# Patient Record
Sex: Female | Born: 1940 | Race: White | Hispanic: No | Marital: Married | Smoking: Never smoker
Health system: Southern US, Community
[De-identification: ages and names within clinical notes are randomized; demographics above are authoritative.]

## PROBLEM LIST (undated history)

## (undated) DIAGNOSIS — K219 Gastro-esophageal reflux disease without esophagitis: Secondary | ICD-10-CM

## (undated) DIAGNOSIS — F419 Anxiety disorder, unspecified: Secondary | ICD-10-CM

## (undated) DIAGNOSIS — D649 Anemia, unspecified: Secondary | ICD-10-CM

## (undated) DIAGNOSIS — I499 Cardiac arrhythmia, unspecified: Secondary | ICD-10-CM

## (undated) DIAGNOSIS — I2510 Atherosclerotic heart disease of native coronary artery without angina pectoris: Secondary | ICD-10-CM

## (undated) DIAGNOSIS — I10 Essential (primary) hypertension: Secondary | ICD-10-CM

## (undated) DIAGNOSIS — M109 Gout, unspecified: Secondary | ICD-10-CM

## (undated) DIAGNOSIS — G4730 Sleep apnea, unspecified: Secondary | ICD-10-CM

## (undated) DIAGNOSIS — C801 Malignant (primary) neoplasm, unspecified: Secondary | ICD-10-CM

## (undated) DIAGNOSIS — H9190 Unspecified hearing loss, unspecified ear: Secondary | ICD-10-CM

## (undated) DIAGNOSIS — Z9289 Personal history of other medical treatment: Secondary | ICD-10-CM

## (undated) HISTORY — PX: COLONOSCOPY: SHX174

## (undated) HISTORY — PX: ABDOMINAL HYSTERECTOMY: SHX81

## (undated) HISTORY — PX: EYE SURGERY: SHX253

## (undated) HISTORY — PX: CARPAL TUNNEL RELEASE: SHX101

## (undated) HISTORY — PX: CARDIAC CATHETERIZATION: SHX172

## (undated) HISTORY — PX: OTHER SURGICAL HISTORY: SHX169

---
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
## 1982-04-14 HISTORY — PX: TUBAL LIGATION: SHX77

## 1985-04-14 HISTORY — PX: CHOLECYSTECTOMY: SHX55

## 2007-01-11 HISTORY — PX: INCONTINENCE SURGERY: SHX676

## 2008-10-10 HISTORY — PX: DILATION AND CURETTAGE OF UTERUS: SHX78

## 2010-04-26 HISTORY — PX: OTHER SURGICAL HISTORY: SHX169

## 2011-08-19 HISTORY — PX: HYSTEROSCOPY WITH D & C: SHX1775

## 2012-01-27 HISTORY — PX: OTHER SURGICAL HISTORY: SHX169

## 2013-10-18 HISTORY — PX: JOINT REPLACEMENT: SHX530

## 2014-04-14 HISTORY — PX: UPPER GI ENDOSCOPY: SHX6162

## 2014-08-14 HISTORY — PX: BREAST SURGERY: SHX581

## 2015-01-02 HISTORY — PX: CORONARY ANGIOPLASTY: SHX604

## 2015-01-02 HISTORY — PX: OTHER SURGICAL HISTORY: SHX169

## 2016-10-05 HISTORY — PX: BREAST SURGERY: SHX581

## 2017-11-16 HISTORY — PX: NECK SURGERY: SHX720

## 2019-02-23 ENCOUNTER — Other Ambulatory Visit: Payer: Self-pay | Admitting: Neurological Surgery

## 2019-03-24 NOTE — Progress Notes (Signed)
[place], [place] [address] Phone: [phone] Fax: [phone]      Your procedure is scheduled on March 29, 2019.  Report to [facility] Main Entrance "A" at 5:30 A.M., and check in at the Admitting office.  Call this number if you have problems the morning of surgery:  [phone]  Call [phone] if you have any questions prior to your surgery date Monday-Friday 8am-4pm    Remember:  Do not eat or drink after midnight the night before your surgery   Take these medicines the morning of surgery with A SIP OF WATER: allopurinol (ZYLOPRIM) BYSTOLIC  omeprazole (PRILOSEC) sertraline (ZOLOFT)   As of today, STOP taking any Aspirin (unless otherwise instructed by your surgeon), Aleve, Naproxen, Ibuprofen, Motrin, Advil, Goody's, BC's, all herbal medications, fish oil, and all vitamins.  Follow your surgeon's instructions on when to stop Coumadin.  If no instructions were given by your surgeon then you will need to call the office to get those instructions.      The Morning of Surgery  Do not wear jewelry, make-up or nail polish.  Do not wear lotions, powders, or perfumes/colognes, or deodorant  Do not shave 48 hours prior to surgery.  Men may shave face and neck.  Do not bring valuables to the hospital.  [facility] is not responsible for any belongings or valuables.  If you are a smoker, DO NOT Smoke 24 hours prior to surgery  If you wear a CPAP at night please bring your mask, tubing, and machine the morning of surgery   Remember that you must have someone to transport you home after your surgery, and remain with you for 24 hours if you are discharged the same day.   Please bring cases for contacts, glasses, hearing aids, dentures or bridgework because it cannot be worn into surgery.    Leave your suitcase in the car.  After surgery it may be brought to your room.  For patients admitted to  the hospital, discharge time will be determined by your treatment team.  Patients discharged the day of surgery will not be allowed to drive home.    Special instructions:   [place]- Preparing For Surgery  Before surgery, you can play an important role. Because skin is not sterile, your skin needs to be as free of germs as possible. You can reduce the number of germs on your skin by washing with CHG (chlorahexidine gluconate) Soap before surgery.  CHG is an antiseptic cleaner which kills germs and bonds with the skin to continue killing germs even after washing.    Oral Hygiene is also important to reduce your risk of infection.  Remember - BRUSH YOUR TEETH THE MORNING OF SURGERY WITH YOUR REGULAR TOOTHPASTE  Please do not use if you have an allergy to CHG or antibacterial soaps. If your skin becomes reddened/irritated stop using the CHG.  Do not shave (including legs and underarms) for at least 48 hours prior to first CHG shower. It is OK to shave your face.  Please follow these instructions carefully.   1. Shower the NIGHT BEFORE SURGERY and the MORNING OF SURGERY with CHG Soap.   2. If you chose to wash your hair, wash your hair first as usual with your normal shampoo.  3. After you shampoo, rinse your hair and body thoroughly to remove the shampoo.  4. Use CHG as you would any other liquid soap. You  can apply CHG directly to the skin and wash gently with a scrungie or a clean washcloth.   5. Apply the CHG Soap to your body ONLY FROM THE NECK DOWN.  Do not use on open wounds or open sores. Avoid contact with your eyes, ears, mouth and genitals (private parts). Wash Face and genitals (private parts)  with your normal soap.   6. Wash thoroughly, paying special attention to the area where your surgery will be performed.  7. Thoroughly rinse your body with warm water from the neck down.  8. DO NOT shower/wash with your normal soap after using and rinsing off the CHG Soap.  9. Pat  yourself dry with a CLEAN TOWEL.  10. Wear CLEAN PAJAMAS to bed the night before surgery, wear comfortable clothes the morning of surgery  11. Place CLEAN SHEETS on your bed the night of your first shower and DO NOT SLEEP WITH PETS.    Day of Surgery:  Please shower the morning of surgery with the CHG soap Do not apply any deodorants/lotions. Please wear clean clothes to the hospital/surgery center.   Remember to brush your teeth WITH YOUR REGULAR TOOTHPASTE.   Please read over the following fact sheets that you were given.

## 2019-03-25 ENCOUNTER — Encounter (HOSPITAL_COMMUNITY)
Admission: RE | Admit: 2019-03-25 | Discharge: 2019-03-25 | Disposition: A | Discharge status: Home / Self Care | Payer: Medicare Other | Source: Ambulatory Visit | Attending: Neurological Surgery | Admitting: Neurological Surgery

## 2019-03-25 ENCOUNTER — Encounter (HOSPITAL_COMMUNITY): Payer: Self-pay

## 2019-03-25 ENCOUNTER — Other Ambulatory Visit (HOSPITAL_COMMUNITY)
Admission: RE | Admit: 2019-03-25 | Discharge: 2019-03-25 | Disposition: A | Discharge status: Home / Self Care | Payer: Medicare Other | Source: Ambulatory Visit | Attending: Neurological Surgery | Admitting: Neurological Surgery

## 2019-03-25 ENCOUNTER — Other Ambulatory Visit: Payer: Self-pay

## 2019-03-25 DIAGNOSIS — Z7901 Long term (current) use of anticoagulants: Secondary | ICD-10-CM | POA: Insufficient documentation

## 2019-03-25 DIAGNOSIS — Z853 Personal history of malignant neoplasm of breast: Secondary | ICD-10-CM | POA: Insufficient documentation

## 2019-03-25 DIAGNOSIS — Z79899 Other long term (current) drug therapy: Secondary | ICD-10-CM | POA: Insufficient documentation

## 2019-03-25 DIAGNOSIS — F419 Anxiety disorder, unspecified: Secondary | ICD-10-CM | POA: Insufficient documentation

## 2019-03-25 DIAGNOSIS — M5000 Cervical disc disorder with myelopathy, unspecified cervical region: Secondary | ICD-10-CM | POA: Insufficient documentation

## 2019-03-25 DIAGNOSIS — Z9049 Acquired absence of other specified parts of digestive tract: Secondary | ICD-10-CM | POA: Insufficient documentation

## 2019-03-25 DIAGNOSIS — Z01818 Encounter for other preprocedural examination: Secondary | ICD-10-CM | POA: Insufficient documentation

## 2019-03-25 DIAGNOSIS — M109 Gout, unspecified: Secondary | ICD-10-CM | POA: Diagnosis not present

## 2019-03-25 DIAGNOSIS — K219 Gastro-esophageal reflux disease without esophagitis: Secondary | ICD-10-CM | POA: Insufficient documentation

## 2019-03-25 DIAGNOSIS — I10 Essential (primary) hypertension: Secondary | ICD-10-CM | POA: Diagnosis not present

## 2019-03-25 DIAGNOSIS — I2510 Atherosclerotic heart disease of native coronary artery without angina pectoris: Secondary | ICD-10-CM | POA: Diagnosis not present

## 2019-03-25 DIAGNOSIS — Z20828 Contact with and (suspected) exposure to other viral communicable diseases: Secondary | ICD-10-CM | POA: Insufficient documentation

## 2019-03-25 DIAGNOSIS — G4730 Sleep apnea, unspecified: Secondary | ICD-10-CM | POA: Insufficient documentation

## 2019-03-25 DIAGNOSIS — Z90710 Acquired absence of both cervix and uterus: Secondary | ICD-10-CM | POA: Diagnosis not present

## 2019-03-25 DIAGNOSIS — Z9851 Tubal ligation status: Secondary | ICD-10-CM | POA: Diagnosis not present

## 2019-03-25 HISTORY — DX: Cardiac arrhythmia, unspecified: I49.9

## 2019-03-25 HISTORY — DX: Gastro-esophageal reflux disease without esophagitis: K21.9

## 2019-03-25 HISTORY — DX: Unspecified hearing loss, unspecified ear: H91.90

## 2019-03-25 HISTORY — DX: Sleep apnea, unspecified: G47.30

## 2019-03-25 HISTORY — DX: Malignant (primary) neoplasm, unspecified: C80.1

## 2019-03-25 HISTORY — DX: Anxiety disorder, unspecified: F41.9

## 2019-03-25 HISTORY — DX: Essential (primary) hypertension: I10

## 2019-03-25 HISTORY — DX: Gout, unspecified: M10.9

## 2019-03-25 HISTORY — DX: Personal history of other medical treatment: Z92.89

## 2019-03-25 LAB — TYPE AND SCREEN
ABO/RH(D): A NEG
Antibody Screen: NEGATIVE

## 2019-03-25 LAB — BASIC METABOLIC PANEL
Anion gap: 8 (ref 5–15)
BUN: 24 mg/dL — ABNORMAL HIGH (ref 8–23)
CO2: 29 mmol/L (ref 22–32)
Calcium: 8.9 mg/dL (ref 8.9–10.3)
Chloride: 106 mmol/L (ref 98–111)
Creatinine, Ser: 0.97 mg/dL (ref 0.44–1.00)
GFR calc Af Amer: >60 mL/min (ref >60)
GFR calc non Af Amer: 57 mL/min — ABNORMAL LOW (ref >60)
Glucose, Bld: 111 mg/dL — ABNORMAL HIGH (ref 70–99)
Potassium: 3.2 mmol/L — ABNORMAL LOW (ref 3.5–5.1)
Sodium: 143 mmol/L (ref 135–145)

## 2019-03-25 LAB — CBC
HCT: 35.1 % — ABNORMAL LOW (ref 36.0–46.0)
Hemoglobin: 11.3 g/dL — ABNORMAL LOW (ref 12.0–15.0)
MCH: 32.8 pg (ref 26.0–34.0)
MCHC: 32.2 g/dL (ref 30.0–36.0)
MCV: 102 fL — ABNORMAL HIGH (ref 80.0–100.0)
Platelets: 146 10*3/uL — ABNORMAL LOW (ref 150–400)
RBC: 3.44 MIL/uL — ABNORMAL LOW (ref 3.87–5.11)
RDW: 13.8 % (ref 11.5–15.5)
WBC: 5.6 10*3/uL (ref 4.0–10.5)
nRBC: 0 % (ref 0.0–0.2)

## 2019-03-25 LAB — SURGICAL PCR SCREEN
MRSA, PCR: NEGATIVE
Staphylococcus aureus: NEGATIVE

## 2019-03-25 LAB — GLUCOSE, CAPILLARY: Glucose-Capillary: 113 mg/dL — ABNORMAL HIGH (ref 70–99)

## 2019-03-25 LAB — ABO/RH: ABO/RH(D): A NEG

## 2019-03-25 NOTE — Progress Notes (Signed)
Patient denies shortness of breath, fever, cough and chest pain.  PCP - Dr Moshe Cipro Cardiologist -  Dr Carrolyn Leigh Pulmonology - Maudie Flakes, PA/ Dr Koleen Nimrod  Chest x-ray -  EKG - 03/25/19 [place] 10/03/16 [place], [place] Cardiac Cath - 01/02/2015 [facility]  Sleep Study - Yes.  [place], [place] CPAP -  Uses CPAP nightly, Bring CPAP with you on DOS.  Blood Thinner Instructions:  Follow your surgeon's instructions on when to stop prior to surgery.  Coumadin last dose on Thursday, 03/24/19.  Anesthesia review: Yes  Coronavirus Screening Have you experienced the following symptoms:  Cough yes/no: No Fever (>100.9F)  yes/no: No Runny nose yes/no: No Sore throat yes/no: No Difficulty breathing/shortness of breath  yes/no: No  Have you traveled in the last 14 days and where? yes/no: No  Patient verbalized understanding of instructions that were given to them at the PAT appointment.

## 2019-03-26 LAB — NOVEL CORONAVIRUS, NAA (HOSP ORDER, SEND-OUT TO REF LAB; TAT 18-24 HRS): SARS-CoV-2, NAA: NOT DETECTED

## 2019-03-28 NOTE — Anesthesia Preprocedure Evaluation (Addendum)
Anesthesia Evaluation  Patient identified by MRN, date of birth, ID band Patient awake    Reviewed: Allergy & Precautions, NPO status , Patient's Chart, lab work & pertinent test results, reviewed documented beta blocker date and time   History of Anesthesia Complications Negative for: history of anesthetic complications  Airway Mallampati: I  TM Distance: >3 FB Neck ROM: Full    Dental  (+) Teeth Intact, Dental Advisory Given   Pulmonary sleep apnea and Continuous Positive Airway Pressure Ventilation ,    Pulmonary exam normal breath sounds clear to auscultation       Cardiovascular hypertension, Pt. on home beta blockers (-) angina+ CAD and + Cardiac Stents (LAD-2016)  (-) Past MI Normal cardiovascular exam+ dysrhythmias Atrial Fibrillation  Rhythm:Regular Rate:Normal  CV: TTE 10/03/2016 (copy on patient chart): Description: 1.  Normal left ventricular systolic function with an ejection fraction of estimated 60%. 2.  Grossly normal left ventricular filling pattern and estimated left ventricular filling pressures at the time of study, however, left atrial dilatation is present.  This patient has a history of atrial fibrillation. 3.  Normal right ventricular size and systolic function with normal estimated right atrial pressure. 4.  Normal estimated right ventricular systolic pressure. 5.  Mitral, tricuspid, aortic, and pulmonic valves are all structurally normal with trivial mitral and tricuspid insufficiencies. 6.  Left atrial volume index is 35. 7.  No pericardial effusion.  No intracardiac mass/thrombus.   Neuro/Psych PSYCHIATRIC DISORDERS Anxiety Cervical myelopathy    GI/Hepatic Neg liver ROS, GERD  Medicated and Controlled,  Endo/Other  Obesity   Renal/GU negative Renal ROS     Musculoskeletal negative musculoskeletal ROS (+)   Abdominal   Peds  Hematology  (+) Blood dyscrasia (Thrombocytopenia--Plt 146k;  Coumadin), anemia ,   Anesthesia Other Findings Day of surgery medications reviewed with the patient.  Reproductive/Obstetrics                          Anesthesia Physical Anesthesia Plan  ASA: III  Anesthesia Plan: General   Post-op Pain Management:    Induction: Intravenous  PONV Risk Score and Plan: 3 and Dexamethasone, Ondansetron, Treatment may vary due to age or medical condition and TIVA  Airway Management Planned: Oral ETT  Additional Equipment: Arterial line  Intra-op Plan:   Post-operative Plan: Extubation in OR  Informed Consent: I have reviewed the patients History and Physical, chart, labs and discussed the procedure including the risks, benefits and alternatives for the proposed anesthesia with the patient or authorized representative who has indicated his/her understanding and acceptance.     Dental advisory given  Plan Discussed with: CRNA  Anesthesia Plan Comments: (See PAT note by Karoline Caldwell, PA-C  2nd PIV after induction  TIVA per surgeon request)    Anesthesia Quick Evaluation

## 2019-03-28 NOTE — Progress Notes (Signed)
Anesthesia Chart Review:  Case: [id number] Date/Time: 03/29/19 0715   Procedure: Cervical 5 to Cervical 7 Posterior cervical laminectomy and instrumented fusion (N/A ) - Cervical 5 to Cervical 7 Posterior cervical laminectomy and instrumented fusion   Anesthesia type: General   Pre-op diagnosis: Cervical myelopathy   Location: [facility] OR ROOM 19 / [place] OR   Surgeons: Judith Part, MD      DISCUSSION:  Follows with cardiologist Dr. Gibson Ramp in [place] for hx of CAD s/p stent to LAD 2016. Subsequent cath in 2017 showed patent stent.  Echo done 10/03/2016 showed EF 60%, no significant valvular abnormalities. OV notes requested x 3 without success. Only received procedure notes from multiple vein ablation procedures.  Pt has hx of afib followed by PCP, maintained on coumadin. Surgical clearance states pt is low risk and can hold warfarin 5d preop.  Called Dr. Colleen Can office to clarify acuity of surgery and was told that pt is myelopathic and having difficulty ambulating. Discussed pt with Dr. Glennon Mac. She advised that given pt's myelopathy, can proceed as planned with the understanding that pt will be evaluated by anesthesia on DOS and if she has any new/concerning CV symptoms may be cancelled.   VS: BP (!) 173/73   Pulse 66   Temp (!) 36.3 C (Oral)   Resp 18   Ht 5\' 7"  (1.702 m)   Wt 112.3 kg   LMP  (LMP Unknown)   SpO2 97%   BMI 38.77 kg/m   PROVIDERS: Moshe Cipro, MD is PCP  Carrolyn Leigh, MD is Cardiologist  LABS: Labs reviewed: Acceptable for surgery. (all labs ordered are listed, but only abnormal results are displayed)  Labs Reviewed  GLUCOSE, CAPILLARY - Abnormal; Notable for the following components:      Result Value   Glucose-Capillary 113 (*)    All other components within normal limits  CBC - Abnormal; Notable for the following components:   RBC 3.44 (*)    Hemoglobin 11.3 (*)    HCT 35.1 (*)    MCV 102.0 (*)    Platelets 146 (*)    All other components  within normal limits  BASIC METABOLIC PANEL - Abnormal; Notable for the following components:   Potassium 3.2 (*)    Glucose, Bld 111 (*)    BUN 24 (*)    GFR calc non Af Amer 57 (*)    All other components within normal limits  SURGICAL PCR SCREEN  TYPE AND SCREEN  ABO/RH      EKG: EKG 03/25/19: Normal sinus rhythm. Rate 65. Nonspecific ST and T wave abnormality  CV: TTE 10/03/2016 (copy on patient chart): Description: 1.  Normal left ventricular systolic function with an ejection fraction of estimated 60%. 2.  Grossly normal left ventricular filling pattern and estimated left ventricular filling pressures at the time of study, however, left atrial dilatation is present.  This patient has a history of atrial fibrillation. 3.  Normal right ventricular size and systolic function with normal estimated right atrial pressure. 4.  Normal estimated right ventricular systolic pressure. 5.  Mitral, tricuspid, aortic, and pulmonic valves are all structurally normal with trivial mitral and tricuspid insufficiencies. 6.  Left atrial volume index is 35. 7.  No pericardial effusion.  No intracardiac mass/thrombus.  R/L Cath 08/03/2015 (copy on chart): Findings: 1.  Successful ultrasound access to the left radial artery. 2.  Distal abdominal aortography reveals patent distal abdominal aorta.  No evidence of aneurysm.  There are patent bilateral iliac systems,  bilateral femoral systems, bilateral popliteal arteries, and three-vessel runoff to the feet bilaterally with no obstructive peripheral artery disease. 3.  Right heart catheterization. A.  Pulmonary capillary wedge pressure is 20 mmHg. B.  Pulmonary artery pressure is 38 mmHg/ 19 mmHg with a mean at 25 mmHg. C.  Right ventricular pressure is 34 mmHg with an end-diastolic pressure of 8 mmHg. D.  Right atrial pressure is 8 mmHg. E.  Cardiac output is 5.49 L/min. F.  Cardiac index is 2.56 L/min/m. 4.  RCA is a large dominant vessel, free of  disease. 5.  Left main coronary artery bifurcates, free of disease. 6.  LAD is a large vessel, wraps around and supplies the apex.  There is a patent stent in the proximal portion of the LAD.  In the midportion of the stent there is a small to moderate sized first diagonal vessel, that has a 90% ostial stenosis.  The vessel itself is between 1 mm and 1.5 mm in size. 7.  Circumflex is a large vessel, free of disease. Recommendations: Medical management.  Cath/PCI 01/02/2015 (copy on chart): Findings: 1.  Right coronary artery is a large vessel, free of angiographically significant disease. 2.  Left main coronary artery is a moderate-sized vessel, bifurcates, free of disease. 3.  LAD is a large vessel, wraps around, and supplies the apex.  There is a 10% to 20% stenosis in the very proximal portion of the vessel.  Just distal to this in the proximal portion of the LAD, there is an 80% stenosis.  This then gives off a small diagonal (less than 2 mm in size and has a 90% ostial stenosis).  The remainder of the LAD is free of angiographically significant disease. 4.  Circumflex is a large vessel with a 20% stenosis of the proximal portion of the vessel. 5.  LVEDP is elevated at 20 mmHg. 6.  No aortic stenosis on pullback across the aortic valve. 7.  PTCI of the LAD with a 3 x 15 mm resolute drug-eluting stent. 8.  Ultrasound access of the left radial artery. Recommendations: Triple therapy with aspirin, Coumadin, and Brilinta for 1 month; after which time, we discontinue the aspirin and continue Brilinta and Coumadin for a years time.  Past Medical History:  Diagnosis Date  . Anxiety   . Cancer [facility])    breast cancer right  . Dysrhythmia    A-Fib on coumadin  . GERD (gastroesophageal reflux disease)   . Gout   . 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 History of blood transfusion   . Hypertension   . Sleep apnea    uses CPAP nightly    Past Surgical History:  Procedure Laterality Date   . ABDOMINAL HYSTERECTOMY    . BREAST SURGERY Right 08/14/2014   lumpectomy/lynph nodes  . BREAST SURGERY Right 10/05/2016   mass removed  . CARDIAC CATHETERIZATION    . CARPAL TUNNEL RELEASE Bilateral 10/01/11, 12/02/11  . CHOLECYSTECTOMY  1987  . COLONOSCOPY    . DILATION AND CURETTAGE OF UTERUS  10/10/2008   hysteroscopy with biopsy  . EYE SURGERY Bilateral    removed cataracts  . HYSTEROSCOPY W/D&C  08/19/2011  . INCONTINENCE SURGERY  01/11/2007  . JOINT REPLACEMENT Right 10/18/2013  . laparoscopy bilateral salpingo-oophorectomy  01/27/2012  . NECK SURGERY  11/16/2017  . pci lad  01/02/2015   stent   . radiation breast canceer Right    from 12/08/14-01/12/15  . rotator cuff  Right 04/26/2010   repair  . TONSILLECTOMY  1954  . TUBAL LIGATION  1984  . UPPER GI ENDOSCOPY  2016    MEDICATIONS: . allopurinol (ZYLOPRIM) 300 MG tablet  . BYSTOLIC 20 MG TABS  . omeprazole (PRILOSEC) 20 MG capsule  . potassium chloride (MICRO-K) 10 MEQ CR capsule  . sertraline (ZOLOFT) 100 MG tablet  . torsemide (DEMADEX) 20 MG tablet  . Vitamin D, Ergocalciferol, (DRISDOL) 1.25 MG (50000 UT) CAPS capsule  . warfarin (COUMADIN) 2 MG tablet  . warfarin (COUMADIN) 3 MG tablet   No current facility-administered medications for this encounter.    Wynonia Musty [facility] Short Stay Center/Anesthesiology Phone [phone] 03/28/2019 4:05 PM

## 2019-03-29 ENCOUNTER — Inpatient Hospital Stay (HOSPITAL_COMMUNITY): Payer: Medicare Other | Admitting: Physician Assistant

## 2019-03-29 ENCOUNTER — Inpatient Hospital Stay (HOSPITAL_COMMUNITY)
Admission: RE | Admit: 2019-03-29 | Discharge: 2019-03-31 | DRG: 030 | Disposition: A | Discharge status: Home / Self Care | Payer: Medicare Other | Attending: Neurological Surgery | Admitting: Neurological Surgery

## 2019-03-29 ENCOUNTER — Encounter (HOSPITAL_COMMUNITY): Payer: Self-pay | Admitting: Neurological Surgery

## 2019-03-29 ENCOUNTER — Inpatient Hospital Stay (HOSPITAL_COMMUNITY): Payer: Medicare Other | Admitting: Anesthesiology

## 2019-03-29 ENCOUNTER — Other Ambulatory Visit: Payer: Self-pay

## 2019-03-29 ENCOUNTER — Encounter (HOSPITAL_COMMUNITY)
Admission: RE | Disposition: A | Discharge status: Home / Self Care | Payer: Self-pay | Source: Home / Self Care | Attending: Neurological Surgery

## 2019-03-29 ENCOUNTER — Inpatient Hospital Stay (HOSPITAL_COMMUNITY): Payer: Medicare Other

## 2019-03-29 DIAGNOSIS — G959 Disease of spinal cord, unspecified: Secondary | ICD-10-CM | POA: Diagnosis present

## 2019-03-29 DIAGNOSIS — Z853 Personal history of malignant neoplasm of breast: Secondary | ICD-10-CM | POA: Diagnosis not present

## 2019-03-29 DIAGNOSIS — K219 Gastro-esophageal reflux disease without esophagitis: Secondary | ICD-10-CM | POA: Diagnosis present

## 2019-03-29 DIAGNOSIS — Z79899 Other long term (current) drug therapy: Secondary | ICD-10-CM

## 2019-03-29 DIAGNOSIS — H9190 Unspecified hearing loss, unspecified ear: Secondary | ICD-10-CM | POA: Diagnosis present

## 2019-03-29 DIAGNOSIS — I4891 Unspecified atrial fibrillation: Secondary | ICD-10-CM | POA: Diagnosis present

## 2019-03-29 DIAGNOSIS — Z419 Encounter for procedure for purposes other than remedying health state, unspecified: Secondary | ICD-10-CM

## 2019-03-29 DIAGNOSIS — I10 Essential (primary) hypertension: Secondary | ICD-10-CM | POA: Diagnosis present

## 2019-03-29 DIAGNOSIS — Z20828 Contact with and (suspected) exposure to other viral communicable diseases: Secondary | ICD-10-CM | POA: Diagnosis present

## 2019-03-29 DIAGNOSIS — Z7901 Long term (current) use of anticoagulants: Secondary | ICD-10-CM | POA: Diagnosis not present

## 2019-03-29 DIAGNOSIS — M109 Gout, unspecified: Secondary | ICD-10-CM | POA: Diagnosis present

## 2019-03-29 HISTORY — PX: POSTERIOR CERVICAL FUSION/FORAMINOTOMY: SHX5038

## 2019-03-29 IMAGING — RF DG CERVICAL SPINE 2 OR 3 VIEWS
1 series · 2 of 2 positions shown · non-contrast
Comparison: None.

CLINICAL DATA: Portable imaging for C5 through C7 posterior fusion.

EXAM:
CERVICAL SPINE - 2-3 VIEW; DG C-ARM 1-60 MIN

[Series 1: run · 2 of 2 slices shown]
[im 1/2]
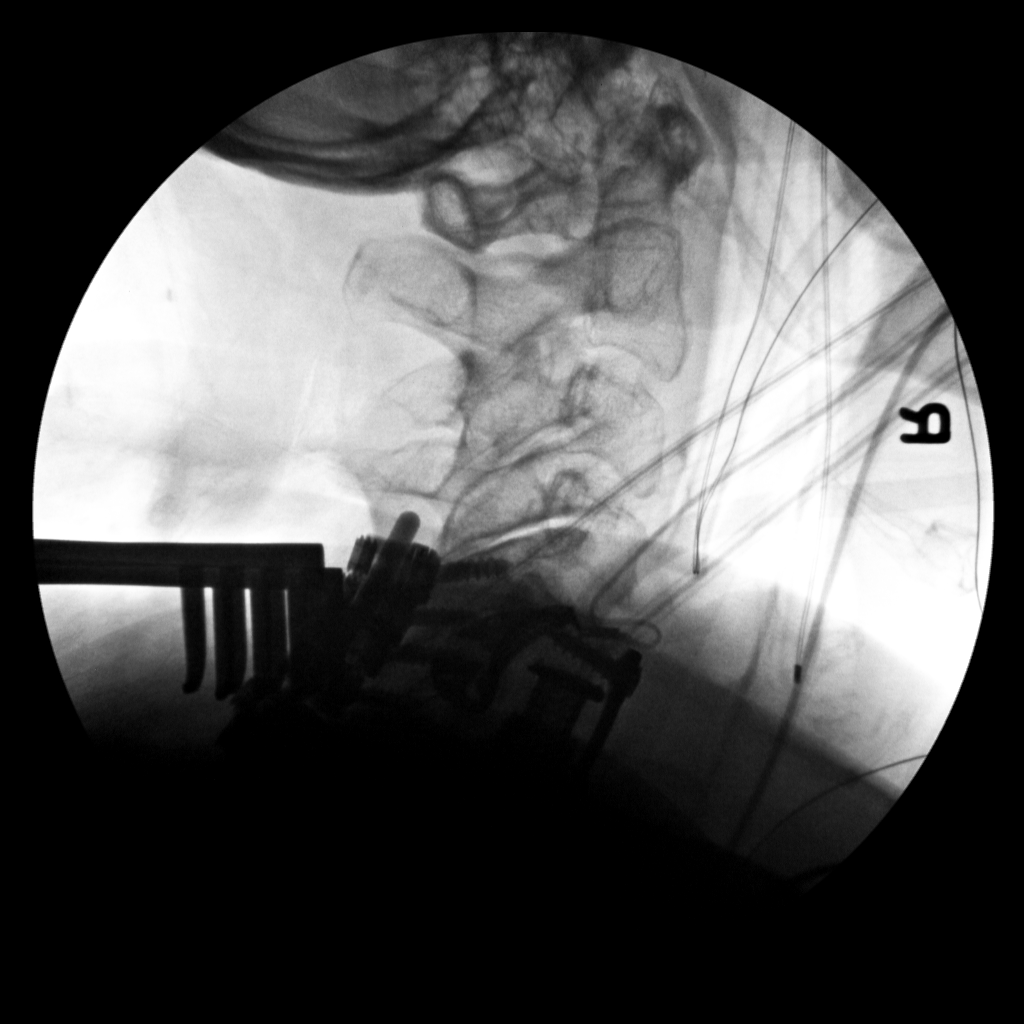
[im 2/2]
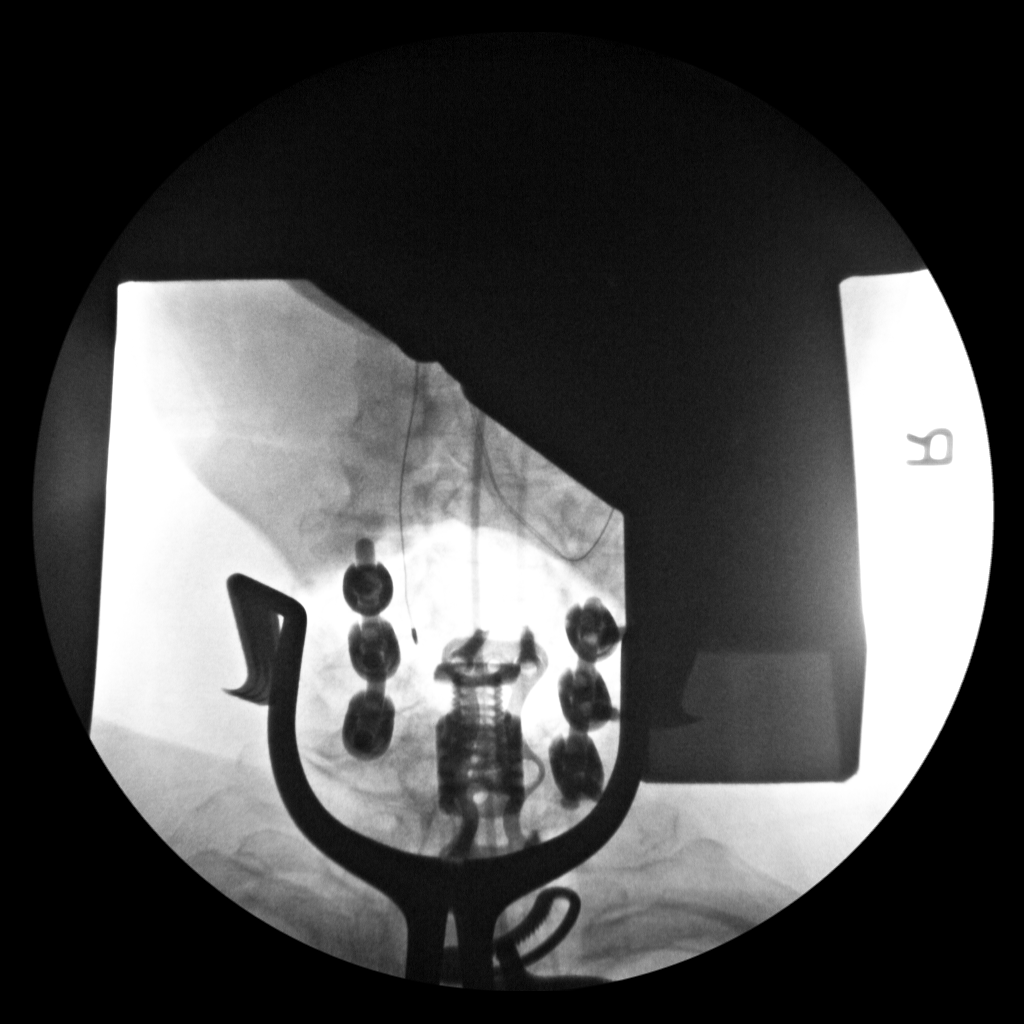

[2 of 2 positions shown; findings below may reference images not displayed]

FINDINGS: Two submitted images show placement pedicle screws with
interconnecting rods from C5-C7. There is also an anterior fusion
plate with an interbody device that is partly imaged, extending from
below C5.
IMPRESSION: Portable imaging provided for C5 through C7 posterior cervical
fusion.

## 2019-03-29 IMAGING — RF DG C-ARM 1-60 MIN
1 series · 2 of 2 positions shown · non-contrast
Comparison: None.

CLINICAL DATA: Portable imaging for C5 through C7 posterior fusion.

EXAM:
CERVICAL SPINE - 2-3 VIEW; DG C-ARM 1-60 MIN

[Series 1: run · 2 of 2 slices shown]
[im 1/2]
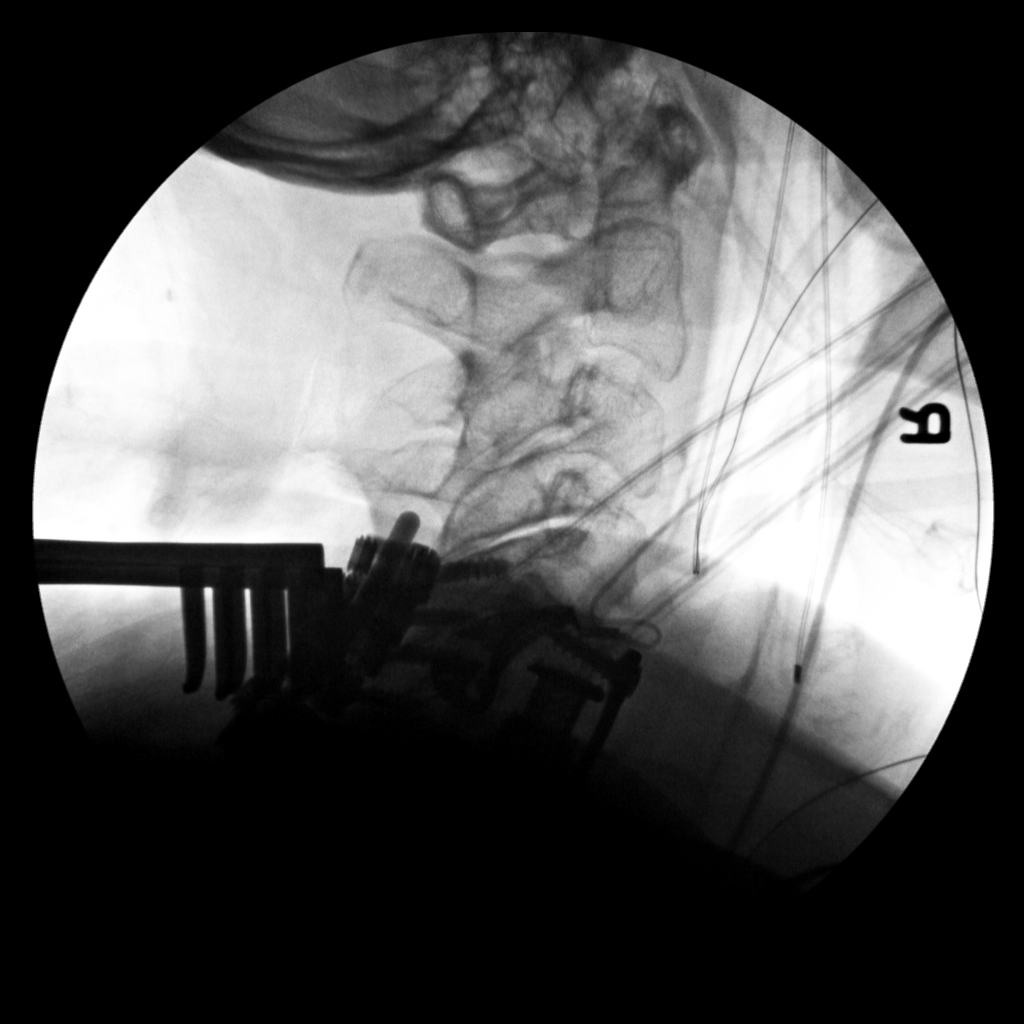
[im 2/2]
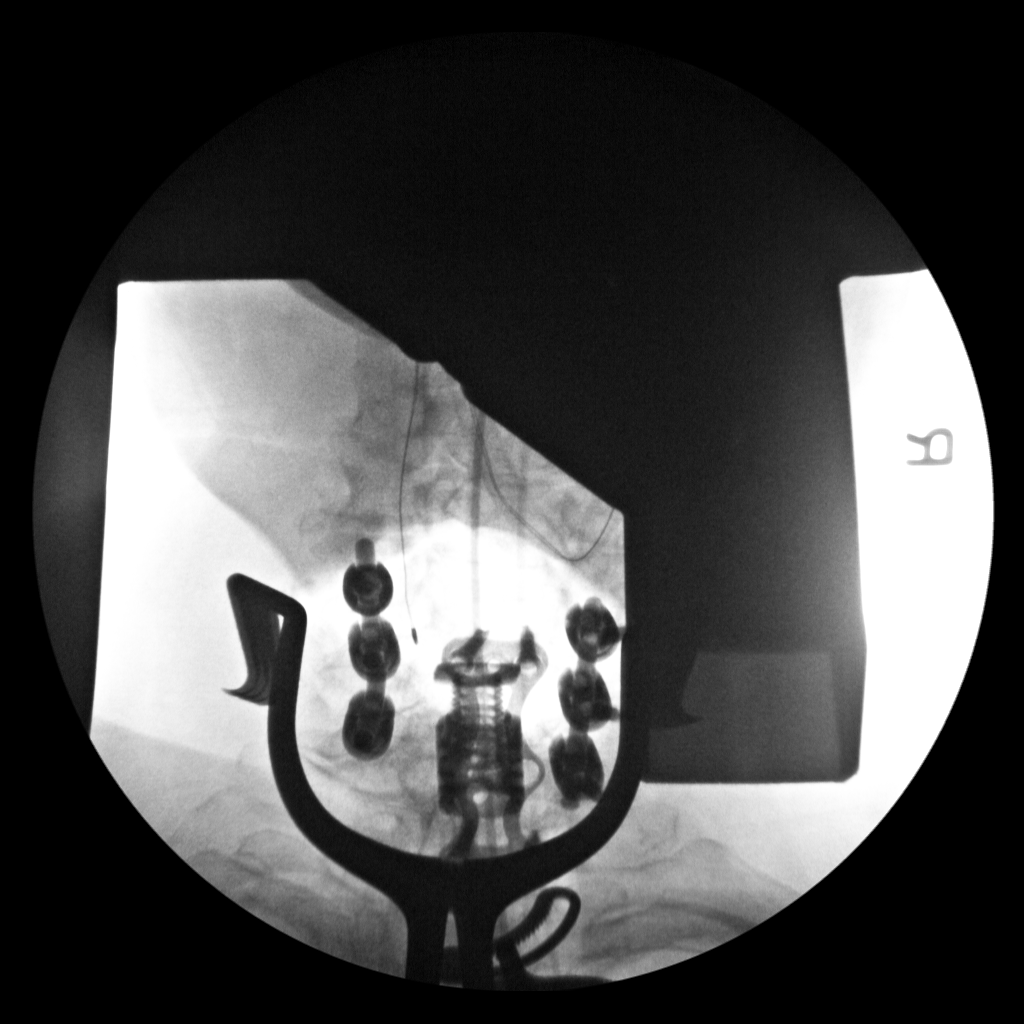

[2 of 2 positions shown; findings below may reference images not displayed]

FINDINGS: Two submitted images show placement pedicle screws with
interconnecting rods from C5-C7. There is also an anterior fusion
plate with an interbody device that is partly imaged, extending from
below C5.
IMPRESSION: Portable imaging provided for C5 through C7 posterior cervical
fusion.

## 2019-03-29 SURGERY — POSTERIOR CERVICAL FUSION/FORAMINOTOMY LEVEL 2
Anesthesia: General

## 2019-03-29 MED ORDER — SODIUM CHLORIDE 0.9% FLUSH
3.0000 mL | Freq: Two times a day (BID) | INTRAVENOUS | Status: DC
  Administered 2019-03-29: 3 mL via INTRAVENOUS

## 2019-03-29 MED ORDER — SODIUM CHLORIDE 0.9 % IV SOLN
0.0500 ug/kg/min | INTRAVENOUS | Status: AC
  Administered 2019-03-29: .15 ug/kg/min via INTRAVENOUS
  Filled 2019-03-29: qty 4000

## 2019-03-29 MED ORDER — DOCUSATE SODIUM 100 MG PO CAPS
100.0000 mg | ORAL_CAPSULE | Freq: Two times a day (BID) | ORAL | Status: DC
  Administered 2019-03-29 – 2019-03-31 (×5): 100 mg via ORAL
  Filled 2019-03-29 (×5): qty 1

## 2019-03-29 MED ORDER — EPHEDRINE SULFATE-NACL 50-0.9 MG/10ML-% IV SOSY
PREFILLED_SYRINGE | INTRAVENOUS | Status: DC | PRN
  Administered 2019-03-29: 15 mg via INTRAVENOUS
  Administered 2019-03-29: 10 mg via INTRAVENOUS

## 2019-03-29 MED ORDER — SERTRALINE HCL 50 MG PO TABS
100.0000 mg | ORAL_TABLET | Freq: Every day | ORAL | Status: DC
  Administered 2019-03-30 – 2019-03-31 (×2): 100 mg via ORAL
  Filled 2019-03-29 (×2): qty 2

## 2019-03-29 MED ORDER — CYCLOBENZAPRINE HCL 10 MG PO TABS
10.0000 mg | ORAL_TABLET | Freq: Three times a day (TID) | ORAL | Status: DC | PRN
  Administered 2019-03-29 – 2019-03-30 (×3): 10 mg via ORAL
  Filled 2019-03-29 (×4): qty 1

## 2019-03-29 MED ORDER — LIDOCAINE-EPINEPHRINE 1 %-1:100000 IJ SOLN
INTRAMUSCULAR | Status: AC
  Filled 2019-03-29: qty 1

## 2019-03-29 MED ORDER — LIDOCAINE 2% (20 MG/ML) 5 ML SYRINGE
INTRAMUSCULAR | Status: DC | PRN
  Administered 2019-03-29: 80 mg via INTRAVENOUS

## 2019-03-29 MED ORDER — MIDAZOLAM HCL 2 MG/2ML IJ SOLN
INTRAMUSCULAR | Status: DC | PRN
  Administered 2019-03-29 (×2): 1 mg via INTRAVENOUS

## 2019-03-29 MED ORDER — CHLORHEXIDINE GLUCONATE CLOTH 2 % EX PADS: 6.0000 | MEDICATED_PAD | Freq: Once | CUTANEOUS | Status: DC

## 2019-03-29 MED ORDER — ONDANSETRON HCL 4 MG/2ML IJ SOLN: 4.0000 mg | Freq: Once | INTRAMUSCULAR | Status: DC | PRN

## 2019-03-29 MED ORDER — PHENYLEPHRINE HCL-NACL 10-0.9 MG/250ML-% IV SOLN
INTRAVENOUS | Status: DC | PRN
  Administered 2019-03-29: 30 ug/min via INTRAVENOUS

## 2019-03-29 MED ORDER — ACETAMINOPHEN 325 MG PO TABS
650.0000 mg | ORAL_TABLET | ORAL | Status: DC | PRN
  Administered 2019-03-31: 650 mg via ORAL
  Filled 2019-03-29: qty 2

## 2019-03-29 MED ORDER — DEXAMETHASONE SODIUM PHOSPHATE 10 MG/ML IJ SOLN
INTRAMUSCULAR | Status: DC | PRN
  Administered 2019-03-29: 10 mg via INTRAVENOUS

## 2019-03-29 MED ORDER — 0.9 % SODIUM CHLORIDE (POUR BTL) OPTIME
TOPICAL | Status: DC | PRN
  Administered 2019-03-29: 09:00:00 1000 mL

## 2019-03-29 MED ORDER — VITAMIN D (ERGOCALCIFEROL) 1.25 MG (50000 UNIT) PO CAPS: 50000.0000 [IU] | ORAL_CAPSULE | ORAL | Status: DC

## 2019-03-29 MED ORDER — ONDANSETRON HCL 4 MG/2ML IJ SOLN
INTRAMUSCULAR | Status: DC | PRN
  Administered 2019-03-29: 4 mg via INTRAVENOUS

## 2019-03-29 MED ORDER — BACITRACIN ZINC 500 UNIT/GM EX OINT
TOPICAL_OINTMENT | CUTANEOUS | Status: DC | PRN
  Administered 2019-03-29: 1 via TOPICAL

## 2019-03-29 MED ORDER — LACTATED RINGERS IV SOLN: INTRAVENOUS | Status: DC | PRN

## 2019-03-29 MED ORDER — PROPOFOL 500 MG/50ML IV EMUL
INTRAVENOUS | Status: DC | PRN
  Administered 2019-03-29: 50 ug/kg/min via INTRAVENOUS

## 2019-03-29 MED ORDER — HYDROMORPHONE HCL 1 MG/ML IJ SOLN
INTRAMUSCULAR | Status: AC
  Filled 2019-03-29: qty 1

## 2019-03-29 MED ORDER — SODIUM CHLORIDE 0.9% FLUSH: 3.0000 mL | INTRAVENOUS | Status: DC | PRN

## 2019-03-29 MED ORDER — PROPOFOL 10 MG/ML IV BOLUS
INTRAVENOUS | Status: AC
  Filled 2019-03-29: qty 20

## 2019-03-29 MED ORDER — POLYETHYLENE GLYCOL 3350 17 G PO PACK
17.0000 g | PACK | Freq: Every day | ORAL | Status: DC | PRN
  Administered 2019-03-30: 17 g via ORAL
  Filled 2019-03-29: qty 1

## 2019-03-29 MED ORDER — MIDAZOLAM HCL 2 MG/2ML IJ SOLN
INTRAMUSCULAR | Status: AC
  Filled 2019-03-29: qty 2

## 2019-03-29 MED ORDER — ONDANSETRON HCL 4 MG/2ML IJ SOLN: 4.0000 mg | Freq: Four times a day (QID) | INTRAMUSCULAR | Status: DC | PRN

## 2019-03-29 MED ORDER — ACETAMINOPHEN 650 MG RE SUPP: 650.0000 mg | RECTAL | Status: DC | PRN

## 2019-03-29 MED ORDER — CEFAZOLIN SODIUM-DEXTROSE 2-4 GM/100ML-% IV SOLN
2.0000 g | Freq: Three times a day (TID) | INTRAVENOUS | Status: AC
  Administered 2019-03-29 (×2): 2 g via INTRAVENOUS
  Filled 2019-03-29 (×2): qty 100

## 2019-03-29 MED ORDER — POTASSIUM CHLORIDE CRYS ER 20 MEQ PO TBCR
10.0000 meq | EXTENDED_RELEASE_TABLET | Freq: Two times a day (BID) | ORAL | Status: DC
  Administered 2019-03-29 – 2019-03-31 (×5): 10 meq via ORAL
  Filled 2019-03-29 (×4): qty 1

## 2019-03-29 MED ORDER — ALLOPURINOL 300 MG PO TABS
300.0000 mg | ORAL_TABLET | Freq: Every day | ORAL | Status: DC
  Administered 2019-03-30 – 2019-03-31 (×2): 300 mg via ORAL
  Filled 2019-03-29 (×2): qty 1

## 2019-03-29 MED ORDER — FENTANYL CITRATE (PF) 250 MCG/5ML IJ SOLN
INTRAMUSCULAR | Status: AC
  Filled 2019-03-29: qty 5

## 2019-03-29 MED ORDER — SODIUM CHLORIDE 0.9 % IV SOLN
INTRAVENOUS | Status: DC | PRN
  Administered 2019-03-29: 500 mL

## 2019-03-29 MED ORDER — OXYCODONE HCL 5 MG PO TABS
5.0000 mg | ORAL_TABLET | ORAL | Status: DC | PRN
  Administered 2019-03-31: 5 mg via ORAL
  Filled 2019-03-29: qty 1

## 2019-03-29 MED ORDER — SUCCINYLCHOLINE CHLORIDE 20 MG/ML IJ SOLN
INTRAMUSCULAR | Status: DC | PRN
  Administered 2019-03-29: 120 mg via INTRAVENOUS

## 2019-03-29 MED ORDER — PHENOL 1.4 % MT LIQD: 1.0000 | OROMUCOSAL | Status: DC | PRN

## 2019-03-29 MED ORDER — FENTANYL CITRATE (PF) 100 MCG/2ML IJ SOLN: 25.0000 ug | INTRAMUSCULAR | Status: DC | PRN

## 2019-03-29 MED ORDER — ACETAMINOPHEN 500 MG PO TABS
1000.0000 mg | ORAL_TABLET | Freq: Once | ORAL | Status: AC
  Administered 2019-03-29: 1000 mg via ORAL
  Filled 2019-03-29: qty 2

## 2019-03-29 MED ORDER — SODIUM CHLORIDE 0.9 % IV SOLN: 250.0000 mL | INTRAVENOUS | Status: DC

## 2019-03-29 MED ORDER — THROMBIN 5000 UNITS EX SOLR
CUTANEOUS | Status: AC
  Filled 2019-03-29: qty 5000

## 2019-03-29 MED ORDER — TORSEMIDE 20 MG PO TABS
20.0000 mg | ORAL_TABLET | Freq: Every day | ORAL | Status: DC
  Administered 2019-03-29 – 2019-03-31 (×3): 20 mg via ORAL
  Filled 2019-03-29 (×3): qty 1

## 2019-03-29 MED ORDER — HYDROMORPHONE HCL 1 MG/ML IJ SOLN
1.0000 mg | INTRAMUSCULAR | Status: DC | PRN
  Administered 2019-03-29: 1 mg via INTRAVENOUS

## 2019-03-29 MED ORDER — PANTOPRAZOLE SODIUM 40 MG PO TBEC
40.0000 mg | DELAYED_RELEASE_TABLET | Freq: Every day | ORAL | Status: DC
  Administered 2019-03-30 – 2019-03-31 (×2): 40 mg via ORAL
  Filled 2019-03-29 (×2): qty 1

## 2019-03-29 MED ORDER — PROPOFOL 10 MG/ML IV BOLUS
INTRAVENOUS | Status: DC | PRN
  Administered 2019-03-29: 150 mg via INTRAVENOUS

## 2019-03-29 MED ORDER — BACITRACIN ZINC 500 UNIT/GM EX OINT
TOPICAL_OINTMENT | CUTANEOUS | Status: AC
  Filled 2019-03-29: qty 28.35

## 2019-03-29 MED ORDER — NEBIVOLOL HCL 10 MG PO TABS
10.0000 mg | ORAL_TABLET | Freq: Every day | ORAL | Status: DC
  Administered 2019-03-30 – 2019-03-31 (×2): 10 mg via ORAL
  Filled 2019-03-29 (×2): qty 1

## 2019-03-29 MED ORDER — MENTHOL 3 MG MT LOZG: 1.0000 | LOZENGE | OROMUCOSAL | Status: DC | PRN

## 2019-03-29 MED ORDER — OXYCODONE HCL 5 MG PO TABS
10.0000 mg | ORAL_TABLET | ORAL | Status: DC | PRN
  Administered 2019-03-29 – 2019-03-31 (×9): 10 mg via ORAL
  Filled 2019-03-29 (×9): qty 2

## 2019-03-29 MED ORDER — LIDOCAINE-EPINEPHRINE 1 %-1:100000 IJ SOLN
INTRAMUSCULAR | Status: DC | PRN
  Administered 2019-03-29: 9 mL

## 2019-03-29 MED ORDER — ONDANSETRON HCL 4 MG PO TABS: 4.0000 mg | ORAL_TABLET | Freq: Four times a day (QID) | ORAL | Status: DC | PRN

## 2019-03-29 MED ORDER — CEFAZOLIN SODIUM-DEXTROSE 2-4 GM/100ML-% IV SOLN
2.0000 g | INTRAVENOUS | Status: AC
  Administered 2019-03-29: 2 g via INTRAVENOUS
  Filled 2019-03-29: qty 100

## 2019-03-29 MED ORDER — THROMBIN 5000 UNITS EX SOLR
OROMUCOSAL | Status: DC | PRN
  Administered 2019-03-29: 09:00:00 5 mL via TOPICAL

## 2019-03-29 MED ORDER — FENTANYL CITRATE (PF) 250 MCG/5ML IJ SOLN
INTRAMUSCULAR | Status: DC | PRN
  Administered 2019-03-29: 100 ug via INTRAVENOUS
  Administered 2019-03-29 (×2): 50 ug via INTRAVENOUS

## 2019-03-29 SURGICAL SUPPLY — 59 items
BAG DECANTER FOR FLEXI CONT (MISCELLANEOUS) ×3 IMPLANT
BASKET BONE COLLECTION (BASKET) ×3 IMPLANT
BENZOIN TINCTURE PRP APPL 2/3 (GAUZE/BANDAGES/DRESSINGS) IMPLANT
BIT DRILL 2.4 (BIT) ×2 IMPLANT
BIT DRILL 2.4MM (BIT) ×1
BLADE CLIPPER SURG (BLADE) ×3 IMPLANT
BUR MATCHSTICK NEURO 3.0 LAGG (BURR) IMPLANT
BUR PRECISION FLUTE 5.0 (BURR) ×3 IMPLANT
CANISTER SUCT 3000ML PPV (MISCELLANEOUS) ×3 IMPLANT
CONT SPEC 4OZ CLIKSEAL STRL BL (MISCELLANEOUS) ×3 IMPLANT
COVER WAND RF STERILE (DRAPES) IMPLANT
DECANTER SPIKE VIAL GLASS SM (MISCELLANEOUS) ×3 IMPLANT
DERMABOND ADVANCED (GAUZE/BANDAGES/DRESSINGS) ×2
DERMABOND ADVANCED .7 DNX12 (GAUZE/BANDAGES/DRESSINGS) ×1 IMPLANT
DRAPE C-ARM 42X72 X-RAY (DRAPES) ×6 IMPLANT
DRAPE LAPAROTOMY 100X72 PEDS (DRAPES) ×3 IMPLANT
DURAPREP 6ML APPLICATOR 50/CS (WOUND CARE) ×3 IMPLANT
ELECT REM PT RETURN 9FT ADLT (ELECTROSURGICAL) ×3
ELECTRODE REM PT RTRN 9FT ADLT (ELECTROSURGICAL) ×1 IMPLANT
FEE INTRAOP MONITOR IMPULS NCS (MISCELLANEOUS) ×1 IMPLANT
GAUZE 4X4 16PLY RFD (DISPOSABLE) IMPLANT
GAUZE SPONGE 4X4 12PLY STRL (GAUZE/BANDAGES/DRESSINGS) IMPLANT
GLOVE BIO SURGEON STRL SZ7.5 (GLOVE) ×3 IMPLANT
GLOVE BIOGEL PI IND STRL 7.5 (GLOVE) ×1 IMPLANT
GLOVE BIOGEL PI INDICATOR 7.5 (GLOVE) ×2
GLOVE EXAM NITRILE LRG STRL (GLOVE) IMPLANT
GLOVE EXAM NITRILE XL STR (GLOVE) IMPLANT
GLOVE EXAM NITRILE XS STR PU (GLOVE) IMPLANT
GOWN STRL REUS W/ TWL LRG LVL3 (GOWN DISPOSABLE) IMPLANT
GOWN STRL REUS W/ TWL XL LVL3 (GOWN DISPOSABLE) ×1 IMPLANT
GOWN STRL REUS W/TWL 2XL LVL3 (GOWN DISPOSABLE) IMPLANT
GOWN STRL REUS W/TWL LRG LVL3 (GOWN DISPOSABLE)
GOWN STRL REUS W/TWL XL LVL3 (GOWN DISPOSABLE) ×2
HEMOSTAT POWDER KIT SURGIFOAM (HEMOSTASIS) ×3 IMPLANT
INTRAOP MONITOR FEE IMPULS NCS (MISCELLANEOUS) ×1
INTRAOP MONITOR FEE IMPULSE (MISCELLANEOUS) ×2
KIT BASIN OR (CUSTOM PROCEDURE TRAY) ×3 IMPLANT
KIT TURNOVER KIT B (KITS) ×3 IMPLANT
NEEDLE HYPO 22GX1.5 SAFETY (NEEDLE) ×3 IMPLANT
NS IRRIG 1000ML POUR BTL (IV SOLUTION) ×3 IMPLANT
PACK LAMINECTOMY NEURO (CUSTOM PROCEDURE TRAY) ×3 IMPLANT
PAD ARMBOARD 7.5X6 YLW CONV (MISCELLANEOUS) ×9 IMPLANT
PIN MAYFIELD SKULL DISP (PIN) ×3 IMPLANT
ROD PRE CUT 3.5X40MM SPINAL (Rod) ×6 IMPLANT
SCREW MA INFINITY 3.5X12 (Screw) ×18 IMPLANT
SCREW SET 3600315 STANDARD (Screw) ×18 IMPLANT
SCREW SET 3600315 STD (Screw) ×6 IMPLANT
SPONGE LAP 4X18 RFD (DISPOSABLE) IMPLANT
STAPLER VISISTAT 35W (STAPLE) ×3 IMPLANT
SUT ETHILON 3 0 FSL (SUTURE) IMPLANT
SUT MNCRL AB 3-0 PS2 18 (SUTURE) ×6 IMPLANT
SUT VIC AB 0 CT1 18XCR BRD8 (SUTURE) ×2 IMPLANT
SUT VIC AB 0 CT1 8-18 (SUTURE) ×4
SUT VIC AB 2-0 CP2 18 (SUTURE) ×3 IMPLANT
TOWEL GREEN STERILE (TOWEL DISPOSABLE) ×3 IMPLANT
TOWEL GREEN STERILE FF (TOWEL DISPOSABLE) ×3 IMPLANT
TRAY FOLEY MTR SLVR 16FR STAT (SET/KITS/TRAYS/PACK) ×3 IMPLANT
UNDERPAD 30X30 (UNDERPADS AND DIAPERS) ×3 IMPLANT
WATER STERILE IRR 1000ML POUR (IV SOLUTION) ×3 IMPLANT

## 2019-03-29 NOTE — Transfer of Care (Signed)
Immediate Anesthesia Transfer of Care Note  Patient: Natasha Lawson  Procedure(s) Performed: Cervical Five to Cervical Seven Posterior cervical laminectomy and instrumented fusion (N/A )  Patient Location: PACU  Anesthesia Type:General  Level of Consciousness: awake, alert , oriented and patient cooperative  Airway & Oxygen Therapy: Patient Spontanous Breathing and Patient connected to nasal cannula oxygen  Post-op Assessment: Report given to RN, Post -op Vital signs reviewed and stable and Patient moving all extremities X 4  Post vital signs: Reviewed and stable  Last Vitals:  Vitals Value Taken Time  BP 127/77 03/29/19 1042  Temp    Pulse 73 03/29/19 1046  Resp 16 03/29/19 1046  SpO2 97 % 03/29/19 1046  Vitals shown include unvalidated device data.  Last Pain:  Vitals:   03/29/19 0636  PainSc: 1       Patients Stated Pain Goal: 5 ([id number])  Complications: No apparent anesthesia complications

## 2019-03-29 NOTE — Anesthesia Procedure Notes (Signed)
Procedure Name: Intubation Date/Time: 03/29/2019 7:44 AM Performed by: Larene Beach, CRNA Pre-anesthesia Checklist: Patient identified, Emergency Drugs available, Suction available and Patient being monitored Patient Re-evaluated:Patient Re-evaluated prior to induction Oxygen Delivery Method: Circle system utilized Preoxygenation: Pre-oxygenation with 100% oxygen Induction Type: IV induction Ventilation: Mask ventilation without difficulty Laryngoscope Size: Glidescope and 3 Grade View: Grade I Tube type: Oral Tube size: 7.0 mm Number of attempts: 1 Airway Equipment and Method: Oral airway,  Video-laryngoscopy and Rigid stylet Placement Confirmation: ETT inserted through vocal cords under direct vision,  positive ETCO2 and breath sounds checked- equal and bilateral Secured at: 21 cm Tube secured with: Tape Dental Injury: Teeth and Oropharynx as per pre-operative assessment  Comments: Neck was maintained in neutral spine during intubation

## 2019-03-29 NOTE — Anesthesia Procedure Notes (Signed)
Arterial Line Insertion Start/End12/15/2020 7:03 AM Performed by: Josephine Igo, CRNA, CRNA  Patient location: Pre-op. Preanesthetic checklist: patient identified, IV checked, site marked, risks and benefits discussed, surgical consent, monitors and equipment checked, pre-op evaluation, timeout performed and anesthesia consent Lidocaine 1% used for infiltration Left, radial was placed Catheter size: 20 Fr Hand hygiene performed  and maximum sterile barriers used   Attempts: 1 Procedure performed without using ultrasound guided technique. Following insertion, dressing applied and Biopatch. Post procedure assessment: normal and unchanged  Patient tolerated the procedure well with no immediate complications.

## 2019-03-29 NOTE — Op Note (Signed)
PATIENT: Natasha Lawson  DAY OF SURGERY: 03/29/19   PRE-OPERATIVE DIAGNOSIS:  Cervical myelopathy   POST-OPERATIVE DIAGNOSIS:  Cervical myelopathy   PROCEDURE:  C5-C7 posterior cervical laminectomy and instrumented fusion   SURGEON:  Surgeon(s) and Role:    Judith Part, MD - Primary    Ashok Pall, MD - Assisting   ANESTHESIA: ETGA   BRIEF HISTORY: This is a 78 year old woman who presented to my clinic after having surgery roughly a year ago with another Psychologist, sport and exercise. 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 She presented to me with severe myelopathy and repeat imaging showed continue severe stenosis and post-op changes from her prior corpectomy. I therefore recommended posterior decompression and instrumented fusion to maximize her chance for recovery and prevent loss of function. This was discussed with the patient as well as risks, benefits, and alternatives and wished to proceed with surgery.   OPERATIVE DETAIL: The patient was taken to the operating room, anesthesia was induced by the anesthesia team, pre-flip SSEP/MEPs were obtained, the [place] head holder was applied, and placed on the OR table in the prone position. Post-flip MEPs/SSEPs were stable. A formal time out was performed with two patient identifiers and confirmed the operative site. The operative site was marked, hair was clipped with surgical clippers, the area was then prepped and draped in a sterile fashion. A linear midline incision was placed in the midline and dissection was performed to expose C5 to C7 posterior elements. Dr. Christella Noa scrubbed in to assist with exposure and assisted me during the procedure until the final stages of wound closure. Laminectomies were performed with a combination of high speed drill and rongeurs.  Instrumentation was then performed by placing lateral mass screws into the bilateral lateral masses from C5-C7 with good purchase. A rod was placed into the bilateral lateral  mass screws, checked with fluoro, then final tightened according to manufacturer torque specifications. The fusion surfaces were decorticated and the previously resected lamina and spinous processes were morselized and used as autograft.   All instrument and sponge counts were correct, hemostasis was confirmed, and the incision was then closed in layers. The patient was then returned to anesthesia for emergence. No apparent complications at the completion of the procedure.   EBL:  270mL   DRAINS: none   SPECIMENS: none   Judith Part, MD 03/29/19 10:37 AM

## 2019-03-29 NOTE — Brief Op Note (Signed)
03/29/2019  10:34 AM  PATIENT:  Natasha Lawson  78 y.o. female  PRE-OPERATIVE DIAGNOSIS:  Cervical myelopathy  POST-OPERATIVE DIAGNOSIS:  Cervical myelopathy  PROCEDURE:  Procedure(s) with comments: Cervical Five to Cervical Seven Posterior cervical laminectomy and instrumented fusion (N/A) - Cervical Five to Cervical Seven Posterior cervical laminectomy and instrumented fusion  SURGEON:  Surgeon(s) and Role:    * Zeth Buday, Joyice Faster, MD - Primary    * Ashok Pall, MD - Assisting  PHYSICIAN ASSISTANT:   ANESTHESIA:   general  EBL:  300 mL   BLOOD ADMINISTERED:none  DRAINS: none   LOCAL MEDICATIONS USED:  LIDOCAINE   SPECIMEN:  No Specimen  DISPOSITION OF SPECIMEN:  N/A  COUNTS:  YES  TOURNIQUET:  * No tourniquets in log *  DICTATION: .Note written in EPIC  PLAN OF CARE: Admit to inpatient   PATIENT DISPOSITION:  PACU - hemodynamically stable.   Delay start of Pharmacological VTE agent (>24hrs) due to surgical blood loss or risk of bleeding: yes

## 2019-03-29 NOTE — Evaluation (Signed)
Physical Therapy Evaluation Patient Details Name: Natasha Lawson MRN: [id number] DOB: 1940/10/04 Today's Date: 03/29/2019   History of Present Illness   This is a 78 year old woman who presented to my clinic after having surgery roughly a year ago with another Psychologist, sport and exercise. 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 She presented to me with severe myelopathy and repeat imaging showed continue severe stenosis and post-op changes from her prior corpectomy. Pt underwent C5-7 laminectomy and fusion.   Clinical Impression  Patient is s/p above surgery resulting in functional limitations due to the deficits listed below (see PT Problem List). Pt encouraged to have decreased discomfort BUE's and be able to grasp RW. Pt ambulated 20' with min A and RW, had increased knee flexion by end of gait but no knee buckling. Pt has history of frequent posterior falls. Practiced bkwd walking with RW. Discussed precautions and proper cervical posture.  Patient will benefit from skilled PT to increase their independence and safety with mobility to allow discharge to the venue listed below.       Follow Up Recommendations Home health PT;Supervision for mobility/OOB    Equipment Recommendations  None recommended by PT    Recommendations for Other Services       Precautions / Restrictions Precautions Precautions: Cervical Precaution Booklet Issued: Yes (comment) Precaution Comments: reviewed proper cervical posture Restrictions Weight Bearing Restrictions: No      Mobility  Bed Mobility Overal bed mobility: Needs Assistance Bed Mobility: Rolling;Sidelying to Sit;Sit to Sidelying Rolling: Min guard Sidelying to sit: Min assist     Sit to sidelying: Mod assist General bed mobility comments: vc's for keeping good neck position. Pt needed assist reaching to rail and min A to shoulder for trunk elevation to erect. Needed mod A for LE's back into bed  Transfers Overall transfer level: Needs  assistance Equipment used: Rolling walker (2 wheeled) Transfers: Sit to/from Stand Sit to Stand: Min assist;Mod assist         General transfer comment: mod A for first sit<>stand, min A second time  Ambulation/Gait Ambulation/Gait assistance: Min assist Gait Distance (Feet): 20 Feet Assistive device: Rolling walker (2 wheeled) Gait Pattern/deviations: Step-through pattern;Decreased stride length;Trunk flexed Gait velocity: decreased Gait velocity interpretation: <1.31 ft/sec, indicative of household ambulator General Gait Details: pt ambulated 4' fwd and back with RW, took seated rest break, ambulated 16' with fatigue at end of this bout, increased knee flexion end of ambulation but no buckling  Stairs            Wheelchair Mobility    Modified Rankin (Stroke Patients Only)       Balance Overall balance assessment: Needs assistance;History of Falls Sitting-balance support: No upper extremity supported;Feet supported Sitting balance-Leahy Scale: Good     Standing balance support: Bilateral upper extremity supported Standing balance-Leahy Scale: Poor Standing balance comment: reliant on UE support, posterior bias                             Pertinent Vitals/Pain Pain Assessment: Faces Faces Pain Scale: Hurts little more Pain Location: neck Pain Descriptors / Indicators: Aching Pain Intervention(s): Limited activity within patient's tolerance;Monitored during session    Home Living Family/patient expects to be discharged to:: Private residence Living Arrangements: Spouse/significant other Available Help at Discharge: Family;Available 24 hours/day Type of Home: House Home Access: Ramped entrance     Home Layout: One level Home Equipment: Walker - 2 wheels;Walker - 4 wheels;Adaptive equipment  Additional Comments: pt lives with her husband but he has back issues as well as a torn meniscus    Prior Function Level of Independence: Needs assistance    Gait / Transfers Assistance Needed: ambulated with rollator, frequent falls  ADL's / Homemaking Assistance Needed: husband assisted with ADL's as needed, pt could dress herself        Hand Dominance        Extremity/Trunk Assessment   Upper Extremity Assessment Upper Extremity Assessment: Defer to OT evaluation    Lower Extremity Assessment Lower Extremity Assessment: Generalized weakness    Cervical / Trunk Assessment Cervical / Trunk Assessment: Kyphotic  Communication   Communication: No difficulties  Cognition Arousal/Alertness: Awake/alert Behavior During Therapy: WFL for tasks assessed/performed Overall Cognitive Status: Within Functional Limits for tasks assessed                                        General Comments      Exercises     Assessment/Plan    PT Assessment Patient needs continued PT services  PT Problem List Decreased strength;Decreased activity tolerance;Decreased balance;Decreased mobility;Decreased coordination;Decreased knowledge of precautions;Pain;Obesity       PT Treatment Interventions DME instruction;Gait training;Functional mobility training;Therapeutic activities;Therapeutic exercise;Balance training;Patient/family education;Neuromuscular re-education;Stair training    PT Goals (Current goals can be found in the Care Plan section)  Acute Rehab PT Goals Patient Stated Goal: return home PT Goal Formulation: With patient Time For Goal Achievement: 04/05/19 Potential to Achieve Goals: Good    Frequency Min 5X/week   Barriers to discharge Decreased caregiver support needs to be supervision level    Co-evaluation               AM-PAC PT "6 Clicks" Mobility  Outcome Measure Help needed turning from your back to your side while in a flat bed without using bedrails?: A Little Help needed moving from lying on your back to sitting on the side of a flat bed without using bedrails?: A Little Help needed  moving to and from a bed to a chair (including a wheelchair)?: A Little Help needed standing up from a chair using your arms (e.g., wheelchair or bedside chair)?: A Little Help needed to walk in hospital room?: A Little Help needed climbing 3-5 steps with a railing? : A Lot 6 Click Score: 17    End of Session Equipment Utilized During Treatment: Gait belt Activity Tolerance: Patient tolerated treatment well Patient left: in bed;with call bell/phone within reach Nurse Communication: Mobility status PT Visit Diagnosis: Unsteadiness on feet (R26.81);Pain;Difficulty in walking, not elsewhere classified (R26.2);Muscle weakness (generalized) (M62.81) Pain - part of body: (neck)    Time: 1550-1620 PT Time Calculation (min) (ACUTE ONLY): 30 min   Charges:   PT Evaluation $PT Eval Moderate Complexity: 1 Mod PT Treatments $Gait Training: 8-22 mins        Leighton Roach, [place]  Pager [phone] Office [place] 03/29/2019, 4:43 PM

## 2019-03-29 NOTE — H&P (Signed)
Surgical H&P Update  HPI: 78 y.o. woman who presented to my clinic for BLE symptoms, but found to have progressive myelopathy. She previously had an attempted anterior cervical corpectomy and woke up with quadriplegia. Unfortunately, her imaging showed continued stenosis at the surgical level that has still not been addressed. No changes in health since she was last seen. Still having symptoms and wishes to proceed with surgery.  PMHx:  Past Medical History:  Diagnosis Date  . Anxiety   . Cancer [facility])    breast cancer right  . Dysrhythmia    A-Fib on coumadin  . GERD (gastroesophageal reflux disease)   . Gout   . 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 History of blood transfusion   . Hypertension   . Sleep apnea    uses CPAP nightly   FamHx: History reviewed. No pertinent family history. SocHx:  reports that she has never smoked. She has never used smokeless tobacco. She reports that she does not drink alcohol or use drugs.  Physical Exam: AOx3, PERRL, FS, TM  +Hoffman's b/l w/ sustained ankle clonus b/l, left greater than right spastic paresis  Assesment/Plan: 78 y.o. woman with cervical myelopathy, here for posterior cervical decompression and instrumented fusion. Risks, benefits, and alternatives discussed and the patient would like to continue with surgery.   -OR today -4NP post-op  Judith Part, MD 03/29/19 7:08 AM

## 2019-03-30 MED ORDER — SODIUM CHLORIDE 0.9 % IV BOLUS: 1000.0000 mL | Freq: Once | INTRAVENOUS | Status: DC

## 2019-03-30 NOTE — Anesthesia Postprocedure Evaluation (Signed)
Anesthesia Post Note  Patient: Natasha Lawson  Procedure(s) Performed: Cervical Five to Cervical Seven Posterior cervical laminectomy and instrumented fusion (N/A )     Patient location during evaluation: PACU Anesthesia Type: General Level of consciousness: awake and alert Pain management: pain level controlled Vital Signs Assessment: post-procedure vital signs reviewed and stable Respiratory status: spontaneous breathing, nonlabored ventilation and respiratory function stable Cardiovascular status: blood pressure returned to baseline and stable Postop Assessment: no apparent nausea or vomiting Anesthetic complications: no    Last Vitals:  Vitals:   03/30/19 0335 03/30/19 0755  BP: (!) 126/48 (!) 134/44  Pulse: 77 77  Resp: 20 19  Temp: 37.3 C 36.8 C  SpO2: 93% 94%    Last Pain:  Vitals:   03/30/19 0845  TempSrc:   PainSc: 7                  Catalina Gravel

## 2019-03-30 NOTE — Progress Notes (Signed)
Physical Therapy Treatment Patient Details Name: Natasha Lawson MRN: [id number] DOB: 27-Feb-1941 Today's Date: 03/30/2019    History of Present Illness Pt is a 78 yo female s/p C5-7 posterior cervical laminectomy and instrumented fusion. PMHx: previous neck sx.    PT Comments    Pt progressing well with post-op mobility. She was able to demonstrate transfers and ambulation with gross min guard assist and RW for support. Pt fatigued and with increased pain after ambulating in the hall, however overall appears to be improving. Pt was educated on precautions, positioning recommendations, appropriate activity progression, and car transfer. Will continue to follow.      Follow Up Recommendations  Home health PT;Supervision for mobility/OOB     Equipment Recommendations  None recommended by PT    Recommendations for Other Services       Precautions / Restrictions Precautions Precautions: Cervical Precaution Booklet Issued: Yes (comment) Precaution Comments: reviewed cervical precautions Required Braces or Orthoses: (No brace needed order) Restrictions Weight Bearing Restrictions: No    Mobility  Bed Mobility Overal bed mobility: Needs Assistance Bed Mobility: Sit to Supine;Sit to Sidelying       Sit to supine: Mod assist Sit to sidelying: Mod assist General bed mobility comments: Pt sitting up EOB when PT arrived.   Transfers Overall transfer level: Needs assistance Equipment used: Rolling walker (2 wheeled) Transfers: Sit to/from Stand Sit to Stand: Min guard         General transfer comment: No assist required but hands-on with gait belt utilized for safety.   Ambulation/Gait Ambulation/Gait assistance: Min guard Gait Distance (Feet): 100 Feet Assistive device: Rolling walker (2 wheeled) Gait Pattern/deviations: Step-through pattern;Decreased stride length;Trunk flexed Gait velocity: decreased Gait velocity interpretation: <1.31 ft/sec, indicative of household  ambulator General Gait Details: Pt was able to tolerate hallway ambulation, ~100 feet total. 1 standing rest break. VC's for improved posture, closer walker proximity, and forward gaze.    Stairs             Wheelchair Mobility    Modified Rankin (Stroke Patients Only)       Balance Overall balance assessment: Needs assistance;History of Falls Sitting-balance support: No upper extremity supported;Feet supported Sitting balance-Leahy Scale: Good     Standing balance support: Bilateral upper extremity supported Standing balance-Leahy Scale: Poor Standing balance comment: reliant on RW                            Cognition Arousal/Alertness: Awake/alert Behavior During Therapy: WFL for tasks assessed/performed Overall Cognitive Status: Within Functional Limits for tasks assessed                                        Exercises      General Comments        Pertinent Vitals/Pain Pain Assessment: Faces Pain Score: 6  Faces Pain Scale: Hurts even more Pain Location: neck Pain Descriptors / Indicators: Aching;Discomfort;Burning Pain Intervention(s): Limited activity within patient's tolerance;Monitored during session;Repositioned    Home Living Family/patient expects to be discharged to:: Private residence Living Arrangements: Spouse/significant other Available Help at Discharge: Family;Available 24 hours/day Type of Home: House Home Access: Ramped entrance   Home Layout: One level Home Equipment: Walker - 2 wheels;Walker - 4 wheels;Adaptive equipment Additional Comments: pt lives with her husband but he has back issues as well as a torn meniscus  Prior Function Level of Independence: Needs assistance  Gait / Transfers Assistance Needed: ambulated with rollator, frequent falls ADL's / Homemaking Assistance Needed: husband assisted with ADL's as needed, pt could dress herself     PT Goals (current goals can now be found in the  care plan section) Acute Rehab PT Goals Patient Stated Goal: return home PT Goal Formulation: With patient Time For Goal Achievement: 04/05/19 Potential to Achieve Goals: Good Progress towards PT goals: Progressing toward goals    Frequency    Min 5X/week      PT Plan Current plan remains appropriate    Co-evaluation              AM-PAC PT "6 Clicks" Mobility   Outcome Measure  Help needed turning from your back to your side while in a flat bed without using bedrails?: A Little Help needed moving from lying on your back to sitting on the side of a flat bed without using bedrails?: A Little Help needed moving to and from a bed to a chair (including a wheelchair)?: A Little Help needed standing up from a chair using your arms (e.g., wheelchair or bedside chair)?: A Little Help needed to walk in hospital room?: A Little Help needed climbing 3-5 steps with a railing? : A Lot 6 Click Score: 17    End of Session Equipment Utilized During Treatment: Gait belt Activity Tolerance: Patient tolerated treatment well Patient left: in bed;with call bell/phone within reach Nurse Communication: Mobility status PT Visit Diagnosis: Unsteadiness on feet (R26.81);Pain;Difficulty in walking, not elsewhere classified (R26.2);Muscle weakness (generalized) (M62.81) Pain - part of body: (neck)     Time: [id number] PT Time Calculation (min) (ACUTE ONLY): 18 min  Charges:  $Gait Training: 8-22 mins                     Natasha Lawson, PT, DPT Acute Rehabilitation Services Pager: [phone] Office: [phone]    Natasha Lawson 03/30/2019, 12:10 PM

## 2019-03-30 NOTE — Discharge Summary (Signed)
Discharge Summary  Date of Admission: 03/29/2019  Date of Discharge: 03/30/19  Attending Physician: Emelda Brothers, MD  Hospital Course: Patient was admitted following an uncomplicated posterior cervical C5-7 decompression and instrumented fusion. She was recovered in PACU and transferred to [facility]. Post-operatively, Natasha Lawson neuro exam was at Natasha Lawson baseline 2/2 Natasha Lawson prior cord injury, but she had improved balance/ambulation and Natasha Lawson BLE dysesthetic pain improved. Natasha Lawson hospital course was uncomplicated and the patient was discharged home on 03/31/2019. She will follow up in clinic with me in 2-4 weeks.  Neurologic exam at discharge:  AOx3, PERRL, EOMI, FS, TM Strength 4/5 x4, BUE>BLE diffuse numbness, +b/l hoffman's/clonus   Discharge diagnosis: Cervical myelopathy  Natasha Part, MD 03/30/19 10:43 AM

## 2019-03-30 NOTE — Progress Notes (Signed)
Neurosurgery Service Progress Note  Subjective: No acute events overnight, dysesthetic lower extremity pain is improved, balance is better, no new complaints besides expected neck pain   Objective: Vitals:   03/29/19 1946 03/29/19 2314 03/30/19 0335 03/30/19 0755  BP: (!) 130/51 (!) 134/54 (!) 126/48 (!) 134/44  Pulse: 80 78 77 77  Resp: 20 20 20 19   Temp: 98.2 F (36.8 C) 98.5 F (36.9 C) 99.1 F (37.3 C) 98.3 F (36.8 C)  TempSrc: Oral Oral Oral Oral  SpO2: 92% 93% 93% 94%  Weight:      Height:       Temp (24hrs), Avg:98.3 F (36.8 C), Min:97.9 F (36.6 C), Max:99.1 F (37.3 C)  CBC Latest Ref Rng & Units 03/25/2019  WBC 4.0 - 10.5 K/uL 5.6  Hemoglobin 12.0 - 15.0 g/dL 11.3(L)  Hematocrit 36.0 - 46.0 % 35.1(L)  Platelets 150 - 400 K/uL 146(L)   BMP Latest Ref Rng & Units 03/25/2019  Glucose 70 - 99 mg/dL 111(H)  BUN 8 - 23 mg/dL 24(H)  Creatinine 0.44 - 1.00 mg/dL 0.97  Sodium 135 - 145 mmol/L 143  Potassium 3.5 - 5.1 mmol/L 3.2(L)  Chloride 98 - 111 mmol/L 106  CO2 22 - 32 mmol/L 29  Calcium 8.9 - 10.3 mg/dL 8.9    Intake/Output Summary (Last 24 hours) at 03/30/2019 0935 Last data filed at 03/30/2019 0745 Gross per 24 hour  Intake 1943 ml  Output 1925 ml  Net 18 ml    Current Facility-Administered Medications:  .  0.9 %  sodium chloride infusion, 250 mL, Intravenous, Continuous, Zainab Crumrine A, MD .  acetaminophen (TYLENOL) tablet 650 mg, 650 mg, Oral, Q4H PRN **OR** acetaminophen (TYLENOL) suppository 650 mg, 650 mg, Rectal, Q4H PRN, Judith Part, MD .  allopurinol (ZYLOPRIM) tablet 300 mg, 300 mg, Oral, Daily, Kaylen Motl A, MD .  cyclobenzaprine (FLEXERIL) tablet 10 mg, 10 mg, Oral, TID PRN, Judith Part, MD, 10 mg at 03/30/19 0750 .  docusate sodium (COLACE) capsule 100 mg, 100 mg, Oral, BID, Judith Part, MD, 100 mg at 03/29/19 2205 .  HYDROmorphone (DILAUDID) injection 1 mg, 1 mg, Intravenous, Q3H PRN, Judith Part, MD, 1 mg at 03/29/19 1250 .  menthol-cetylpyridinium (CEPACOL) lozenge 3 mg, 1 lozenge, Oral, PRN **OR** phenol (CHLORASEPTIC) mouth spray 1 spray, 1 spray, Mouth/Throat, PRN, Darus Hershman A, MD .  nebivolol (BYSTOLIC) tablet 10 mg, 10 mg, Oral, Daily, Floyce Bujak A, MD .  ondansetron (ZOFRAN) tablet 4 mg, 4 mg, Oral, Q6H PRN **OR** ondansetron (ZOFRAN) injection 4 mg, 4 mg, Intravenous, Q6H PRN, Nanetta Wiegman A, MD .  oxyCODONE (Oxy IR/ROXICODONE) immediate release tablet 10 mg, 10 mg, Oral, Q4H PRN, Judith Part, MD, 10 mg at 03/30/19 0749 .  oxyCODONE (Oxy IR/ROXICODONE) immediate release tablet 5 mg, 5 mg, Oral, Q4H PRN, Hallis Meditz A, MD .  pantoprazole (PROTONIX) EC tablet 40 mg, 40 mg, Oral, Daily, Tahira Olivarez A, MD .  polyethylene glycol (MIRALAX / GLYCOLAX) packet 17 g, 17 g, Oral, Daily PRN, Aadin Gaut A, MD .  potassium chloride SA (KLOR-CON) CR tablet 10 mEq, 10 mEq, Oral, BID, Judith Part, MD, 10 mEq at 03/29/19 2205 .  sertraline (ZOLOFT) tablet 100 mg, 100 mg, Oral, Daily, Michelle Vanhise A, MD .  sodium chloride flush (NS) 0.9 % injection 3 mL, 3 mL, Intravenous, Q12H, Katria Botts, Joyice Faster, MD, 3 mL at 03/29/19 2210 .  sodium chloride flush (NS) 0.9 % injection  3 mL, 3 mL, Intravenous, PRN, Judith Part, MD .  torsemide (DEMADEX) tablet 20 mg, 20 mg, Oral, Daily, Annaleigha Woo A, MD, 20 mg at 03/29/19 1444 .  [START ON 04/04/2019] Vitamin D (Ergocalciferol) (DRISDOL) capsule 50,000 Units, 50,000 Units, Oral, Weekly, Cove Haydon, Joyice Faster, MD   Physical Exam: AOx3, PERRL, EOMI, FS, Strength diffusely 4/5 in BUE, +b/l hoffman's, diffuse mild numbness Incision c/d/i  Assessment & Plan: 78 y.o. 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  -dysesthetic pain  resolution is uncommon, very happy that she received this much benefit this early -continue current pain regimen, PT/OT, PT rec'd home PT -possible discharge tomorrow depending on pain -SCDs/TEDs, hold SQH until tomorrow  Judith Part  03/30/19 9:35 AM

## 2019-03-30 NOTE — Discharge Instructions (Addendum)
Discharge Instructions  No restriction in activities, slowly increase your activity back to normal.   Your incision is closed with dermabond (purple glue). This will naturally fall off over the next 1-2 weeks.   Okay to shower on the day of discharge. Use regular soap and water and try to be gentle when cleaning your incision.   Follow up with Dr. Zada Finders in 2 weeks after discharge. If you do not already have a discharge appointment, please call his office at [phone] to schedule a follow up appointment. If you have any concerns or questions, please call the office and let us know.  You can restart taking your warfarin on 04/03/19.

## 2019-03-30 NOTE — Evaluation (Addendum)
Occupational Therapy Evaluation Patient Details Name: Natasha Lawson MRN: [id number] DOB: December 19, 1940 Today's Date: 03/30/2019    History of Present Illness Pt is a 78 yo female s/p C5-7 posterior cervical laminectomy and instrumented fusion. PMHx: previous neck sx.   Clinical Impression   Pt PTA: living with spouse who also has back injuries. Pt reports being independent with her hip kit at home. Pt currently performing ADL functional mobility with minguardA; transfers with minguardA and momentum for sit to stand. Pt modA overall for bed mobility to manipulate BLEs into/out of bed. Pt limited by pain and arms feeling sore. Pt has hip kit at home and plans to resume using it for lower body self care. Pt was already dressed before OTR got into room so pt denied need to go over dressing again. Verbal discussion of use of AE and hip hike or crossing legs into figure 4 to accomplish lower body self care. Back handout provided and reviewed ADL in detail. Pt educated on: setting an alarm at night for medication, avoid sitting for long periods of time, correct bed positioning for sleeping, correct sequence for bed mobility, avoiding lifting more than 5 pounds and never wash directly over incision. All education is complete and patient indicates understanding. OT following acutely for LB dressing and BUE HEP to assist with [facility].      Follow Up Recommendations  Home health OT;Supervision - Intermittent    Equipment Recommendations  None recommended by OT    Recommendations for Other Services       Precautions / Restrictions Precautions Precautions: Cervical Precaution Comments: reviewed cervical precautions Restrictions Weight Bearing Restrictions: No      Mobility Bed Mobility Overal bed mobility: Needs Assistance Bed Mobility: Sit to Supine;Sit to Sidelying       Sit to supine: Mod assist Sit to sidelying: Mod assist General bed mobility comments: verbal cues for log roll technique and  for assist with moving BLEs.  Transfers Overall transfer level: Needs assistance Equipment used: Rolling walker (2 wheeled) Transfers: Sit to/from Stand Sit to Stand: Min guard         General transfer comment: increased time and momentum required    Balance Overall balance assessment: Needs assistance;History of Falls Sitting-balance support: No upper extremity supported;Feet supported Sitting balance-Leahy Scale: Good     Standing balance support: Bilateral upper extremity supported Standing balance-Leahy Scale: Poor Standing balance comment: reliant on RW                           ADL either performed or assessed with clinical judgement   ADL Overall ADL's : Needs assistance/impaired Eating/Feeding: Set up;Sitting Eating/Feeding Details (indicate cue type and reason): opening containers; arms are sore Grooming: Supervision/safety;Set up;Standing Grooming Details (indicate cue type and reason): "my neck is burning right now." Upper Body Bathing: Supervision/ safety   Lower Body Bathing: Minimal assistance;Sitting/lateral leans;Sit to/from stand;Cueing for safety Lower Body Bathing Details (indicate cue type and reason): pt has AE at home to assist Upper Body Dressing : Set up;Sitting;Cueing for safety   Lower Body Dressing: Minimal assistance;Sitting/lateral leans;Sit to/from stand;Cueing for safety;With adaptive equipment   Toilet Transfer: Min guard;RW   Toileting- Clothing Manipulation and Hygiene: Minimal assistance;Sitting/lateral lean;Sit to/from stand;Cueing for safety       Functional mobility during ADLs: Min guard;Rolling walker;Cueing for safety General ADL Comments: Pt limited by pain and arms feeling sore. Pt has hip kit at home and plans to resume using it.  Pt was already dressed before OTR got into room so pt denied need to go over dressing again. Verbal discussion of use of AE and hip hike or crossing legs into figure 4 to accomplish lower  body self care.     Vision Baseline Vision/History: No visual deficits Patient Visual Report: No change from baseline Vision Assessment?: No apparent visual deficits     Perception     Praxis      Pertinent Vitals/Pain Pain Assessment: 0-10 Pain Score: 6  Pain Location: neck Pain Descriptors / Indicators: Aching;Discomfort;Burning Pain Intervention(s): Limited activity within patient's tolerance;Monitored during session;Ice applied     Hand Dominance     Extremity/Trunk Assessment Upper Extremity Assessment Upper Extremity Assessment: Generalized weakness;RUE deficits/detail;LUE deficits/detail RUE Deficits / Details: 60* AROM all below the shoulder movements RUE Coordination: decreased fine motor LUE Deficits / Details: 60* AROM all below the shoulder movements LUE Coordination: decreased fine motor   Lower Extremity Assessment Lower Extremity Assessment: Defer to PT evaluation;Generalized weakness   Cervical / Trunk Assessment Cervical / Trunk Assessment: Kyphotic;Other exceptions Cervical / Trunk Exceptions: head forward and rounded shoulders; s/p neck sx   Communication Communication Communication: No difficulties   Cognition Arousal/Alertness: Awake/alert Behavior During Therapy: WFL for tasks assessed/performed Overall Cognitive Status: Within Functional Limits for tasks assessed                                     General Comments       Exercises     Shoulder Instructions      Home Living Family/patient expects to be discharged to:: Private residence Living Arrangements: Spouse/significant other Available Help at Discharge: Family;Available 24 hours/day Type of Home: House Home Access: Ramped entrance     Home Layout: One level     Bathroom Shower/Tub: Occupational psychologist: Standard     Home Equipment: Environmental consultant - 2 wheels;Walker - 4 wheels;Adaptive equipment Adaptive Equipment: Reacher;Sock aid;Long-handled shoe  horn;Long-handled sponge Additional Comments: pt lives with her husband but he has back issues as well as a torn meniscus      Prior Functioning/Environment Level of Independence: Needs assistance  Gait / Transfers Assistance Needed: ambulated with rollator, frequent falls ADL's / Homemaking Assistance Needed: husband assisted with ADL's as needed, pt could dress herself            OT Problem List: Decreased strength;Decreased activity tolerance;Impaired balance (sitting and/or standing);Decreased coordination;Decreased safety awareness;Impaired UE functional use;Pain      OT Treatment/Interventions: Self-care/ADL training;Energy conservation;DME and/or AE instruction;Therapeutic activities;Balance training;Patient/family education;Therapeutic exercise;Neuromuscular education    OT Goals(Current goals can be found in the care plan section) Acute Rehab OT Goals Patient Stated Goal: return home OT Goal Formulation: With patient Time For Goal Achievement: 04/13/19 Potential to Achieve Goals: Good ADL Goals Pt Will Perform Grooming: standing;with modified independence Pt Will Perform Lower Body Dressing: with supervision;with adaptive equipment;sitting/lateral leans;sit to/from stand Pt Will Perform Toileting - Clothing Manipulation and hygiene: with supervision;sit to/from stand;sitting/lateral leans Pt/caregiver will Perform Home Exercise Program: Increased strength;Both right and left upper extremity  OT Frequency: Min 2X/week   Barriers to D/C:            Co-evaluation              AM-PAC OT "6 Clicks" Daily Activity     Outcome Measure Help from another person eating meals?: A Little Help from another person  taking care of personal grooming?: A Little Help from another person toileting, which includes using toliet, bedpan, or urinal?: A Little Help from another person bathing (including washing, rinsing, drying)?: A Lot Help from another person to put on and taking  off regular upper body clothing?: A Little Help from another person to put on and taking off regular lower body clothing?: A Lot 6 Click Score: 16   End of Session Nurse Communication: Mobility status  Activity Tolerance: Patient limited by pain;Patient tolerated treatment well Patient left: in chair;with call bell/phone within reach  OT Visit Diagnosis: Muscle weakness (generalized) (M62.81);Pain Pain - part of body: (neck)                Time: 1219-7588 OT Time Calculation (min): 36 min Charges:  OT General Charges $OT Visit: 1 Visit OT Evaluation $OT Eval Moderate Complexity: 1 Mod OT Treatments $Self Care/Home Management : 8-22 mins  [place] Pager: [phone] Office: [phone]  Jefferey Pica. 03/30/2019, 9:51 AM

## 2019-03-31 ENCOUNTER — Encounter: Payer: Self-pay | Admitting: *Deleted

## 2019-03-31 MED ORDER — WARFARIN SODIUM 3 MG PO TABS: 3.0000 mg | ORAL_TABLET | ORAL | Status: DC

## 2019-03-31 MED ORDER — WARFARIN SODIUM 2 MG PO TABS: 2.0000 mg | ORAL_TABLET | ORAL | Status: DC

## 2019-03-31 MED ORDER — CYCLOBENZAPRINE HCL 10 MG PO TABS: 10.0000 mg | ORAL_TABLET | Freq: Three times a day (TID) | ORAL | 0 refills | Status: DC | PRN

## 2019-03-31 MED ORDER — OXYCODONE HCL 5 MG PO TABS: 5.0000 mg | ORAL_TABLET | ORAL | 0 refills | Status: DC | PRN

## 2019-03-31 NOTE — Progress Notes (Signed)
Physical Therapy Treatment Patient Details Name: Natasha Lawson MRN: [id number] DOB: Aug 26, 1940 Today's Date: 03/31/2019    History of Present Illness Pt is a 78 yo female s/p C5-7 posterior cervical laminectomy and instrumented fusion. PMHx: previous neck sx.    PT Comments    Pt progressing well with post-op mobility. She was able to demonstrate transfers and ambulation with gross min guard assist up to mod assist and RW for support. Pt was educated on precautions, brace application/wearing schedule for soft collar, appropriate activity progression, and car transfer. Will continue to follow.      Follow Up Recommendations  Home health PT;Supervision for mobility/OOB     Equipment Recommendations  None recommended by PT    Recommendations for Other Services       Precautions / Restrictions Precautions Precautions: Cervical Precaution Booklet Issued: Yes (comment) Precaution Comments: reviewed cervical precautions Required Braces or Orthoses: (no brace needed per order, soft collar given for comfort) Restrictions Weight Bearing Restrictions: No    Mobility  Bed Mobility Overal bed mobility: Needs Assistance Bed Mobility: Sidelying to Sit;Sit to Sidelying;Rolling Rolling: Min guard Sidelying to sit: Min guard;HOB elevated     Sit to sidelying: Mod assist;HOB elevated General bed mobility comments: Assist for LE elevation back up into bed at end of session.   Transfers Overall transfer level: Needs assistance Equipment used: Rolling walker (2 wheeled) Transfers: Sit to/from Stand Sit to Stand: Mod assist;From elevated surface         General transfer comment: pt required Mod A to power up to full stand from elevated EOB. VC's for hand placement on seated surface for safety.   Ambulation/Gait Ambulation/Gait assistance: Min guard Gait Distance (Feet): 150 Feet Assistive device: Rolling walker (2 wheeled) Gait Pattern/deviations: Step-through pattern;Decreased  stride length;Trunk flexed Gait velocity: decreased Gait velocity interpretation: <1.8 ft/sec, indicate of risk for recurrent falls General Gait Details: Slow but generally steady with RW. Overall appears to be improved from prior session.    Stairs             Wheelchair Mobility    Modified Rankin (Stroke Patients Only)       Balance Overall balance assessment: Needs assistance;History of Falls Sitting-balance support: No upper extremity supported;Feet supported Sitting balance-Leahy Scale: Good     Standing balance support: Bilateral upper extremity supported Standing balance-Leahy Scale: Poor Standing balance comment: Reliant on UE support                            Cognition Arousal/Alertness: Awake/alert Behavior During Therapy: WFL for tasks assessed/performed Overall Cognitive Status: Within Functional Limits for tasks assessed                                        Exercises      General Comments        Pertinent Vitals/Pain Pain Assessment: Faces Faces Pain Scale: Hurts a little bit Pain Location: neck Pain Descriptors / Indicators: Aching;Burning;Operative site guarding;Discomfort Pain Intervention(s): Limited activity within patient's tolerance;Monitored during session;Repositioned    Home Living                      Prior Function            PT Goals (current goals can now be found in the care plan section) Acute Rehab PT Goals Patient Stated  Goal: return home PT Goal Formulation: With patient Time For Goal Achievement: 04/05/19 Potential to Achieve Goals: Good Progress towards PT goals: Progressing toward goals    Frequency    Min 5X/week      PT Plan Current plan remains appropriate    Co-evaluation              AM-PAC PT "6 Clicks" Mobility   Outcome Measure  Help needed turning from your back to your side while in a flat bed without using bedrails?: A Little Help needed moving  from lying on your back to sitting on the side of a flat bed without using bedrails?: A Little Help needed moving to and from a bed to a chair (including a wheelchair)?: A Little Help needed standing up from a chair using your arms (e.g., wheelchair or bedside chair)?: A Little Help needed to walk in hospital room?: A Little Help needed climbing 3-5 steps with a railing? : A Lot 6 Click Score: 17    End of Session Equipment Utilized During Treatment: Gait belt Activity Tolerance: Patient tolerated treatment well Patient left: in bed;with call bell/phone within reach Nurse Communication: Mobility status PT Visit Diagnosis: Unsteadiness on feet (R26.81);Pain;Difficulty in walking, not elsewhere classified (R26.2);Muscle weakness (generalized) (M62.81) Pain - part of body: (neck)     Time: [id number] PT Time Calculation (min) (ACUTE ONLY): 15 min  Charges:  $Gait Training: 8-22 mins                     Rolinda Roan, PT, DPT Acute Rehabilitation Services Pager: [phone] Office: [phone]    Thelma Comp 03/31/2019, 1:55 PM

## 2019-03-31 NOTE — Plan of Care (Signed)
Pt given D/C instructions with verbal understanding. Rx's were sent to pharmacy by MD. Pt's incision is clean and dry with no sign of infection. Pt's IV was removed prior to D/C. Pt's home health was arranged by [facility] team prior to D/C. Pt D/C'd home via wheelchair per MD order. Pt is stable @ D/C and has no other needs at this time. Holli Humbles, RN

## 2019-03-31 NOTE — TOC Transition Note (Signed)
Transition of Care [facility]) - CM/SW Discharge Note   Patient Details  Name: Nakylah Gritter MRN: [id number] Date of Birth: [birth year]  Transition of Care [facility]) CM/SW Contact:  Sharin Mons, RN Phone Number: 03/31/2019, 10:55 AM   Clinical Narrative:   - s/p C5-7 posterior cervical laminectomy, 03/29/2019 Pt will transition to home with husband. States has no DME needs. Husband to provide transportation to home. HH orders noted. Pt agreeable to home health services. Choice provided and pt selected [facility] [place] agency. NCM made referral , acceptance pending. NCM  to f/iu with patient [phone]).   Final next level of care: [place] Barriers to Discharge: No Barriers Identified   Patient Goals and CMS Choice     Choice offered to / list presented to : Patient  Discharge Placement   Discharge Plan and Services     HH Arranged: PT, OT [facility] Agency: [place] Date [place]: 03/31/19 Time [place]: [id number] Representative spoke with at [place]: [place] ([place]) Interventions     Readmission Risk Interventions No flowsheet data found.

## 2019-03-31 NOTE — Progress Notes (Signed)
Occupational Therapy Treatment Patient Details Name: Natasha Lawson MRN: [id number] DOB: 1941-03-03 Today's Date: 03/31/2019    History of present illness Pt is a 78 yo female s/p C5-7 posterior cervical laminectomy and instrumented fusion. PMHx: previous neck sx.   OT comments  Pt making steady progress towards OT goals this session. Session focus on functional mobility, education on cervical precautions in relation to ADLs and toilet transfer/ hygiene. Overall, pt requires min guard for functional mobility with RW, however needing MOD A to sit>stand from EOB. Pt completes toilet transfer with min guard assist needing close min guard during pericare as pt noted to lean backwards needing task. Education provided on compensatory strategies to assist with maintaining cervical precautions during toileting hygiene. Overall, pt limited by decreased activity tolerance and pain in R shoulder this session, but still making progress towards OT goals. Pt hopeful to DC home today with husband and HHOT, will continue to follow acutely per POC.    Follow Up Recommendations  Home health OT;Supervision - Intermittent    Equipment Recommendations  None recommended by OT    Recommendations for Other Services      Precautions / Restrictions Precautions Precautions: Cervical Precaution Booklet Issued: Yes (comment) Precaution Comments: reviewed cervical precautions Required Braces or Orthoses: (no brace needed per order) Restrictions Weight Bearing Restrictions: No       Mobility Bed Mobility Overal bed mobility: Needs Assistance Bed Mobility: Sidelying to Sit;Sit to Sidelying;Rolling Rolling: Min guard Sidelying to sit: Min guard;HOB elevated     Sit to sidelying: Mod assist;HOB elevated General bed mobility comments: pt min guard for sidelying to sit with heavy use of bed rails and elevated HOB.Pt reports no rails at home but can use nightstand in place of rail. Pt required MOD A to return BLEs  back to bed, education provided on using leg lifter at home  Transfers Overall transfer level: Needs assistance Equipment used: Rolling walker (2 wheeled) Transfers: Sit to/from Stand Sit to Stand: Min guard;Mod assist;From elevated surface         General transfer comment: pt required MOD A to power up from EOB from slighlty elevated surface. Pt required min guard sit>stand from toilet, increased time and effort noted    Balance Overall balance assessment: Needs assistance;History of Falls Sitting-balance support: No upper extremity supported;Feet supported Sitting balance-Leahy Scale: Good     Standing balance support: Bilateral upper extremity supported Standing balance-Leahy Scale: Poor Standing balance comment: reliant on RW, noted posterior lean in standing during pericare needing close min guard for balance                           ADL either performed or assessed with clinical judgement   ADL Overall ADL's : Needs assistance/impaired                         Toilet Transfer: Environmental consultant;Ambulation;Cueing for safety Toilet Transfer Details (indicate cue type and reason): increased time to power up from toilet, cues for hand placement/ safety to maintain back precautions Toileting- Clothing Manipulation and Hygiene: Supervision/safety;Sitting/lateral lean;Sit to/from stand;Cueing for safety;Adhering to back precautions Toileting - Clothing Manipulation Details (indicate cue type and reason): cues to utilize lateral leans to maintain back precautions for posterior pericare, education provided on AE to assist with pericare if  needed     Functional mobility during ADLs: Min guard;Rolling walker;Cueing for safety General ADL Comments: pt limited by pain  in R shoulder and decreased activity tolerance. Session focus on functional mobility and toilet transfer/ hygiene. Pt reports using hip kit at home. education provided on using leg lifter to  transition BLEs back to bed as pt reports needing increased assist with task. Pt dressed already for DC declining practice with LB AE.     Vision Baseline Vision/History: No visual deficits Patient Visual Report: No change from baseline Vision Assessment?: No apparent visual deficits   Perception     Praxis      Cognition Arousal/Alertness: Awake/alert Behavior During Therapy: WFL for tasks assessed/performed Overall Cognitive Status: Within Functional Limits for tasks assessed                                          Exercises     Shoulder Instructions       General Comments      Pertinent Vitals/ Pain       Pain Assessment: Faces Faces Pain Scale: Hurts a little bit Pain Location: neck Pain Descriptors / Indicators: Aching;Burning;Operative site guarding;Discomfort Pain Intervention(s): Limited activity within patient's tolerance;Monitored during session;Repositioned  Home Living                                          Prior Functioning/Environment              Frequency  Min 2X/week        Progress Toward Goals  OT Goals(current goals can now be found in the care plan section)  Progress towards OT goals: Progressing toward goals  Acute Rehab OT Goals Patient Stated Goal: return home OT Goal Formulation: With patient Time For Goal Achievement: 04/13/19 Potential to Achieve Goals: Good  Plan Discharge plan remains appropriate    Co-evaluation                 AM-PAC OT "6 Clicks" Daily Activity     Outcome Measure     Help from another person taking care of personal grooming?: A Little Help from another person toileting, which includes using toliet, bedpan, or urinal?: A Little Help from another person bathing (including washing, rinsing, drying)?: A Lot Help from another person to put on and taking off regular upper body clothing?: A Little Help from another person to put on and taking off regular  lower body clothing?: A Lot 6 Click Score: 13    End of Session Equipment Utilized During Treatment: Gait belt;Rolling walker;Cervical collar  OT Visit Diagnosis: Muscle weakness (generalized) (M62.81);Pain   Activity Tolerance Patient tolerated treatment well   Patient Left in bed;with call bell/phone within reach   Nurse Communication Mobility status;Other (comment)(asking for coke)        Time: 2751-7001 OT Time Calculation (min): 20 min  Charges: OT General Charges $OT Visit: 1 Visit OT Treatments $Self Care/Home Management : 8-22 mins  Natasha Clam., Natasha Lawson Acute Rehabilitation Services [phone]    Natasha Lawson 03/31/2019, 9:13 AM

## 2019-03-31 NOTE — Progress Notes (Signed)
Neurosurgery Service Progress Note  Subjective: No acute events overnight, dysesthetic lower extremity pain / balance is better, no new complaints, neck pain improving  Objective: Vitals:   03/30/19 1947 03/30/19 2318 03/31/19 0421 03/31/19 0731  BP: (!) 120/55 (!) 101/42 (!) 113/51 (!) 110/41  Pulse: 77 73 68 66  Resp: 18 18 20 18   Temp: 98.6 F (37 C) 98.5 F (36.9 C) 98.2 F (36.8 C) 98.7 F (37.1 C)  TempSrc: Oral Oral Oral Oral  SpO2: 92% 95% 94% 93%  Weight:      Height:       Temp (24hrs), Avg:98.7 F (37.1 C), Min:98.2 F (36.8 C), Max:99.2 F (37.3 C)  CBC Latest Ref Rng & Units 03/25/2019  WBC 4.0 - 10.5 K/uL 5.6  Hemoglobin 12.0 - 15.0 g/dL 11.3(L)  Hematocrit 36.0 - 46.0 % 35.1(L)  Platelets 150 - 400 K/uL 146(L)   BMP Latest Ref Rng & Units 03/25/2019  Glucose 70 - 99 mg/dL 111(H)  BUN 8 - 23 mg/dL 24(H)  Creatinine 0.44 - 1.00 mg/dL 0.97  Sodium 135 - 145 mmol/L 143  Potassium 3.5 - 5.1 mmol/L 3.2(L)  Chloride 98 - 111 mmol/L 106  CO2 22 - 32 mmol/L 29  Calcium 8.9 - 10.3 mg/dL 8.9   No intake or output data in the 24 hours ending 03/31/19 0827  Current Facility-Administered Medications:  .  acetaminophen (TYLENOL) tablet 650 mg, 650 mg, Oral, Q4H PRN, 650 mg at 03/31/19 0537 **OR** acetaminophen (TYLENOL) suppository 650 mg, 650 mg, Rectal, Q4H PRN, Judith Part, MD .  allopurinol (ZYLOPRIM) tablet 300 mg, 300 mg, Oral, Daily, Australia Droll A, MD, 300 mg at 03/30/19 1001 .  cyclobenzaprine (FLEXERIL) tablet 10 mg, 10 mg, Oral, TID PRN, Judith Part, MD, 10 mg at 03/30/19 1718 .  docusate sodium (COLACE) capsule 100 mg, 100 mg, Oral, BID, Judith Part, MD, 100 mg at 03/30/19 2133 .  HYDROmorphone (DILAUDID) injection 1 mg, 1 mg, Intravenous, Q3H PRN, Judith Part, MD, 1 mg at 03/29/19 1250 .  menthol-cetylpyridinium (CEPACOL) lozenge 3 mg, 1 lozenge, Oral, PRN **OR** phenol (CHLORASEPTIC) mouth spray 1 spray, 1 spray,  Mouth/Throat, PRN, Lyniah Fujita A, MD .  nebivolol (BYSTOLIC) tablet 10 mg, 10 mg, Oral, Daily, Kemiah Booz A, MD, 10 mg at 03/30/19 1000 .  ondansetron (ZOFRAN) tablet 4 mg, 4 mg, Oral, Q6H PRN **OR** ondansetron (ZOFRAN) injection 4 mg, 4 mg, Intravenous, Q6H PRN, Princes Finger A, MD .  oxyCODONE (Oxy IR/ROXICODONE) immediate release tablet 10 mg, 10 mg, Oral, Q4H PRN, Judith Part, MD, 10 mg at 03/30/19 2133 .  oxyCODONE (Oxy IR/ROXICODONE) immediate release tablet 5 mg, 5 mg, Oral, Q4H PRN, Judith Part, MD, 5 mg at 03/31/19 0537 .  pantoprazole (PROTONIX) EC tablet 40 mg, 40 mg, Oral, Daily, Haruye Lainez A, MD, 40 mg at 03/30/19 1000 .  polyethylene glycol (MIRALAX / GLYCOLAX) packet 17 g, 17 g, Oral, Daily PRN, Judith Part, MD, 17 g at 03/30/19 2133 .  potassium chloride SA (KLOR-CON) CR tablet 10 mEq, 10 mEq, Oral, BID, Judith Part, MD, 10 mEq at 03/30/19 2133 .  sertraline (ZOLOFT) tablet 100 mg, 100 mg, Oral, Daily, Dquan Cortopassi A, MD, 100 mg at 03/30/19 1000 .  sodium chloride 0.9 % bolus 1,000 mL, 1,000 mL, Intravenous, Once, Melina Schools, MD .  torsemide [facility]) tablet 20 mg, 20 mg, Oral, Daily, Rebekah Sprinkle, Joyice Faster, MD, 20 mg at 03/30/19 1001 .  [  START ON 04/04/2019] Vitamin D (Ergocalciferol) (DRISDOL) capsule 50,000 Units, 50,000 Units, Oral, Weekly, Trenese Haft, Joyice Faster, MD   Physical Exam: AOx3, PERRL, EOMI, FS, Strength diffusely 4/5 in BUE, +b/l hoffman's, diffuse mild numbness Incision c/d/i  Assessment & Plan: 78 y.o. 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  -dysesthetic pain resolution is uncommon, very happy that she received this much benefit this early -continue current pain regimen, PT/OT, PT rec'd home PT -discharge home today  Judith Part   03/31/19 8:27 AM

## 2019-04-01 ENCOUNTER — Inpatient Hospital Stay (HOSPITAL_COMMUNITY)
Admission: EM | Admit: 2019-04-01 | Discharge: 2019-04-04 | DRG: 091 | Disposition: A | Discharge status: Rehabilitation Facility | Payer: Medicare Other | Attending: Neurological Surgery | Admitting: Neurological Surgery

## 2019-04-01 ENCOUNTER — Inpatient Hospital Stay (HOSPITAL_COMMUNITY): Payer: Medicare Other

## 2019-04-01 ENCOUNTER — Encounter (HOSPITAL_COMMUNITY): Payer: Self-pay | Admitting: Emergency Medicine

## 2019-04-01 DIAGNOSIS — G8250 Quadriplegia, unspecified: Secondary | ICD-10-CM | POA: Diagnosis present

## 2019-04-01 DIAGNOSIS — Z8659 Personal history of other mental and behavioral disorders: Secondary | ICD-10-CM | POA: Diagnosis not present

## 2019-04-01 DIAGNOSIS — D509 Iron deficiency anemia, unspecified: Secondary | ICD-10-CM | POA: Diagnosis present

## 2019-04-01 DIAGNOSIS — D696 Thrombocytopenia, unspecified: Secondary | ICD-10-CM | POA: Diagnosis present

## 2019-04-01 DIAGNOSIS — Z7901 Long term (current) use of anticoagulants: Secondary | ICD-10-CM | POA: Diagnosis not present

## 2019-04-01 DIAGNOSIS — Z8249 Family history of ischemic heart disease and other diseases of the circulatory system: Secondary | ICD-10-CM

## 2019-04-01 DIAGNOSIS — E1165 Type 2 diabetes mellitus with hyperglycemia: Secondary | ICD-10-CM | POA: Diagnosis present

## 2019-04-01 DIAGNOSIS — H9191 Unspecified hearing loss, right ear: Secondary | ICD-10-CM | POA: Diagnosis present

## 2019-04-01 DIAGNOSIS — G959 Disease of spinal cord, unspecified: Principal | ICD-10-CM | POA: Diagnosis present

## 2019-04-01 DIAGNOSIS — E876 Hypokalemia: Secondary | ICD-10-CM | POA: Diagnosis present

## 2019-04-01 DIAGNOSIS — Z20828 Contact with and (suspected) exposure to other viral communicable diseases: Secondary | ICD-10-CM | POA: Diagnosis present

## 2019-04-01 DIAGNOSIS — M25519 Pain in unspecified shoulder: Secondary | ICD-10-CM

## 2019-04-01 DIAGNOSIS — Z809 Family history of malignant neoplasm, unspecified: Secondary | ICD-10-CM | POA: Diagnosis not present

## 2019-04-01 DIAGNOSIS — R531 Weakness: Secondary | ICD-10-CM | POA: Diagnosis present

## 2019-04-01 DIAGNOSIS — Z853 Personal history of malignant neoplasm of breast: Secondary | ICD-10-CM

## 2019-04-01 DIAGNOSIS — G8918 Other acute postprocedural pain: Secondary | ICD-10-CM | POA: Diagnosis not present

## 2019-04-01 DIAGNOSIS — R600 Localized edema: Secondary | ICD-10-CM | POA: Diagnosis present

## 2019-04-01 DIAGNOSIS — Z9049 Acquired absence of other specified parts of digestive tract: Secondary | ICD-10-CM | POA: Diagnosis not present

## 2019-04-01 DIAGNOSIS — Z79899 Other long term (current) drug therapy: Secondary | ICD-10-CM

## 2019-04-01 DIAGNOSIS — Z4789 Encounter for other orthopedic aftercare: Secondary | ICD-10-CM | POA: Diagnosis present

## 2019-04-01 DIAGNOSIS — F419 Anxiety disorder, unspecified: Secondary | ICD-10-CM | POA: Diagnosis present

## 2019-04-01 DIAGNOSIS — Z91013 Allergy to seafood: Secondary | ICD-10-CM | POA: Diagnosis not present

## 2019-04-01 DIAGNOSIS — R296 Repeated falls: Secondary | ICD-10-CM | POA: Diagnosis present

## 2019-04-01 DIAGNOSIS — I480 Paroxysmal atrial fibrillation: Secondary | ICD-10-CM | POA: Diagnosis present

## 2019-04-01 DIAGNOSIS — M4802 Spinal stenosis, cervical region: Secondary | ICD-10-CM | POA: Diagnosis present

## 2019-04-01 DIAGNOSIS — Z888 Allergy status to other drugs, medicaments and biological substances status: Secondary | ICD-10-CM | POA: Diagnosis not present

## 2019-04-01 DIAGNOSIS — Z90710 Acquired absence of both cervix and uterus: Secondary | ICD-10-CM | POA: Diagnosis not present

## 2019-04-01 DIAGNOSIS — Z91041 Radiographic dye allergy status: Secondary | ICD-10-CM | POA: Diagnosis not present

## 2019-04-01 DIAGNOSIS — H9190 Unspecified hearing loss, unspecified ear: Secondary | ICD-10-CM | POA: Diagnosis present

## 2019-04-01 DIAGNOSIS — I10 Essential (primary) hypertension: Secondary | ICD-10-CM | POA: Diagnosis present

## 2019-04-01 DIAGNOSIS — Z981 Arthrodesis status: Secondary | ICD-10-CM | POA: Diagnosis not present

## 2019-04-01 DIAGNOSIS — R791 Abnormal coagulation profile: Secondary | ICD-10-CM | POA: Diagnosis not present

## 2019-04-01 DIAGNOSIS — M109 Gout, unspecified: Secondary | ICD-10-CM | POA: Diagnosis present

## 2019-04-01 DIAGNOSIS — K219 Gastro-esophageal reflux disease without esophagitis: Secondary | ICD-10-CM | POA: Diagnosis present

## 2019-04-01 DIAGNOSIS — R739 Hyperglycemia, unspecified: Secondary | ICD-10-CM | POA: Diagnosis not present

## 2019-04-01 DIAGNOSIS — G4733 Obstructive sleep apnea (adult) (pediatric): Secondary | ICD-10-CM | POA: Diagnosis present

## 2019-04-01 DIAGNOSIS — Z8505 Personal history of malignant neoplasm of liver: Secondary | ICD-10-CM | POA: Diagnosis not present

## 2019-04-01 DIAGNOSIS — M4712 Other spondylosis with myelopathy, cervical region: Secondary | ICD-10-CM | POA: Diagnosis present

## 2019-04-01 DIAGNOSIS — G9763 Postprocedural seroma of a nervous system organ or structure following a nervous system procedure: Secondary | ICD-10-CM | POA: Diagnosis not present

## 2019-04-01 DIAGNOSIS — I4891 Unspecified atrial fibrillation: Secondary | ICD-10-CM | POA: Diagnosis not present

## 2019-04-01 LAB — CBC
HCT: 29.1 % — ABNORMAL LOW (ref 36.0–46.0)
Hemoglobin: 9.6 g/dL — ABNORMAL LOW (ref 12.0–15.0)
MCH: 33.6 pg (ref 26.0–34.0)
MCHC: 33 g/dL (ref 30.0–36.0)
MCV: 101.7 fL — ABNORMAL HIGH (ref 80.0–100.0)
Platelets: 125 10*3/uL — ABNORMAL LOW (ref 150–400)
RBC: 2.86 MIL/uL — ABNORMAL LOW (ref 3.87–5.11)
RDW: 13.9 % (ref 11.5–15.5)
WBC: 7.9 10*3/uL (ref 4.0–10.5)
nRBC: 0 % (ref 0.0–0.2)

## 2019-04-01 LAB — BASIC METABOLIC PANEL
Anion gap: 10 (ref 5–15)
BUN: 16 mg/dL (ref 8–23)
CO2: 26 mmol/L (ref 22–32)
Calcium: 8.5 mg/dL — ABNORMAL LOW (ref 8.9–10.3)
Chloride: 107 mmol/L (ref 98–111)
Creatinine, Ser: 0.83 mg/dL (ref 0.44–1.00)
GFR calc Af Amer: >60 mL/min (ref >60)
GFR calc non Af Amer: >60 mL/min (ref >60)
Glucose, Bld: 103 mg/dL — ABNORMAL HIGH (ref 70–99)
Potassium: 2.9 mmol/L — ABNORMAL LOW (ref 3.5–5.1)
Sodium: 143 mmol/L (ref 135–145)

## 2019-04-01 IMAGING — MR MR CERVICAL SPINE W/O CM
5 series · 34 of 48 positions shown · non-contrast
Comparison: Prior MRI from [DATE].

CLINICAL DATA: Initial evaluation for spinal stenosis, recent
posterior decompression.

EXAM:
MRI CERVICAL SPINE WITHOUT CONTRAST
TECHNIQUE: Multiplanar, multisequence MR imaging of the cervical spine was
performed. No intravenous contrast was administered.

[Series 5: T2 · sagittal · 3.0mm · 0.69mm/px · 6 of 15 slices shown (1 of 2)]
[im 1/15]
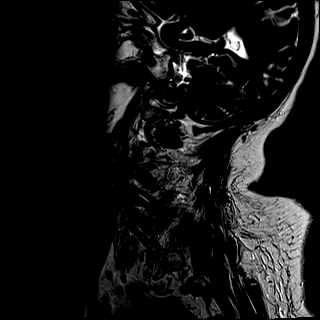
[im 3/15]
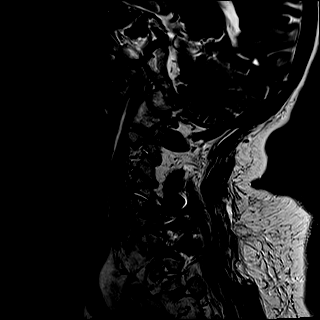
[im 6/15]
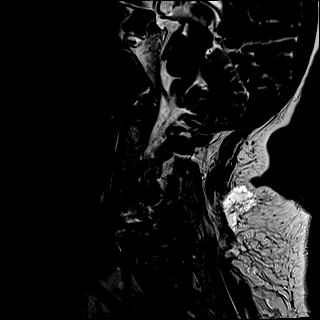
[im 9/15]
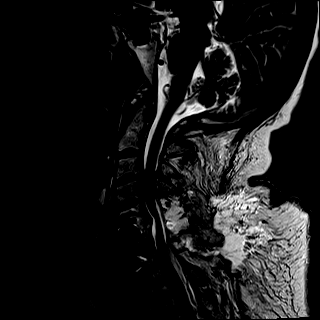
[im 12/15]
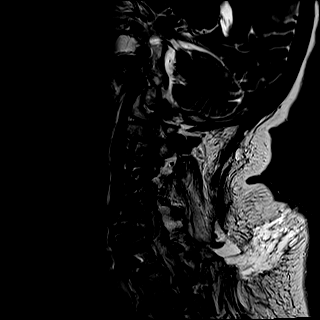
[im 15/15]
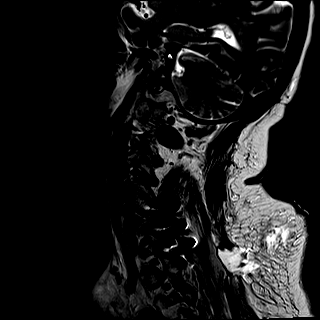

[Series 6: STIR · sagittal · 3.0mm · 0.86mm/px · 7 of 15 slices shown]
[im 1/15]
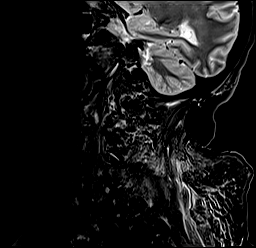
[im 3/15]
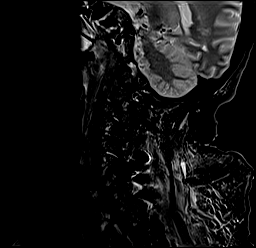
[im 5/15]
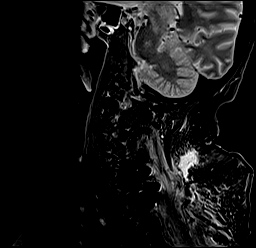
[im 8/15]
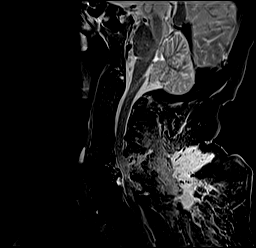
[im 10/15]
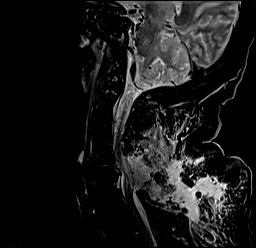
[im 12/15]
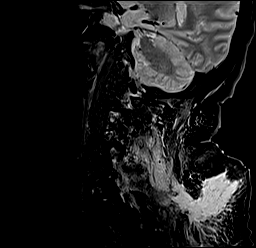
[im 15/15]
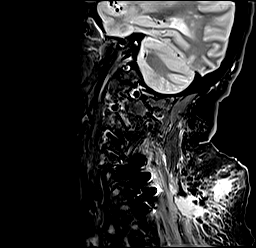

[Series 7: T1 · sagittal · 3.0mm · 0.69mm/px · 7 of 15 slices shown]
[im 1/15]
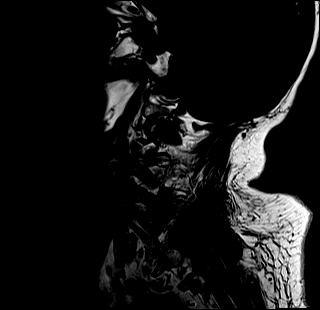
[im 3/15]
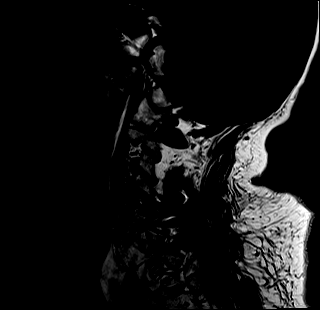
[im 5/15]
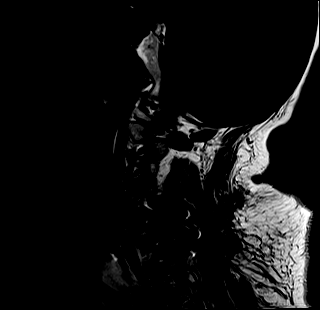
[im 8/15]
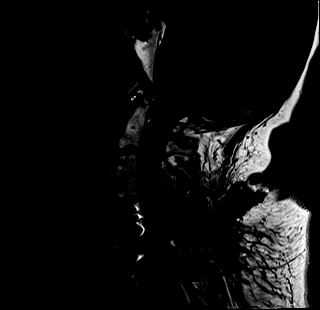
[im 10/15]
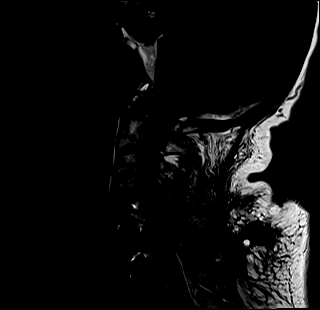
[im 12/15]
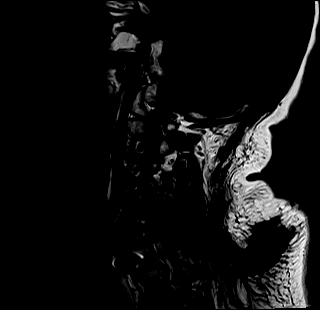
[im 15/15]
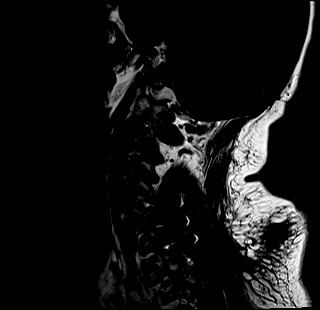

[Series 8: T2 · axial · 3.0mm · 0.66mm/px · z∈[-125,-38]mm · 8 of 32 slices shown (2 of 2)]
[im 1/32]
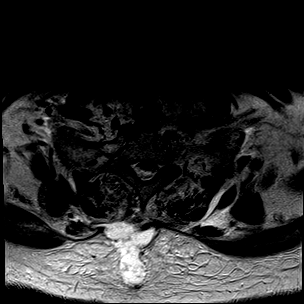
[im 5/32]
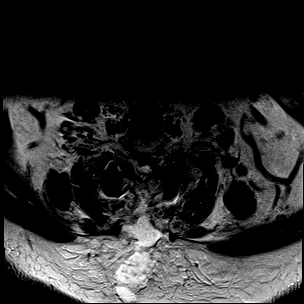
[im 10/32]
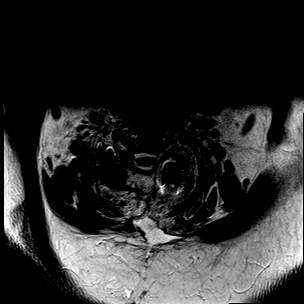
[im 15/32]
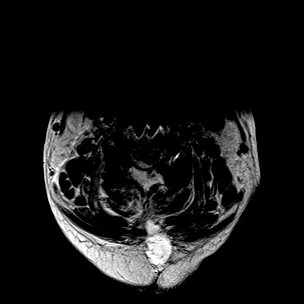
[im 17/32]
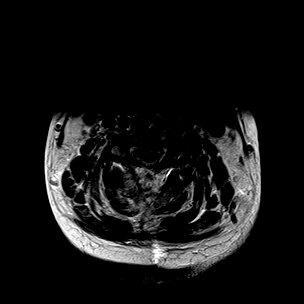
[im 22/32]
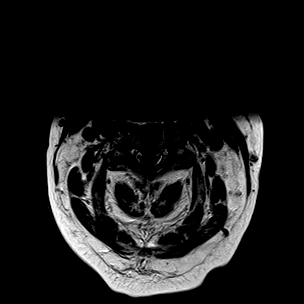
[im 27/32]
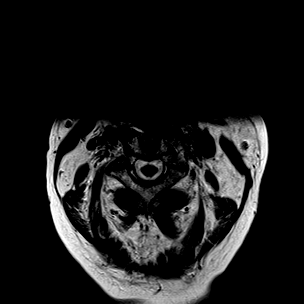
[im 32/32]
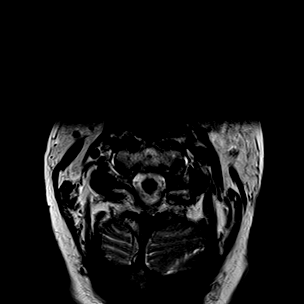

[Series 9: GRE · axial · 3.0mm · 0.39mm/px · z∈[-120,-61]mm · 6 of 32 slices shown]
[im 1/32]
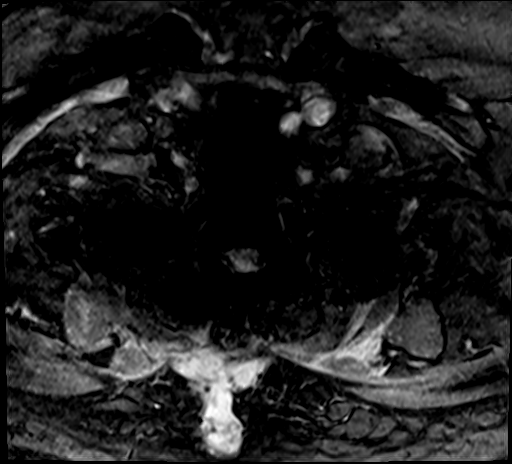
[im 5/32]
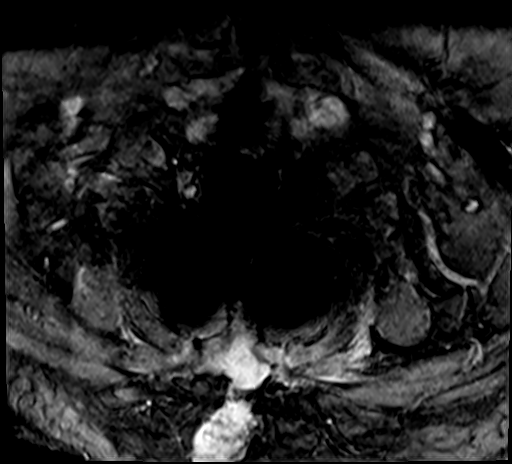
[im 10/32]
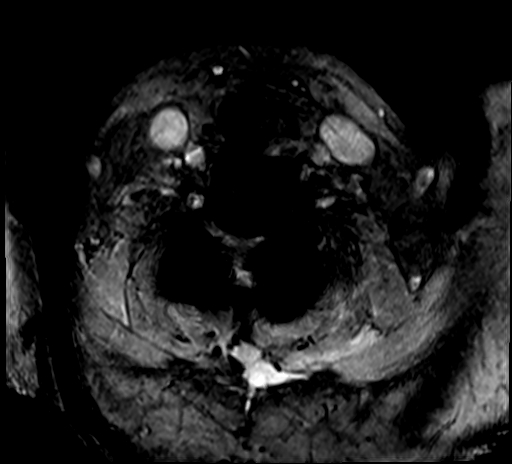
[im 15/32]
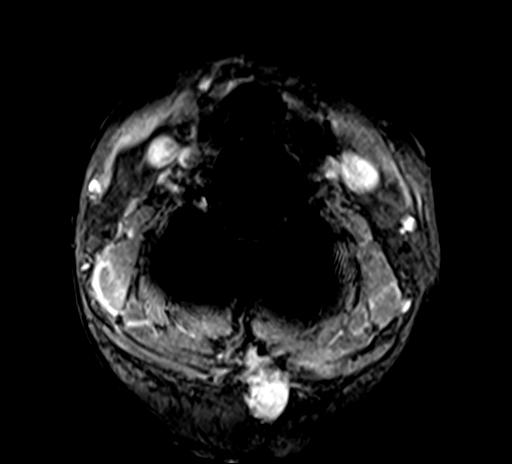
[im 17/32]
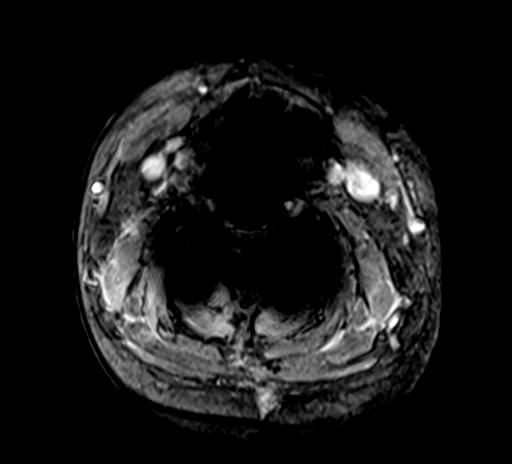
[im 22/32]
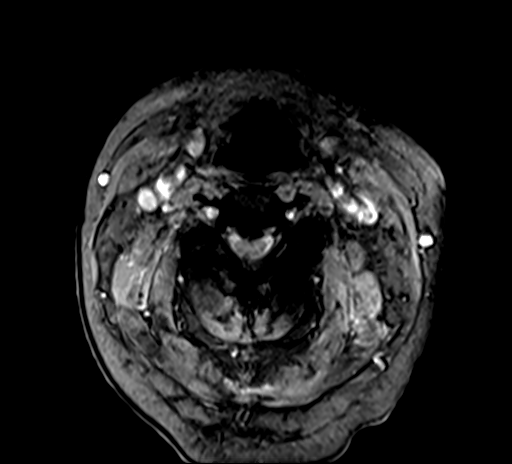

[34 of 48 positions shown; findings below may reference images not displayed]

FINDINGS: Alignment: Mild straightening of the normal cervical lordosis with
trace retrolisthesis of C3 on C4, stable.

Vertebrae: Postoperative changes from prior C6 corpectomy with
anterior fusion at C5-C7. There has been interval performance of
posterior decompression with fusion at C5-C7. No evidence for acute
or interval fracture. Bone marrow signal intensity within normal
limits. No discrete osseous lesions. Reactive marrow edema seen
about the right C2-3 facet due to facet arthritis.

Cord: Persistent patchy T2/stir signal abnormality within the
cervical spinal cord at the level of C6, consistent with myelopathy.
Changes more pronounced within the left aspect of the cervical
spinal cord. Finding fairly similar to preoperative exam (series 8,
image 22). There is mass effect with flattening of the dorsal aspect
of the cervical spinal cord by a an intradural or subdural
collection extending from approximately C5-C7 (series 8, image 19).
Collection measures 0.9 x 0.5 x 2.4 cm in greatest dimensions (AP by
transverse by craniocaudad). Collection demonstrates intrinsic T2
signal intensity, with no intrinsic T1 hyperintensity to suggest
blood. Possible small focal CSF leak not excluded. Distal aspect of
the collection essentially resolves by the level of C7, but does
reaccumulate and continue inferiorly into the upper thoracic spine
(series 9, image 20). No progressive cord signal changes to suggest
edema or myelopathy as compared to preoperative exam. No frank
epidural collection or hematoma.

Posterior Fossa, vertebral arteries, paraspinal tissues: Visualized
brain and posterior fossa within normal limits. Craniocervical
junction normal. Postoperative changes from recent decompressive
laminectomy with fusion at C5-C7. Postoperative collection along the
midline incision and at the laminectomy site favored to reflect
postoperative seroma. This measures approximately 5.5 x 3.8 x 5.4 cm
in size (AP by transverse by craniocaudad). Component at the
laminectomy site itself measures 1.5 x 2.3 x 2.8 cm (AP by
transverse by craniocaudad). No significant mass effect on the
adjacent thecal sac.

Remainder of the paraspinous soft tissues within normal limits.
Normal intravascular flow voids seen within the vertebral arteries
bilaterally.

Disc levels:

C2-C3: Right-sided facet hypertrophy. No significant disc bulge. No
spinal stenosis. Mild right C3 foraminal narrowing.

C3-C4: Central disc osteophyte complex indents the ventral thecal
sac, contacting the ventral spinal cord. Mild spinal stenosis
without significant cord flattening. Foramina remain patent.

C4-C5: Disc bulge with bilateral uncovertebral hypertrophy.
Bilateral facet and ligament flavum hypertrophy. Mild spinal
stenosis. Moderate left with mild right C5 foraminal stenosis.

C5-C6: Prior fusion with C6 corpectomy, with interval posterior
decompression. Small collection at the laminectomy site as above.
Subdural and/or intradural collection at the right posterior aspect
of the spinal cord with secondary flattening of the right hemi cord
(series 8, image 19). No cord signal changes. A degree of underlying
cord atrophy may be present. No significant spinal stenosis.
Residual uncovertebral hypertrophy with resultant severe bilateral
C6 foraminal stenosis.

C6-C7: Prior fusion with C6 corpectomy. Interval posterior
decompression with small collection at the laminectomy site. Thecal
sac remains patent. Residual uncovertebral spurring with moderate to
severe left greater than right C7 foraminal stenosis.

C7-T1: Negative interspace. Bilateral facet hypertrophy. Intradural
and/or subdural collection at the dorsal aspect of the spinal cord
(series 9, image 29). Secondary flattening of the dorsal aspect of
the cord which is slightly compressed anteriorly. No cord signal
changes. Foramina remain patent.
IMPRESSION: 1. Sequelae of prior C6 corpectomy with anterior fusion at C5-C7,
with postoperative changes from recent posterior decompression with
fusion at C5-C7. Small subdural and/or intradural collection
extending from C5 through C7 with secondary mass effect on the
dorsal and right aspect of the cord as above. Moderate compression
of the cervical spinal cord without new or progressive cord signal
changes. Collection continues inferiorly at the dorsal aspect of the
thoracic spinal cord extending from C7 through the visualized upper
thoracic spinal cord, mildly flattening and compressing the spinal
cord anteriorly. Again, no cord signal changes. Collection is
predominantly simple in appearance, with no intradural or epidural
hematoma identified. Possible CSF leak not excluded.
2. Postoperative changes from posterior decompression with fusion
with associated collections along the midline incision and at the
laminectomy site as above. Benign postoperative seromas are favored.
3. Underlying multilevel cervical spondylosis as detailed above.
Otherwise no other new or progressive findings as compared to recent
MRI from [DATE].

## 2019-04-01 MED ORDER — POTASSIUM CHLORIDE CRYS ER 10 MEQ PO TBCR
10.0000 meq | EXTENDED_RELEASE_TABLET | Freq: Two times a day (BID) | ORAL | Status: DC
  Administered 2019-04-01 – 2019-04-04 (×6): 10 meq via ORAL
  Filled 2019-04-01 (×6): qty 1

## 2019-04-01 MED ORDER — ACETAMINOPHEN 325 MG PO TABS
650.0000 mg | ORAL_TABLET | Freq: Four times a day (QID) | ORAL | Status: DC | PRN
  Administered 2019-04-01 – 2019-04-02 (×2): 650 mg via ORAL
  Filled 2019-04-01 (×2): qty 2

## 2019-04-01 MED ORDER — ALLOPURINOL 100 MG PO TABS
300.0000 mg | ORAL_TABLET | Freq: Every day | ORAL | Status: DC
  Administered 2019-04-02 – 2019-04-04 (×3): 300 mg via ORAL
  Filled 2019-04-01 (×3): qty 3

## 2019-04-01 MED ORDER — SENNOSIDES-DOCUSATE SODIUM 8.6-50 MG PO TABS
1.0000 | ORAL_TABLET | Freq: Every evening | ORAL | Status: DC | PRN
  Administered 2019-04-03 – 2019-04-04 (×2): 1 via ORAL
  Filled 2019-04-01 (×2): qty 1

## 2019-04-01 MED ORDER — ONDANSETRON HCL 4 MG PO TABS: 4.0000 mg | ORAL_TABLET | Freq: Four times a day (QID) | ORAL | Status: DC | PRN

## 2019-04-01 MED ORDER — SODIUM CHLORIDE 0.9 % IV SOLN: INTRAVENOUS | Status: DC

## 2019-04-01 MED ORDER — PANTOPRAZOLE SODIUM 40 MG PO TBEC
40.0000 mg | DELAYED_RELEASE_TABLET | Freq: Every day | ORAL | Status: DC
  Administered 2019-04-02 – 2019-04-04 (×3): 40 mg via ORAL
  Filled 2019-04-01 (×3): qty 1

## 2019-04-01 MED ORDER — OXYCODONE HCL 5 MG PO TABS
5.0000 mg | ORAL_TABLET | ORAL | Status: DC | PRN
  Administered 2019-04-02: 5 mg via ORAL
  Filled 2019-04-01: qty 1

## 2019-04-01 MED ORDER — SERTRALINE HCL 100 MG PO TABS
100.0000 mg | ORAL_TABLET | Freq: Every day | ORAL | Status: DC
  Administered 2019-04-02 – 2019-04-04 (×3): 100 mg via ORAL
  Filled 2019-04-01 (×3): qty 1

## 2019-04-01 MED ORDER — ONDANSETRON HCL 4 MG/2ML IJ SOLN: 4.0000 mg | Freq: Four times a day (QID) | INTRAMUSCULAR | Status: DC | PRN

## 2019-04-01 MED ORDER — TORSEMIDE 20 MG PO TABS
20.0000 mg | ORAL_TABLET | Freq: Every day | ORAL | Status: DC
  Administered 2019-04-02 – 2019-04-04 (×3): 20 mg via ORAL
  Filled 2019-04-01 (×3): qty 1

## 2019-04-01 MED ORDER — ACETAMINOPHEN 650 MG RE SUPP: 650.0000 mg | Freq: Four times a day (QID) | RECTAL | Status: DC | PRN

## 2019-04-01 MED ORDER — DEXAMETHASONE SODIUM PHOSPHATE 10 MG/ML IJ SOLN
10.0000 mg | Freq: Four times a day (QID) | INTRAMUSCULAR | Status: DC
  Administered 2019-04-01 – 2019-04-04 (×11): 10 mg via INTRAVENOUS
  Filled 2019-04-01 (×15): qty 1

## 2019-04-01 MED ORDER — CYCLOBENZAPRINE HCL 10 MG PO TABS: 10.0000 mg | ORAL_TABLET | Freq: Three times a day (TID) | ORAL | Status: DC | PRN

## 2019-04-01 MED ORDER — NEBIVOLOL HCL 10 MG PO TABS
10.0000 mg | ORAL_TABLET | Freq: Every day | ORAL | Status: DC
  Administered 2019-04-02 – 2019-04-04 (×3): 10 mg via ORAL
  Filled 2019-04-01 (×3): qty 1

## 2019-04-01 MED ORDER — VITAMIN D (ERGOCALCIFEROL) 1.25 MG (50000 UNIT) PO CAPS
50000.0000 [IU] | ORAL_CAPSULE | ORAL | Status: DC
  Administered 2019-04-02: 09:00:00 50000 [IU] via ORAL
  Filled 2019-04-01: qty 1

## 2019-04-01 NOTE — ED Triage Notes (Signed)
Pt BIB [facility], pt had back surgery x 1 week ago at this facility, pt c/o generalized weakness and difficulty walking. Pt seen at [facility] last night for same and discharged. Pt A&O x 4, EMS VSS.

## 2019-04-01 NOTE — ED Notes (Signed)
ED TO INPATIENT HANDOFF REPORT  ED Nurse Name and Phone #: William Hamburger, [place]  S Name/Age/Gender Natasha Lawson 78 y.o. female Room/Bed: 013C/013C  Code Status   Code Status: Full Code  Home/SNF/Other Home Patient oriented to: self, place, time and situation Is this baseline? No   Triage Complete: Triage complete  Chief Complaint Cervical myelopathy ([place]) [G95.9]  Triage Note Pt BIB [facility], pt had back surgery x 1 week ago at this facility, pt c/o generalized weakness and difficulty walking. Pt seen at [facility] last night for same and discharged. Pt A&O x 4, EMS VSS.    Allergies Allergies  Allergen Reactions  . Capoten [Captopril] Swelling    Tongue   . Iodine Hives  . Norvasc [Amlodipine Besylate] Swelling  . Shellfish Allergy Hives  . Terazosin Hcl Other (See Comments)    Caused a-fib  . Xarelto [Rivaroxaban] Other (See Comments)    Severe back pain  . Welchol [Colesevelam Hcl] Other (See Comments)    Confusion   . Crestor [Rosuvastatin Calcium] Other (See Comments)    Muscle aches  . Fenofibrate Other (See Comments)    Muscle aches  . Pravachol [Pravastatin Sodium] Other (See Comments)    fatigue  . Spironolactone Diarrhea  . Statins Other (See Comments)    Joint pain    Level of Care/Admitting Diagnosis ED Disposition    ED Disposition Condition [facility] Area: [place] [100100]  Level of Care: Med-Surg [16]  Covid Evaluation: Confirmed COVID Negative  Diagnosis: Cervical myelopathy [facility]) [id number]  Admitting Physician: Judith Part [id number]  Attending Physician: Judith Part [phone]  Estimated length of stay: 3 - 4 days  Certification:: I certify this patient will need inpatient services for at least 2 midnights  Bed request comments: 3w       B Medical/Surgery History Past Medical History:  Diagnosis Date  . Anxiety   . Cancer [facility])    breast cancer right  . Dysrhythmia     A-Fib on coumadin  . GERD (gastroesophageal reflux disease)   . Gout   . 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 History of blood transfusion   . Hypertension   . Sleep apnea    uses CPAP nightly   Past Surgical History:  Procedure Laterality Date  . ABDOMINAL HYSTERECTOMY    . BREAST SURGERY Right 08/14/2014   lumpectomy/lynph nodes  . BREAST SURGERY Right 10/05/2016   mass removed  . CARDIAC CATHETERIZATION    . CARPAL TUNNEL RELEASE Bilateral 10/01/11, 12/02/11  . CHOLECYSTECTOMY  1987  . COLONOSCOPY    . DILATION AND CURETTAGE OF UTERUS  10/10/2008   hysteroscopy with biopsy  . EYE SURGERY Bilateral    removed cataracts  . HYSTEROSCOPY WITH D & C  08/19/2011  . INCONTINENCE SURGERY  01/11/2007  . JOINT REPLACEMENT Right 10/18/2013  . laparoscopy bilateral salpingo-oophorectomy  01/27/2012  . NECK SURGERY  11/16/2017  . pci lad  01/02/2015   stent   . POSTERIOR CERVICAL FUSION/FORAMINOTOMY N/A 03/29/2019   Procedure: Cervical Five to Cervical Seven Posterior cervical laminectomy and instrumented fusion;  Surgeon: Judith Part, MD;  Location: [place];  Service: Neurosurgery;  Laterality: N/A;  Cervical Five to Cervical Seven Posterior cervical laminectomy and instrumented fusion  . radiation breast canceer Right    from 12/08/14-01/12/15  . rotator cuff  Right 04/26/2010   repair  . TONSILLECTOMY  1954  .  TUBAL LIGATION  1984  . UPPER GI ENDOSCOPY  2016     A IV Location/Drains/Wounds Patient Lines/Drains/Airways Status   Active Line/Drains/Airways    Name:   Placement date:   Placement time:   Site:   Days:   Peripheral IV 04/01/19 Left Antecubital   04/01/19    2112    Antecubital   less than 1   Incision (Closed) 03/29/19 Neck   03/29/19    0957     3          Intake/Output Last 24 hours No intake or output data in the 24 hours ending 04/01/19 2354  Labs/Imaging Results for orders placed or performed during the hospital encounter of 04/01/19 (from  the past 48 hour(s))  Basic metabolic panel     Status: Abnormal   Collection Time: 04/01/19  9:13 PM  Result Value Ref Range   Sodium 143 135 - 145 mmol/L   Potassium 2.9 (L) 3.5 - 5.1 mmol/L   Chloride 107 98 - 111 mmol/L   CO2 26 22 - 32 mmol/L   Glucose, Bld 103 (H) 70 - 99 mg/dL   BUN 16 8 - 23 mg/dL   Creatinine, Ser 0.83 0.44 - 1.00 mg/dL   Calcium 8.5 (L) 8.9 - 10.3 mg/dL   GFR calc non Af Amer >60 >60 mL/min   GFR calc Af Amer >60 >60 mL/min   Anion gap 10 5 - 15    Comment: Performed at [facility], [place] [address], [place], [place] [address]  CBC     Status: Abnormal   Collection Time: 04/01/19  9:13 PM  Result Value Ref Range   WBC 7.9 4.0 - 10.5 K/uL   RBC 2.86 (L) 3.87 - 5.11 MIL/uL   Hemoglobin 9.6 (L) 12.0 - 15.0 g/dL   HCT 29.1 (L) 36.0 - 46.0 %   MCV 101.7 (H) 80.0 - 100.0 fL   MCH 33.6 26.0 - 34.0 pg   MCHC 33.0 30.0 - 36.0 g/dL   RDW 13.9 11.5 - 15.5 %   Platelets 125 (L) 150 - 400 K/uL   nRBC 0.0 0.0 - 0.2 %    Comment: Performed at [facility] [address] [place], [place] [address]   MR CERVICAL SPINE WO CONTRAST  Result Date: 04/01/2019 CLINICAL DATA:  Initial evaluation for spinal stenosis, recent posterior decompression. EXAM: MRI CERVICAL SPINE WITHOUT CONTRAST TECHNIQUE: Multiplanar, multisequence MR imaging of the cervical spine was performed. No intravenous contrast was administered. COMPARISON:  Prior MRI from 02/14/2019. FINDINGS: Alignment: Mild straightening of the normal cervical lordosis with trace retrolisthesis of C3 on C4, stable. Vertebrae: Postoperative changes from prior C6 corpectomy with anterior fusion at C5-C7. There has been interval performance of posterior decompression with fusion at C5-C7. No evidence for acute or interval fracture. Bone marrow signal intensity within normal limits. No discrete osseous lesions. Reactive marrow edema seen about the right C2-3 facet due to facet arthritis. Cord: Persistent patchy  T2/stir signal abnormality within the cervical spinal cord at the level of C6, consistent with myelopathy. Changes more pronounced within the left aspect of the cervical spinal cord. Finding fairly similar to preoperative exam (series 8, image 22). There is mass effect with flattening of the dorsal aspect of the cervical spinal cord by a an intradural or subdural collection extending from approximately C5-C7 (series 8, image 19). Collection measures 0.9 x 0.5 x 2.4 cm in greatest dimensions (AP by transverse by craniocaudad). Collection demonstrates  intrinsic T2 signal intensity, with no intrinsic T1 hyperintensity to suggest blood. Possible small focal CSF leak not excluded. Distal aspect of the collection essentially resolves by the level of C7, but does reaccumulate and continue inferiorly into the upper thoracic spine (series 9, image 20). No progressive cord signal changes to suggest edema or myelopathy as compared to preoperative exam. No frank epidural collection or hematoma. Posterior Fossa, vertebral arteries, paraspinal tissues: Visualized brain and posterior fossa within normal limits. Craniocervical junction normal. Postoperative changes from recent decompressive laminectomy with fusion at C5-C7. Postoperative collection along the midline incision and at the laminectomy site favored to reflect postoperative seroma. This measures approximately 5.5 x 3.8 x 5.4 cm in size (AP by transverse by craniocaudad). Component at the laminectomy site itself measures 1.5 x 2.3 x 2.8 cm (AP by transverse by craniocaudad). No significant mass effect on the adjacent thecal sac. Remainder of the paraspinous soft tissues within normal limits. Normal intravascular flow voids seen within the vertebral arteries bilaterally. Disc levels: C2-C3: Right-sided facet hypertrophy. No significant disc bulge. No spinal stenosis. Mild right C3 foraminal narrowing. C3-C4: Central disc osteophyte complex indents the ventral thecal sac,  contacting the ventral spinal cord. Mild spinal stenosis without significant cord flattening. Foramina remain patent. C4-C5: Disc bulge with bilateral uncovertebral hypertrophy. Bilateral facet and ligament flavum hypertrophy. Mild spinal stenosis. Moderate left with mild right C5 foraminal stenosis. C5-C6: Prior fusion with C6 corpectomy, with interval posterior decompression. Small collection at the laminectomy site as above. Subdural and/or intradural collection at the right posterior aspect of the spinal cord with secondary flattening of the right hemi cord (series 8, image 19). No cord signal changes. A degree of underlying cord atrophy may be present. No significant spinal stenosis. Residual uncovertebral hypertrophy with resultant severe bilateral C6 foraminal stenosis. C6-C7: Prior fusion with C6 corpectomy. Interval posterior decompression with small collection at the laminectomy site. Thecal sac remains patent. Residual uncovertebral spurring with moderate to severe left greater than right C7 foraminal stenosis. C7-T1: Negative interspace. Bilateral facet hypertrophy. Intradural and/or subdural collection at the dorsal aspect of the spinal cord (series 9, image 29). Secondary flattening of the dorsal aspect of the cord which is slightly compressed anteriorly. No cord signal changes. Foramina remain patent. IMPRESSION: 1. Sequelae of prior C6 corpectomy with anterior fusion at C5-C7, with postoperative changes from recent posterior decompression with fusion at C5-C7. Small subdural and/or intradural collection extending from C5 through C7 with secondary mass effect on the dorsal and right aspect of the cord as above. Moderate compression of the cervical spinal cord without new or progressive cord signal changes. Collection continues inferiorly at the dorsal aspect of the thoracic spinal cord extending from C7 through the visualized upper thoracic spinal cord, mildly flattening and compressing the spinal  cord anteriorly. Again, no cord signal changes. Collection is predominantly simple in appearance, with no intradural or epidural hematoma identified. Possible CSF leak not excluded. 2. Postoperative changes from posterior decompression with fusion with associated collections along the midline incision and at the laminectomy site as above. Benign postoperative seromas are favored. 3. Underlying multilevel cervical spondylosis as detailed above. Otherwise no other new or progressive findings as compared to recent MRI from 02/14/2019. Electronically Signed   By: Jeannine Boga M.D.   On: 04/01/2019 23:37    Pending Labs Unresulted Labs (From admission, onward)    Start     Ordered   04/02/19 0500  Protime-INR  Tomorrow morning,   R  04/01/19 2041   04/01/19 2101  SARS CORONAVIRUS 2 (TAT 6-24 HRS) Nasopharyngeal Nasopharyngeal Swab  (Tier 3 (TAT 6-24 hrs))  Once,   STAT    Question Answer Comment  Is this test for diagnosis or screening Screening   Symptomatic for COVID-19 as defined by CDC No   Hospitalized for COVID-19 No   Admitted to ICU for COVID-19 No   Previously tested for COVID-19 Yes   Resident in a congregate (group) care setting No   Employed in healthcare setting No   Pregnant No      04/01/19 2100          Vitals/Pain Today's Vitals   04/01/19 2045 04/01/19 2100 04/01/19 2310 04/01/19 2316  BP: (!) 136/54 (!) 138/56  (!) 124/47  Pulse: 68 74  74  Resp: (!) 24 (!) 24  (!) 22  Temp:      TempSrc:      SpO2: 95% 97%  94%  PainSc:   8      Isolation Precautions No active isolations  Medications Medications  0.9 %  sodium chloride infusion ( Intravenous New Bag/Given 04/01/19 2307)  acetaminophen (TYLENOL) tablet 650 mg (650 mg Oral Given 04/01/19 2314)    Or  acetaminophen (TYLENOL) suppository 650 mg ( Rectal See Alternative 04/01/19 2314)  oxyCODONE (Oxy IR/ROXICODONE) immediate release tablet 5 mg (has no administration in time range)  senna-docusate  (Senokot-S) tablet 1 tablet (has no administration in time range)  ondansetron (ZOFRAN) tablet 4 mg (has no administration in time range)    Or  ondansetron (ZOFRAN) injection 4 mg (has no administration in time range)  allopurinol (ZYLOPRIM) tablet 300 mg (has no administration in time range)  nebivolol (BYSTOLIC) tablet 10 mg (has no administration in time range)  torsemide (DEMADEX) tablet 20 mg (has no administration in time range)  sertraline (ZOLOFT) tablet 100 mg (has no administration in time range)  pantoprazole (PROTONIX) EC tablet 40 mg (has no administration in time range)  cyclobenzaprine (FLEXERIL) tablet 10 mg (has no administration in time range)  potassium chloride (KLOR-CON) CR tablet 10 mEq (10 mEq Oral Given 04/01/19 2315)  Vitamin D (Ergocalciferol) (DRISDOL) capsule 50,000 Units (has no administration in time range)  dexamethasone (DECADRON) injection 10 mg (10 mg Intravenous Given 04/01/19 2303)    Mobility walks Low fall risk   Focused Assessments Musculoskeletal   R Recommendations: See Admitting Provider Note  Report given to:   Additional Notes:

## 2019-04-01 NOTE — ED Provider Notes (Addendum)
[facility] EMERGENCY DEPARTMENT Provider Note   CSN: [id number] Arrival date & time: 04/01/19  1709     History Chief Complaint  Patient presents with  . Post-op Problem    Natasha Lawson is a 78 y.o. female with PMH significant for cervical myelopathy status post C5-C7 decompression and instrumented fusion performed 03/29/2019 by Dr. Zada Finders who presents to the ED with complaints of generalized weakness and difficulty walking.  Patient reports that she was able to ambulate immediately prior to discharge, but after driving home to [facility], her legs immediately gave out when she climbed out of the vehicle.  Since then she reports that she has been unable to ambulate and reports intermittent "electric pains" diffusely as well as subjective numbness and weakness.  Patient went to  [facility] ED last night with same complaint.  Imaging was obtained and negative for acute pathology and she was subsequently discharged home.  Her husband had spoken with Dr. Zada Finders who advised her to come to the ED for further evaluation.  She denies any fevers, chills, shortness of breath or chest pain, or any other symptoms at this time.  HPI     Past Medical History:  Diagnosis Date  . Anxiety   . Cancer [facility])    breast cancer right  . Dysrhythmia    A-Fib on coumadin  . GERD (gastroesophageal reflux disease)   . Gout   . 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 History of blood transfusion   . Hypertension   . Sleep apnea    uses CPAP nightly    Patient Active Problem List   Diagnosis Date Noted  . Cervical myelopathy ([place]) 03/29/2019    Past Surgical History:  Procedure Laterality Date  . ABDOMINAL HYSTERECTOMY    . BREAST SURGERY Right 08/14/2014   lumpectomy/lynph nodes  . BREAST SURGERY Right 10/05/2016   mass removed  . CARDIAC CATHETERIZATION    . CARPAL TUNNEL RELEASE Bilateral 10/01/11, 12/02/11  . CHOLECYSTECTOMY  1987  . COLONOSCOPY    .  DILATION AND CURETTAGE OF UTERUS  10/10/2008   hysteroscopy with biopsy  . EYE SURGERY Bilateral    removed cataracts  . HYSTEROSCOPY WITH D & C  08/19/2011  . INCONTINENCE SURGERY  01/11/2007  . JOINT REPLACEMENT Right 10/18/2013  . laparoscopy bilateral salpingo-oophorectomy  01/27/2012  . NECK SURGERY  11/16/2017  . pci lad  01/02/2015   stent   . POSTERIOR CERVICAL FUSION/FORAMINOTOMY N/A 03/29/2019   Procedure: Cervical Five to Cervical Seven Posterior cervical laminectomy and instrumented fusion;  Surgeon: Judith Part, MD;  Location: [facility];  Service: Neurosurgery;  Laterality: N/A;  Cervical Five to Cervical Seven Posterior cervical laminectomy and instrumented fusion  . radiation breast canceer Right    from 12/08/14-01/12/15  . rotator cuff  Right 04/26/2010   repair  . TONSILLECTOMY  1954  . TUBAL LIGATION  1984  . UPPER GI ENDOSCOPY  2016     OB History   No obstetric history on file.     No family history on file.  Social History   Tobacco Use  . Smoking status: Never Smoker  . Smokeless tobacco: Never Used  Substance Use Topics  . Alcohol use: Never  . Drug use: Never    Home Medications Prior to Admission medications   Medication Sig Start Date End Date Taking? Authorizing Provider  allopurinol (ZYLOPRIM) 300 MG tablet Take 300 mg by mouth daily. 03/07/19  [provider]  BYSTOLIC 20 MG TABS Take 10 mg by mouth daily. 03/11/19   [provider]  cyclobenzaprine (FLEXERIL) 10 MG tablet Take 1 tablet (10 mg total) by mouth 3 (three) times daily as needed for muscle spasms. 03/31/19   Judith Part, MD  omeprazole (PRILOSEC) 20 MG capsule Take 20 mg by mouth daily before breakfast. 12/14/18   [provider]  oxyCODONE (OXY IR/ROXICODONE) 5 MG immediate release tablet Take 1 tablet (5 mg total) by mouth every 4 (four) hours as needed (pain). 03/31/19   Judith Part, MD  potassium chloride (MICRO-K) 10 MEQ CR  capsule Take 10 mEq by mouth 2 (two) times daily. 11/09/18   [provider]  sertraline (ZOLOFT) 100 MG tablet Take 100 mg by mouth daily. 03/18/19   [provider]  torsemide (DEMADEX) 20 MG tablet Take 20 mg by mouth daily. use as directed 12/21/18   [provider]  Vitamin D, Ergocalciferol, (DRISDOL) 1.25 MG (50000 UT) CAPS capsule Take 50,000 Units by mouth once a week. 01/17/19   [provider]  warfarin (COUMADIN) 2 MG tablet Take 1 tablet (2 mg total) by mouth See admin instructions. Take 2 mg by mouth on Tuesday, Wednesday, Thursday, Saturday and Sunday  Restart warfarin on 04/03/19 03/31/19   Judith Part, MD  warfarin (COUMADIN) 3 MG tablet Take 1 tablet (3 mg total) by mouth See admin instructions. Take 3 mg by mouth on Monday and Friday  Restart warfarin on 04/03/19 03/31/19   Judith Part, MD    Allergies    Capoten [captopril], Iodine, Norvasc [amlodipine besylate], Shellfish allergy, Terazosin hcl, Xarelto [rivaroxaban], Welchol [colesevelam hcl], Crestor [rosuvastatin calcium], Fenofibrate, Pravachol [pravastatin sodium], Spironolactone, and Statins  Review of Systems   Review of Systems  Constitutional: Positive for fever. Negative for chills.  Respiratory: Negative for shortness of breath.   Cardiovascular: Negative for chest pain.  Musculoskeletal: Positive for back pain.  Neurological: Positive for weakness and numbness.    Physical Exam Updated Vital Signs BP (!) 139/51   Pulse 74   Temp 98.2 F (36.8 C) (Oral)   Resp (!) 27   LMP  (LMP Unknown)   SpO2 93%   Physical Exam Vitals and nursing note reviewed. Exam conducted with a chaperone present.  Constitutional:      Appearance: Normal appearance.  HENT:     Head: Normocephalic and atraumatic.  Eyes:     General: No scleral icterus.    Conjunctiva/sclera: Conjunctivae normal.  Cardiovascular:     Rate and Rhythm: Normal rate and regular rhythm.      Pulses: Normal pulses.     Heart sounds: Normal heart sounds.  Pulmonary:     Effort: Pulmonary effort is normal. No respiratory distress.     Breath sounds: Normal breath sounds.  Skin:    General: Skin is dry.  Neurological:     General: No focal deficit present.     Mental Status: She is alert and oriented to person, place, and time.     GCS: GCS eye subscore is 4. GCS verbal subscore is 5. GCS motor subscore is 6.     Cranial Nerves: No cranial nerve deficit.     Sensory: No sensory deficit.     Motor: Weakness present.  Psychiatric:        Mood and Affect: Mood normal.        Behavior: Behavior normal.        Thought  Content: Thought content normal.     ED Results / Procedures / Treatments   Labs (all labs ordered are listed, but only abnormal results are displayed) Labs Reviewed  BASIC METABOLIC PANEL  CBC  PROTIME-INR    EKG None  Radiology No results found.  Procedures Procedures (including critical care time)  Medications Ordered in ED Medications  0.9 %  sodium chloride infusion (has no administration in time range)  acetaminophen (TYLENOL) tablet 650 mg (has no administration in time range)    Or  acetaminophen (TYLENOL) suppository 650 mg (has no administration in time range)  oxyCODONE (Oxy IR/ROXICODONE) immediate release tablet 5 mg (has no administration in time range)  senna-docusate (Senokot-S) tablet 1 tablet (has no administration in time range)  ondansetron (ZOFRAN) tablet 4 mg (has no administration in time range)    Or  ondansetron (ZOFRAN) injection 4 mg (has no administration in time range)  allopurinol (ZYLOPRIM) tablet 300 mg (has no administration in time range)  Nebivolol HCl TABS 10 mg (has no administration in time range)  torsemide (DEMADEX) tablet 20 mg (has no administration in time range)  sertraline (ZOLOFT) tablet 100 mg (has no administration in time range)  pantoprazole (PROTONIX) EC tablet 40 mg (has no administration in  time range)  cyclobenzaprine (FLEXERIL) tablet 10 mg (has no administration in time range)  potassium chloride (KLOR-CON) CR tablet 10 mEq (has no administration in time range)  Vitamin D (Ergocalciferol) (DRISDOL) capsule 50,000 Units (has no administration in time range)  dexamethasone (DECADRON) injection 10 mg (has no administration in time range)    ED Course  I have reviewed the triage vital signs and the nursing notes.  Pertinent labs & imaging results that were available during my care of the patient were reviewed by me and considered in my medical decision making (see chart for details).  Clinical Course as of Mar 31 2045  Fri Apr 01, 2019  2004 Pleasant 78 year old female who just had cervical neck surgery here with increased generalized weakness and difficulty ambulating and getting from sitting to standing position.  We reached out to neurosurgery and there can come down and evaluate the patient.  Patient updated on plan.   [MB]    Clinical Course User Index [MB] Hayden Rasmussen, MD   MDM Rules/Calculators/A&P                       Patient presents to the ED with postop complications, I notify [facility] of patient's arrival and will appreciate their consult.  Patient is in no acute distress and is hemodynamically stable.  She appears neurovascularly intact on my examination.  She does not complain of any significant pain or discomfort at this time.  Patient evidently had a noncontrast CT obtained of the head and C-spine yesterday at the [facility] ER, which evidently did not yield any concerning findings that she was subsequently discharged home.  We will consult with neurosurgery to determine next steps for evaluation.  Spoke with Reinaldo Meeker NP and she reports that Dr. Trenton Gammon will soon evaluate the patient and decide on what imaging there may or may not need to be obtained at this time.  8:46 PM Dr. Trenton Gammon has evaluated patient and will assume care. They will  obtain MR of Cervical Spine WO Contrast and have patient admitted for continued observation.    Final Clinical Impression(s) / ED Diagnoses Final diagnoses:  Weakness    Rx / DC Orders ED Discharge  Orders    None       Reita Chard 04/01/19 2046    Hayden Rasmussen, MD 04/02/19 [place], PA-C 04/13/19 1200    Hayden Rasmussen, MD 04/13/19 [phone]

## 2019-04-01 NOTE — H&P (Signed)
Natasha Lawson is an 78 y.o. female.   Chief Complaint: Weakness HPI: 78 year old female remotely status post C6 corpectomy with severe myelopathy prior to her surgery and worsening of her myelopathy after her surgery with severe quadriparesis.  Patient symptoms gradually improved with time but had continued symptoms of myelopathy.  Dr. Venetia Constable evaluated her and found her to have continued cervical stenosis with cord compression.  He recently performed posterior cervical decompression with posterior lateral fusion.  Patient's hospital course was uncomplicated.  She was discharged home in good condition but upon discharge home the patient reports that she has had some progressive weakness.  She also notes that she had 1 fall.  She notes some occasional electrical sensations extending into her arms and legs.  She has some generalized weakness which she feels is worse than where she was a few days ago.  She is still continent of urine.  She has no fever.  She has no wound issues.  Past Medical History:  Diagnosis Date  . Anxiety   . Cancer [facility])    breast cancer right  . Dysrhythmia    A-Fib on coumadin  . GERD (gastroesophageal reflux disease)   . Gout   . 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 History of blood transfusion   . Hypertension   . Sleep apnea    uses CPAP nightly    Past Surgical History:  Procedure Laterality Date  . ABDOMINAL HYSTERECTOMY    . BREAST SURGERY Right 08/14/2014   lumpectomy/lynph nodes  . BREAST SURGERY Right 10/05/2016   mass removed  . CARDIAC CATHETERIZATION    . CARPAL TUNNEL RELEASE Bilateral 10/01/11, 12/02/11  . CHOLECYSTECTOMY  1987  . COLONOSCOPY    . DILATION AND CURETTAGE OF UTERUS  10/10/2008   hysteroscopy with biopsy  . EYE SURGERY Bilateral    removed cataracts  . HYSTEROSCOPY WITH D & C  08/19/2011  . INCONTINENCE SURGERY  01/11/2007  . JOINT REPLACEMENT Right 10/18/2013  . laparoscopy bilateral salpingo-oophorectomy  01/27/2012  .  NECK SURGERY  11/16/2017  . pci lad  01/02/2015   stent   . POSTERIOR CERVICAL FUSION/FORAMINOTOMY N/A 03/29/2019   Procedure: Cervical Five to Cervical Seven Posterior cervical laminectomy and instrumented fusion;  Surgeon: Judith Part, MD;  Location: [facility];  Service: Neurosurgery;  Laterality: N/A;  Cervical Five to Cervical Seven Posterior cervical laminectomy and instrumented fusion  . radiation breast canceer Right    from 12/08/14-01/12/15  . rotator cuff  Right 04/26/2010   repair  . TONSILLECTOMY  1954  . TUBAL LIGATION  1984  . UPPER GI ENDOSCOPY  2016    No family history on file. Social History:  reports that she has never smoked. She has never used smokeless tobacco. She reports that she does not drink alcohol or use drugs.  Allergies:  Allergies  Allergen Reactions  . Capoten [Captopril] Swelling    Tongue   . Iodine Hives  . Norvasc [Amlodipine Besylate] Swelling  . Shellfish Allergy Hives  . Terazosin Hcl Other (See Comments)    Caused a-fib  . Xarelto [Rivaroxaban] Other (See Comments)    Severe back pain  . Welchol [Colesevelam Hcl] Other (See Comments)    Confusion   . Crestor [Rosuvastatin Calcium] Other (See Comments)    Muscle aches  . Fenofibrate Other (See Comments)    Muscle aches  . Pravachol [Pravastatin Sodium] Other (See Comments)    fatigue  . Spironolactone Diarrhea  .  Statins Other (See Comments)    Joint pain    (Not in a hospital admission)   No results found for this or any previous visit (from the past 48 hour(s)). No results found.  Pertinent items noted in HPI and remainder of comprehensive ROS otherwise negative.  Blood pressure (!) 139/51, pulse 74, temperature 98.2 F (36.8 C), temperature source Oral, resp. rate (!) 27, SpO2 93 %.  Patient is awake and alert.  She is oriented and appropriate.  Her speech is fluent.  Her judgment insight are intact.  Cranial nerve function normal bilateral.  Motor examination reveals  4+/5 strength in her right upper extremity and 4+/5 strength in her right lower extremity.  Left upper and left lower extremity strength is near normal.  Wound is clean and dry.  Chest and abdomen are benign.  Extremities are free from injury formally. Assessment/Plan Patient with worsening neck pain and weakness.  Plan to admit and get MRI scan to rule out epidural hematoma.  Hold anticoagulation for now.  Cooper Render Xaivier Malay 04/01/2019, 8:31 PM

## 2019-04-02 ENCOUNTER — Inpatient Hospital Stay (HOSPITAL_COMMUNITY): Payer: Medicare Other

## 2019-04-02 ENCOUNTER — Encounter (HOSPITAL_COMMUNITY): Payer: Self-pay | Admitting: Neurological Surgery

## 2019-04-02 ENCOUNTER — Other Ambulatory Visit: Payer: Self-pay

## 2019-04-02 LAB — PROTIME-INR
INR: 1.1 (ref 0.8–1.2)
Prothrombin Time: 14.4 seconds (ref 11.4–15.2)

## 2019-04-02 LAB — SARS CORONAVIRUS 2 (TAT 6-24 HRS): SARS Coronavirus 2: NEGATIVE

## 2019-04-02 IMAGING — DX DG SHOULDER 2+V*R*
2 series · 2 of 2 positions shown · non-contrast
Comparison: None.

CLINICAL DATA: Shoulder pain with numbness to the right hand. No
trauma. Recent neck surgery

EXAM:
RIGHT SHOULDER - 2+ VIEW

[shoulder axial]
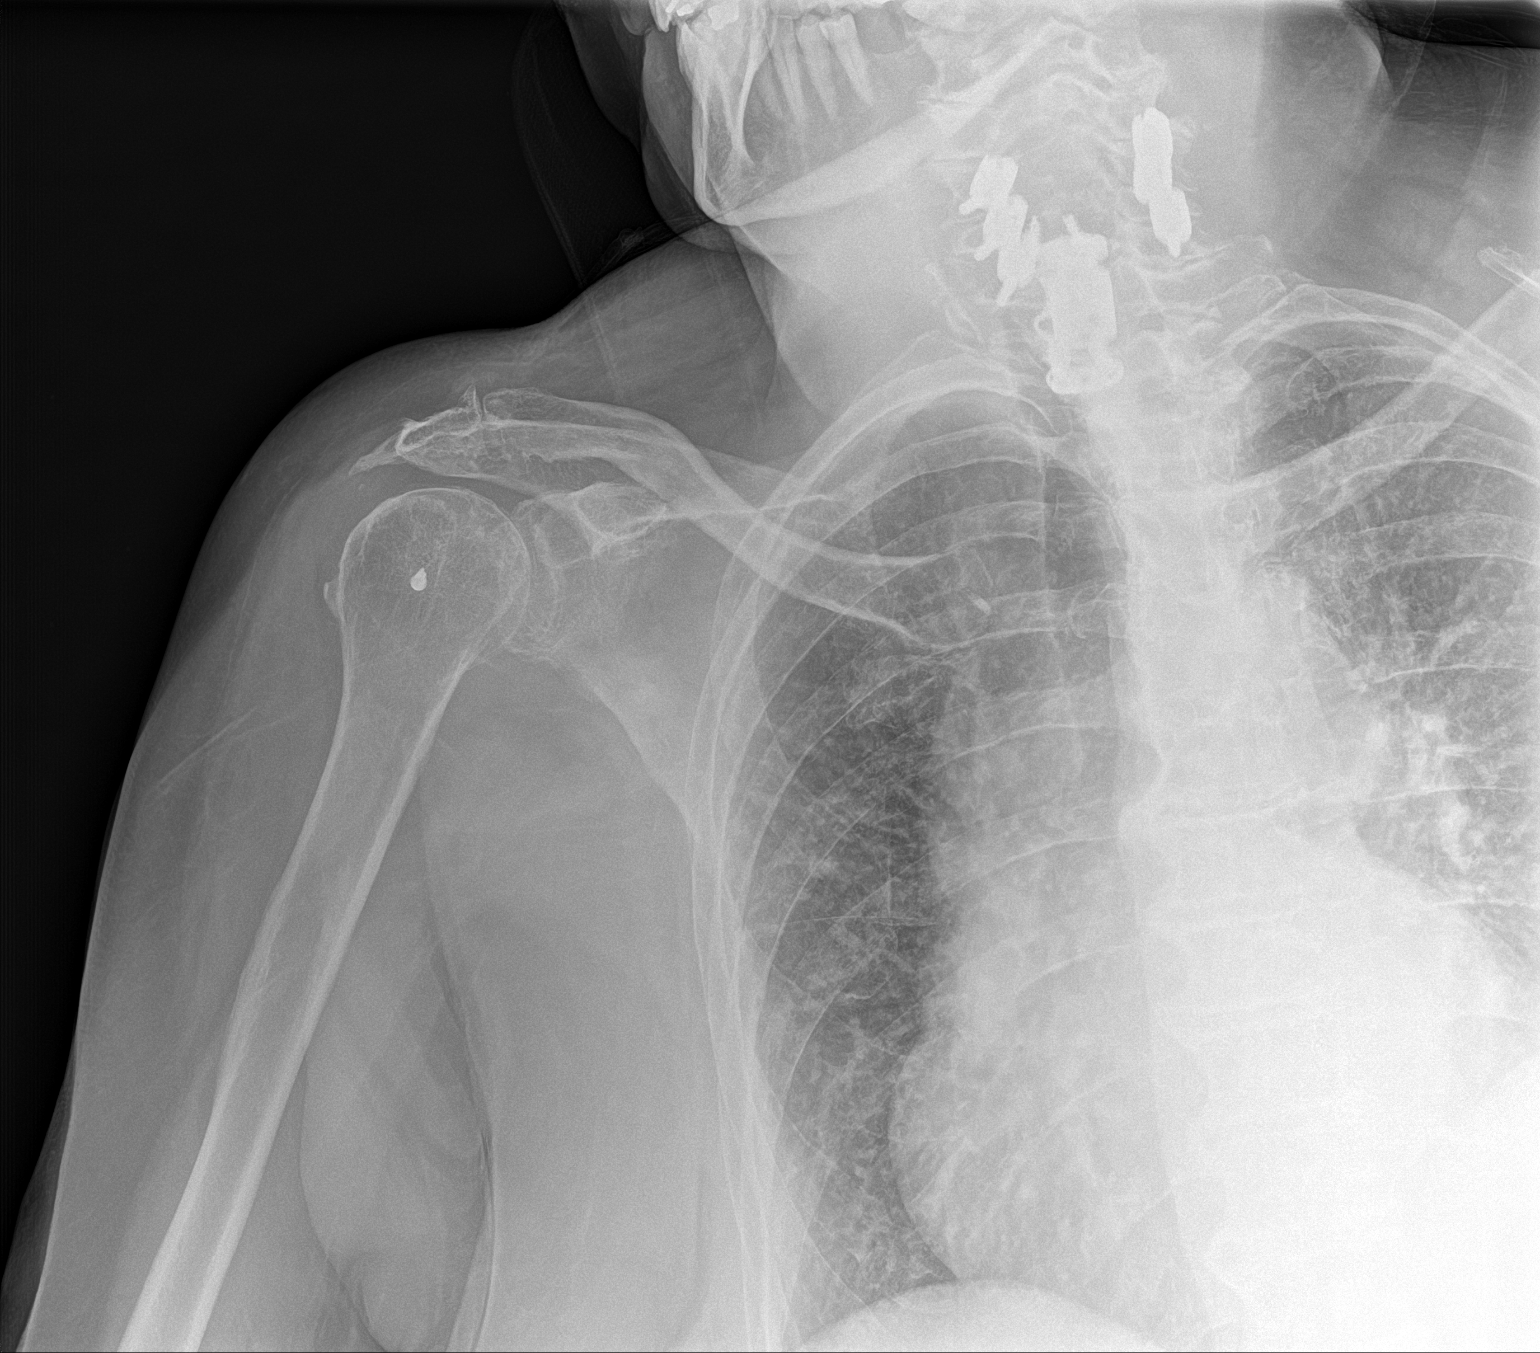

[shoulder obl]
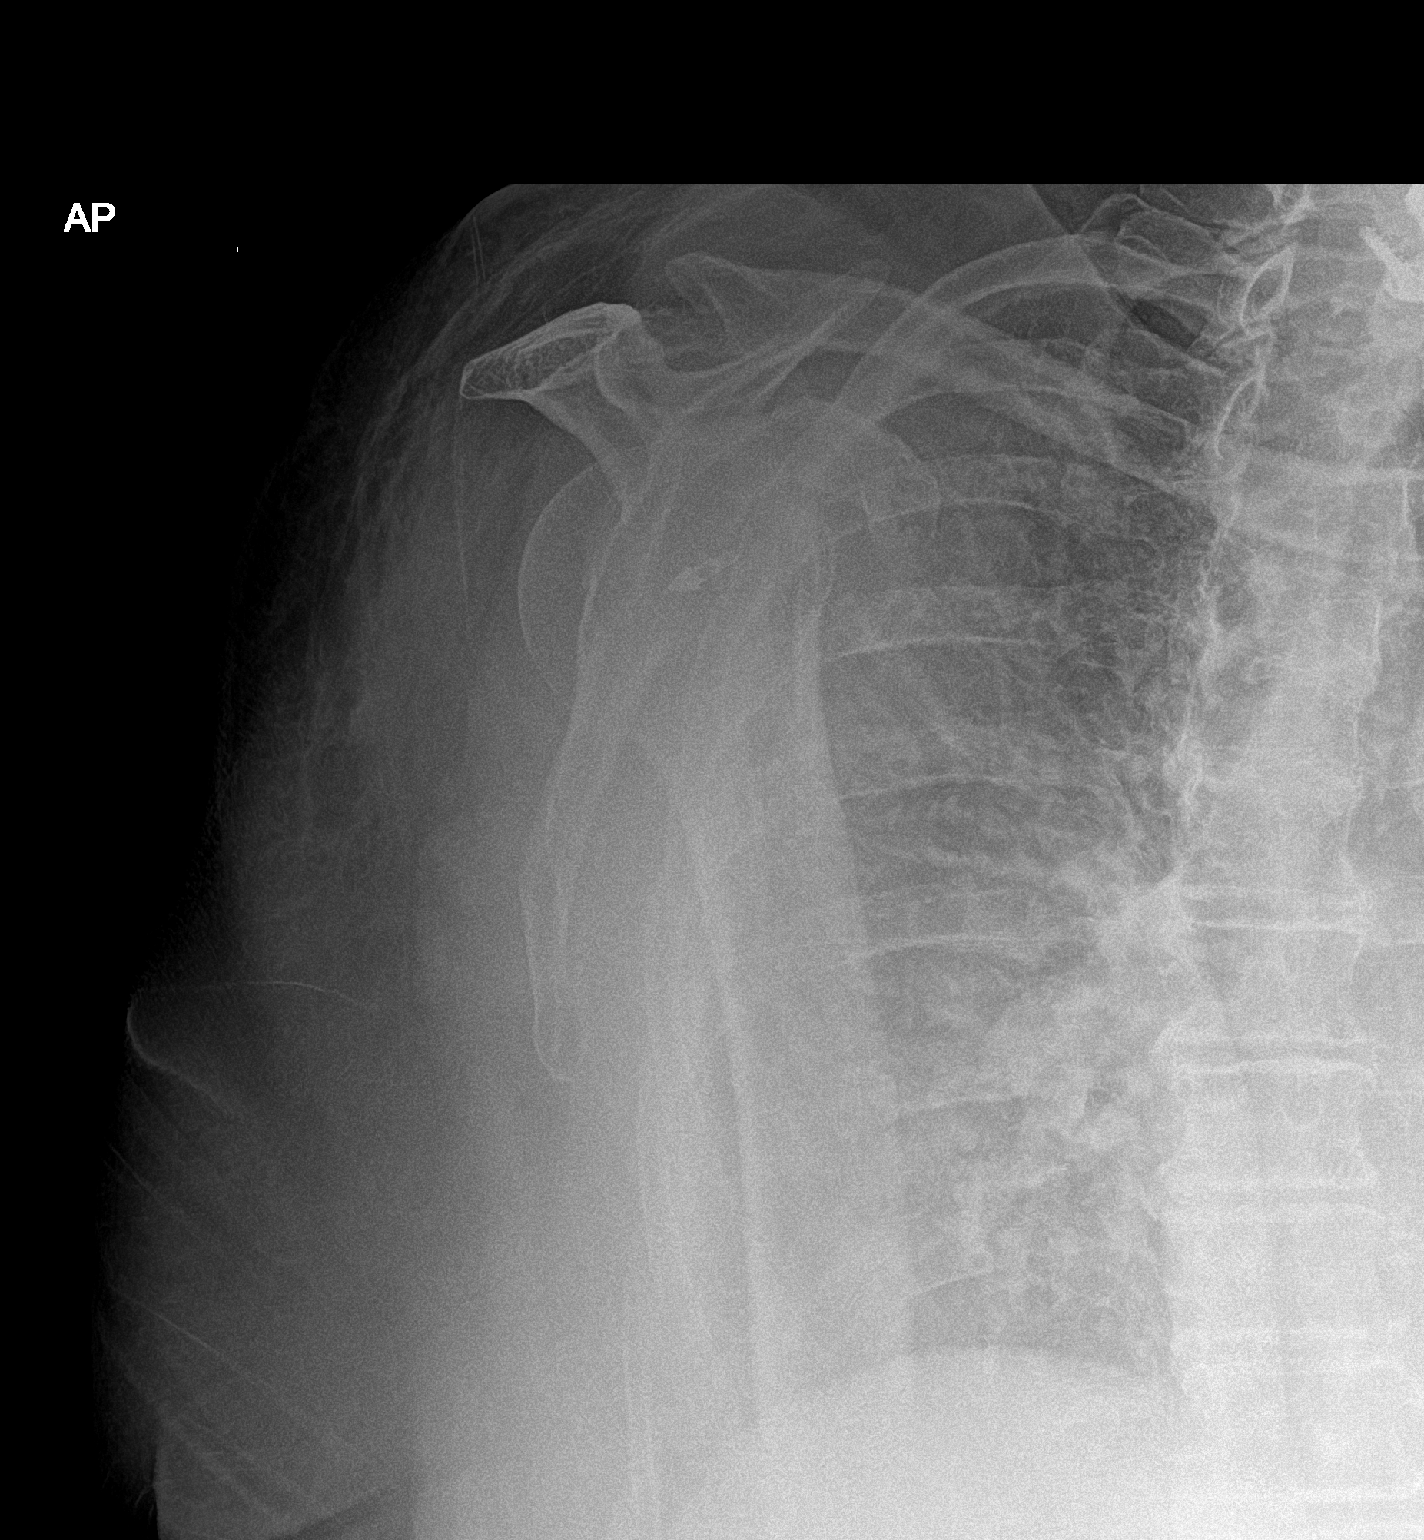

[2 of 2 positions shown; findings below may reference images not displayed]

FINDINGS: Prior rotator cuff surgery. There is degenerative spurring at the AC
joint and from the lateral acromion. Generalized osteopenia. No
evidence of fracture or bone lesion.

Postoperative cervical spine.

Enlarged appearance of the heart.
IMPRESSION: 1. No acute finding.
2. Degenerative spurring at the AC joint and lateral acromion.
3. Postoperative rotator cuff.

## 2019-04-02 NOTE — Progress Notes (Signed)
NP on call informed that patient is going to need c-pap patient uses this nightly. Also patient inquired about diet stated that last meal was 12/17 1900. Was instructed to keep NPO patient informed and RT was notified of new c-pap order/ Arthor Captain LPN

## 2019-04-02 NOTE — Progress Notes (Signed)
Overall stable.  Patient awake and alert this morning.  She is pleasant and cooperative.  She does not appear to be any distress.  Her overall complaints of generalized weakness are stable.  She also complains of right shoulder pain.  She is having no new symptoms of sensory loss or weakness.  Follow-up MRI scanning last night demonstrated evidence of prior posterior cervical decompression with posterior lateral fusion with instrumentation.  There is no evidence of any compressive hematoma.  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  Patient status post posterior cervical decompression and fusion.  She failed discharge home secondary to pain and mobility issues.  At this point I think she appears to be adequately decompressed and I see no evidence of perioperative complication.  I think she will probably need either inpatient rehabilitation or skilled nursing facility placement for further convalescence prior to discharge home.

## 2019-04-02 NOTE — Evaluation (Signed)
Occupational Therapy Evaluation Patient Details Name: Natasha Lawson MRN: [id number] DOB: 08/09/1940 Today's Date: 04/02/2019    History of Present Illness Pt is a 78 yo female s/p C5-7 posterior cervical laminectomy and instrumented fusion on 12/15. Pt developing progressive weakness and x2 falls at home and pain in R shoulder. xray revealing no acute abnormalities, only bone spurring. PMHx: previous neck sx.   Clinical Impression   Pt PTA: pt living with spouse and experiencing 2 falls at home and sent back to ER. Pt currently limited by pain in R shoulder decreased ability to care for self with weakness all over and pain. RUE: 50* shoulder flex ROM; WFLs for elbow, wrist and hand. Numbness in middle finger. Pt limited by pain in R shoulder decreased ability to care for self with weakness all over and pain. Pt minA to maxA/totalA for ADL tasks due to weakness and poor use of BUEs. Pt modA to maxA for sit to stands and taking a few steps to [facility]. Pt leaning on bed for task so balance is poor with RW. Pt would benefit from continued OT skilled services for ADL, mobility and safety. Pt highly motivated to return to PLOF. OT following acutely.      Follow Up Recommendations  CIR    Equipment Recommendations  Other (comment)(to be determined)    Recommendations for Other Services       Precautions / Restrictions Precautions Precautions: Cervical Precaution Booklet Issued: No Precaution Comments: reviewed cervical precautions Required Braces or Orthoses: Cervical Brace Cervical Brace: Soft collar;At all times Restrictions Weight Bearing Restrictions: No      Mobility Bed Mobility Overal bed mobility: Needs Assistance Bed Mobility: Sidelying to Sit;Sit to Sidelying;Rolling Rolling: Min guard Sidelying to sit: Mod assist;HOB elevated     Sit to sidelying: Mod assist;Max assist General bed mobility comments: Assist for trunk elevation for sidelying to sit and assist for BLEs lifting  onto bed for sit to sidelying.  Transfers Overall transfer level: Needs assistance Equipment used: Rolling walker (2 wheeled) Transfers: Sit to/from Stand Sit to Stand: Max assist;Mod assist;From elevated surface         General transfer comment: maxA for power up x1 time and then modA x1 time    Balance Overall balance assessment: Needs assistance;History of Falls Sitting-balance support: No upper extremity supported;Feet supported;Single extremity supported Sitting balance-Leahy Scale: Good     Standing balance support: Bilateral upper extremity supported Standing balance-Leahy Scale: Poor Standing balance comment: Reliant on UE support                           ADL either performed or assessed with clinical judgement   ADL Overall ADL's : Needs assistance/impaired Eating/Feeding: Set up;Sitting   Grooming: Set up;Sitting   Upper Body Bathing: Moderate assistance;Sitting;Cueing for safety   Lower Body Bathing: Maximal assistance;+2 for physical assistance;+2 for safety/equipment;Cueing for safety;Cueing for sequencing;Sitting/lateral leans;Sit to/from stand   Upper Body Dressing : Moderate assistance;Sitting;Cueing for safety   Lower Body Dressing: Maximal assistance;+2 for physical assistance;+2 for safety/equipment;Sitting/lateral leans;Sit to/from stand;Cueing for safety   Toilet Transfer: Maximal assistance;+2 for physical assistance;+2 for safety/equipment;RW;Stand-pivot;BSC Toilet Transfer Details (indicate cue type and reason): side stepping towards HOB; unsteady for [facility] transfer today Toileting- Clothing Manipulation and Hygiene: Maximal assistance;+2 for physical assistance;+2 for safety/equipment;Sitting/lateral lean;Sit to/from stand;Cueing for safety       Functional mobility during ADLs: Moderate assistance;Maximal assistance;Rolling walker;+2 for safety/equipment;+2 for physical assistance General ADL Comments: Pt  limited by pain in R shoulder  decreased ability to care for self with weakness all over and pain.     Vision Baseline Vision/History: No visual deficits Patient Visual Report: No change from baseline Vision Assessment?: No apparent visual deficits     Perception     Praxis      Pertinent Vitals/Pain Pain Assessment: Faces Faces Pain Scale: Hurts even more Pain Location: R shoulder Pain Descriptors / Indicators: Aching;Burning;Operative site guarding;Discomfort Pain Intervention(s): Limited activity within patient's tolerance     Hand Dominance Right   Extremity/Trunk Assessment Upper Extremity Assessment Upper Extremity Assessment: Generalized weakness RUE Deficits / Details: 50* shoulder flex ROM; WFLs for elbow, wrist and hand. Numbness in middle finger RUE Coordination: decreased fine motor;decreased gross motor LUE Deficits / Details: 60* AROM all below the shoulder movements LUE Coordination: decreased fine motor   Lower Extremity Assessment Lower Extremity Assessment: Generalized weakness   Cervical / Trunk Assessment Cervical / Trunk Assessment: Other exceptions Cervical / Trunk Exceptions: head forward and rounded shoulders; s/p neck sx   Communication Communication Communication: No difficulties   Cognition Arousal/Alertness: Awake/alert Behavior During Therapy: WFL for tasks assessed/performed Overall Cognitive Status: Within Functional Limits for tasks assessed                                     General Comments  Pt feels that a SNF for CIR would be best due to falls    Exercises Exercises: Other exercises Other Exercises Other Exercises: elbow, wrist and hand; PROM to shoulders   Shoulder Instructions      Home Living Family/patient expects to be discharged to:: Private residence Living Arrangements: Spouse/significant other Available Help at Discharge: Family;Available 24 hours/day Type of Home: House Home Access: Ramped entrance     Home Layout: One  level     Bathroom Shower/Tub: Occupational psychologist: Standard     Home Equipment: Environmental consultant - 2 wheels;Walker - 4 wheels;Adaptive equipment Adaptive Equipment: Reacher;Sock aid;Long-handled shoe horn;Long-handled sponge Additional Comments: pt lives with her husband but he has back issues as well as a torn meniscus      Prior Functioning/Environment Level of Independence: Needs assistance  Gait / Transfers Assistance Needed: ambulated with rollator, frequent falls ADL's / Homemaking Assistance Needed: husband assisted with ADL's as needed, pt could dress herself            OT Problem List: Decreased strength;Decreased activity tolerance;Impaired balance (sitting and/or standing);Decreased safety awareness;Impaired UE functional use;Pain;Increased edema      OT Treatment/Interventions: Self-care/ADL training;Energy conservation;DME and/or AE instruction;Therapeutic activities;Balance training;Patient/family education;Therapeutic exercise;Neuromuscular education    OT Goals(Current goals can be found in the care plan section) Acute Rehab OT Goals Patient Stated Goal: to go to rehab to get stronger OT Goal Formulation: With patient Time For Goal Achievement: 04/16/19 Potential to Achieve Goals: Good ADL Goals Pt Will Perform Grooming: with min guard assist;standing Pt Will Perform Upper Body Dressing: with set-up;sitting Pt Will Perform Lower Body Dressing: with min guard assist;with adaptive equipment;sitting/lateral leans Pt Will Transfer to Toilet: with min assist;stand pivot transfer;bedside commode Pt Will Perform Toileting - Clothing Manipulation and hygiene: with min assist;sit to/from stand;sitting/lateral leans Pt/caregiver will Perform Home Exercise Program: Increased strength;Both right and left upper extremity;Increased ROM;With Supervision  OT Frequency: Min 2X/week   Barriers to D/C: Decreased caregiver support  decreased physical assist  Co-evaluation              AM-PAC OT "6 Clicks" Daily Activity     Outcome Measure Help from another person eating meals?: A Little Help from another person taking care of personal grooming?: A Lot Help from another person toileting, which includes using toliet, bedpan, or urinal?: A Lot Help from another person bathing (including washing, rinsing, drying)?: A Lot Help from another person to put on and taking off regular upper body clothing?: A Lot Help from another person to put on and taking off regular lower body clothing?: Total 6 Click Score: 12   End of Session Equipment Utilized During Treatment: Gait belt;Rolling walker;Cervical collar Nurse Communication: Mobility status  Activity Tolerance: Patient limited by pain Patient left: in bed;with call bell/phone within reach;with bed alarm set  OT Visit Diagnosis: Muscle weakness (generalized) (M62.81);Pain Pain - Right/Left: Right Pain - part of body: Shoulder                Time: [id number] OT Time Calculation (min): 30 min Charges:  OT General Charges $OT Visit: 1 Visit OT Evaluation $OT Eval Moderate Complexity: 1 Mod OT Treatments $Therapeutic Activity: 8-22 mins  Ebony Hail Harold Hedge) Marsa Aris OTR/L Acute Rehabilitation Services Pager: [phone] Office: [facility] 04/02/2019, 4:10 PM

## 2019-04-02 NOTE — Evaluation (Signed)
Physical Therapy Evaluation Patient Details Name: Natasha Lawson MRN: [id number] DOB: 1941-01-04 Today's Date: 04/02/2019   History of Present Illness  Pt is a 78 yo female s/p C5-7 posterior cervical laminectomy and instrumented fusion on 12/15. Pt developing progressive weakness and x2 falls at home and pain in R shoulder. xray revealing no acute abnormalities, only bone spurring. PMHx: previous neck sx.  Clinical Impression  Pt presents to PT with deficits in strength, power, sensation, functional mobility, gait, and balance. Pt demonstrating functional strength deficits and requiring physical assistance during all aspects of mobility to reduce falls risk and maintain cervical precautions. Pt lacking LE power to achieve standing without assistance, and hesitant in ambulation due to strength deficits. Pt will benefit from acute PT POC to improve ambulation tolerance and LE strength while reducing falls risk.     Follow Up Recommendations CIR;Supervision/Assistance - 24 hour    Equipment Recommendations  (defer to post-acute setting)    Recommendations for Other Services       Precautions / Restrictions Precautions Precautions: Cervical Precaution Booklet Issued: No Precaution Comments: reviewed cervical precautions Required Braces or Orthoses: Cervical Brace Cervical Brace: Soft collar;At all times Restrictions Weight Bearing Restrictions: No      Mobility  Bed Mobility Overal bed mobility: Needs Assistance Bed Mobility: Rolling;Sidelying to Sit;Sit to Sidelying Rolling: Min assist Sidelying to sit: Mod assist     Sit to sidelying: Mod assist General bed mobility comments: assist for trunk elevation in sidelying to sit, assist with LEs for sit to sidelying  Transfers Overall transfer level: Needs assistance Equipment used: Rolling walker (2 wheeled) Transfers: Sit to/from Stand Sit to Stand: Mod assist         General transfer comment: modA with BUE support of  RW  Ambulation/Gait Ambulation/Gait assistance: Min assist Gait Distance (Feet): 2 Feet(forward and backward) Assistive device: Rolling walker (2 wheeled) Gait Pattern/deviations: Step-to pattern Gait velocity: reduced Gait velocity interpretation: <1.31 ft/sec, indicative of household ambulator General Gait Details: pt with slow and cautious steps, more willing to step with RLE vs LLE at this time  Science writer    Modified Rankin (Stroke Patients Only)       Balance Overall balance assessment: Needs assistance Sitting-balance support: Bilateral upper extremity supported;Feet supported Sitting balance-Leahy Scale: Good Sitting balance - Comments: supervision   Standing balance support: Bilateral upper extremity supported Standing balance-Leahy Scale: Fair Standing balance comment: minG with BUE support                             Pertinent Vitals/Pain Pain Assessment: Faces Faces Pain Scale: Hurts little more Pain Location: R shoulder Pain Descriptors / Indicators: Aching Pain Intervention(s): Limited activity within patient's tolerance    Home Living Family/patient expects to be discharged to:: Private residence Living Arrangements: Spouse/significant other Available Help at Discharge: Family;Available 24 hours/day Type of Home: House Home Access: Stairs to enter Entrance Stairs-Rails: None Entrance Stairs-Number of Steps: 2(half steps per pt) Home Layout: One level Home Equipment: Walker - 2 wheels;Walker - 4 wheels;Adaptive equipment Additional Comments: pt lives with her husband but he has back issues as well as a torn meniscus    Prior Function Level of Independence: Needs assistance   Gait / Transfers Assistance Needed: ambulated with rollator, frequent falls  ADL's / Homemaking Assistance Needed: husband assisted with ADL's as needed, pt could dress herself  Hand Dominance   Dominant Hand: Right     Extremity/Trunk Assessment   Upper Extremity Assessment Upper Extremity Assessment: Defer to OT evaluation RUE Deficits / Details: 50* shoulder flex ROM; WFLs for elbow, wrist and hand. Numbness in middle finger RUE Coordination: decreased fine motor;decreased gross motor LUE Deficits / Details: 60* AROM all below the shoulder movements LUE Coordination: decreased fine motor    Lower Extremity Assessment Lower Extremity Assessment: Generalized weakness(grossly 4/5 BLE, numbness reported in L foot)    Cervical / Trunk Assessment Cervical / Trunk Assessment: Other exceptions Cervical / Trunk Exceptions: soft collar  Communication   Communication: No difficulties  Cognition Arousal/Alertness: Awake/alert Behavior During Therapy: WFL for tasks assessed/performed Overall Cognitive Status: Within Functional Limits for tasks assessed                                        General Comments General comments (skin integrity, edema, etc.): pt expressing she would like a rehab facility close to [facility]    Exercises Other Exercises Other Exercises: elbow, wrist and hand; PROM to shoulders   Assessment/Plan    PT Assessment Patient needs continued PT services  PT Problem List Decreased strength;Decreased activity tolerance;Decreased balance;Decreased knowledge of use of DME;Decreased safety awareness;Pain       PT Treatment Interventions DME instruction;Gait training;Functional mobility training;Therapeutic activities;Therapeutic exercise;Balance training;Patient/family education;Neuromuscular re-education;Stair training    PT Goals (Current goals can be found in the Care Plan section)  Acute Rehab PT Goals Patient Stated Goal: to go to rehab to get stronger PT Goal Formulation: With patient Time For Goal Achievement: 04/16/19 Potential to Achieve Goals: Good    Frequency Min 5X/week   Barriers to discharge        Co-evaluation                AM-PAC PT "6 Clicks" Mobility  Outcome Measure Help needed turning from your back to your side while in a flat bed without using bedrails?: A Little Help needed moving from lying on your back to sitting on the side of a flat bed without using bedrails?: A Little Help needed moving to and from a bed to a chair (including a wheelchair)?: A Lot Help needed standing up from a chair using your arms (e.g., wheelchair or bedside chair)?: A Lot Help needed to walk in hospital room?: A Little Help needed climbing 3-5 steps with a railing? : A Lot 6 Click Score: 15    End of Session Equipment Utilized During Treatment: (none) Activity Tolerance: Patient tolerated treatment well Patient left: in bed;with call bell/phone within reach;with bed alarm set Nurse Communication: Mobility status PT Visit Diagnosis: Muscle weakness (generalized) (M62.81);Unsteadiness on feet (R26.81) Pain - part of body: Shoulder    Time: [id number] PT Time Calculation (min) (ACUTE ONLY): 21 min   Charges:   PT Evaluation $PT Eval Moderate Complexity: 1 Mod          Zenaida Niece, PT, DPT Acute Rehabilitation Pager: [phone]   Zenaida Niece 04/02/2019, 4:58 PM

## 2019-04-03 NOTE — Progress Notes (Signed)
  NEUROSURGERY PROGRESS NOTE   No issues overnight.  No change in motor/sensory Feels essentially at baseline  EXAM:  BP (!) 142/71 (BP Location: Left Arm)   Pulse 65   Temp 98.2 F (36.8 C) (Oral)   Resp 20   Ht 5\' 7"  (1.702 m)   Wt 113 kg   LMP  (LMP Unknown)   SpO2 97%   BMI 39.02 kg/m   Awake, alert, oriented  Speech fluent, appropriate  CN grossly intact  MAEW, stable motor/sensory Incision: c/d/i  PLAN Stable neurologically CIR candidate. Consult placed Continue supportive care

## 2019-04-03 NOTE — TOC Initial Note (Signed)
Transition of Care [facility]) - Initial/Assessment Note    Patient Details  Name: Natasha Lawson MRN: [id number] Date of Birth: [birth year]  Transition of [facility]) CM/SW Contact:    Gelene Mink, [place] Phone Number: 04/03/2019, 4:57 PM  Clinical Narrative:                  CSW met with the patient at bedside. CSW introduced herself and explained her role. CSW shared current plan is for IP rehab. CSW asked the patient if they could coordinate a backup plan just incase if she wasn't able to attend IP rehab. Patient is agreeable to SNF. She would like [facility] or [facility] in [place].   CSW has not faxed the patient out yet. CSW has completed the patient's FL2. Patient will need a PASSR if she goes to an [facility] SNF.   CSW will continue to follow and assist with disposition planning.   Expected Discharge Plan: IP Rehab Facility Barriers to Discharge: Continued Medical Work up   Patient Goals and CMS Choice Patient states their goals for this hospitalization and ongoing recovery are:: Pt wants to feel better CMS Medicare.gov Compare Post Acute Care list provided to:: Patient Choice offered to / list presented to : Patient  Expected Discharge Plan and Services Expected Discharge Plan: [place] In-house Referral: Clinical Social Work   Post Acute Care Choice: IP Rehab Living arrangements for the past 2 months: Single Family Home Expected Discharge Date: 04/03/19                                    Prior Living Arrangements/Services Living arrangements for the past 2 months: Single Family Home Lives with:: Spouse Patient language and need for interpreter reviewed:: No Do you feel safe going back to the place where you live?: Yes      Need for Family Participation in Patient Care: No (Comment) Care giver support system in place?: Yes (comment)   Criminal Activity/Legal Involvement Pertinent to Current Situation/Hospitalization: No - Comment as  needed  Activities of Daily Living Home Assistive Devices/Equipment: CPAP, Blood pressure cuff, Shower chair with back, Scales, Hand-held shower hose, Eyeglasses, Grab bars around toilet, Cane (specify quad or straight), Walker (specify type) ADL Screening (condition at time of admission) Patient's cognitive ability adequate to safely complete daily activities?: Yes 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) Does the patient have difficulty seeing, even when wearing glasses/contacts?: No Does the patient have difficulty concentrating, remembering, or making decisions?: No Patient able to express need for assistance with ADLs?: Yes Does the patient have difficulty dressing or bathing?: No Independently performs ADLs?: Yes (appropriate for developmental age) Does the patient have difficulty walking or climbing stairs?: Yes Weakness of Legs: Both Weakness of Arms/Hands: Both  Permission Sought/Granted Permission sought to share information with : Case Manager Permission granted to share information with : Yes, Verbal Permission Granted     Permission granted to share info w AGENCY: IP rehab or SNF        Emotional Assessment Appearance:: Appears stated age Attitude/Demeanor/Rapport: Engaged Affect (typically observed): Calm Orientation: : Oriented to Self, Oriented to Place, Oriented to  Time, Oriented to Situation Alcohol / Substance Use: Not Applicable Psych Involvement: No (comment)  Admission diagnosis:  Cervical myelopathy ([place]) [G95.9] Weakness [R53.1] Patient Active Problem List   Diagnosis Date Noted  . Cervical myelopathy ([place]) 03/29/2019  PCP:  Moshe Cipro, MD Pharmacy:   [place], [place] [address] Phone: [phone] Fax: [phone]     Social Determinants of Health (SDOH) Interventions    Readmission Risk Interventions No flowsheet data found.

## 2019-04-03 NOTE — NC FL2 (Signed)
[place] LEVEL OF CARE SCREENING TOOL     IDENTIFICATION  Patient Name: Natasha Lawson Birthdate: [birth year] Sex: female Admission Date (Current Location): 04/01/2019  [facility] and [place] Number:  Herbalist and Address:  [facility], [place] [address] [place], [place] [address]      Provider Number: [id number]  Attending Physician Name and Address:  Judith Part, MD  Relative Name and Phone Number:  [place], [place], [phone]    Current Level of Care: Hospital Recommended Level of Care: [place] Prior Approval Number:    Date Approved/Denied:   PASRR Number:    Discharge Plan: SNF    Current Diagnoses: Patient Active Problem List   Diagnosis Date Noted  . Cervical myelopathy ([place]) 03/29/2019    Orientation RESPIRATION BLADDER Height & Weight     Self, Time, Situation, Place  Normal Continent Weight: 249 lb 1.9 oz (113 kg) Height:  5\' 7"  (170.2 cm)  BEHAVIORAL SYMPTOMS/MOOD NEUROLOGICAL BOWEL NUTRITION STATUS      Continent Diet(Regular diet, thin liquids)  AMBULATORY STATUS COMMUNICATION OF NEEDS Skin   Limited Assist   Surgical wounds(closed incision on neck, dressing in place, wearing neck brace)                       Personal Care Assistance Level of Assistance  Bathing, Feeding, Dressing Bathing Assistance: Maximum assistance Feeding assistance: Independent Dressing Assistance: Maximum assistance     Functional Limitations Info  Sight, Hearing, Speech Sight Info: Adequate Hearing Info: Adequate Speech Info: Adequate    SPECIAL CARE FACTORS FREQUENCY  PT (By licensed PT), OT (By licensed OT)     PT Frequency: 5x/wk OT Frequency: 5x/wk            Contractures Contractures Info: Not present    Additional Factors Info  Code Status, Allergies Code Status Info: Full Code Allergies Info: Capoten (Captopril), Iodine, Norvasc (Amlodipine Besylate), Shellfish Allergy,  Terazosin Hcl, Xarelto (Rivaroxaban), Welchol (Colesevelam Hcl), Crestor (Rosuvastatin Calcium), Fenofibrate, Pravachol (Pravastatin Sodium), Spironolactone, Statins           Current Medications (04/03/2019):  This is the current hospital active medication list Current Facility-Administered Medications  Medication Dose Route Frequency Provider Last Rate Last Admin  . acetaminophen (TYLENOL) tablet 650 mg  650 mg Oral Q6H PRN Earnie Larsson, MD   650 mg at 04/02/19 1104   Or  . acetaminophen (TYLENOL) suppository 650 mg  650 mg Rectal Q6H PRN Earnie Larsson, MD      . allopurinol (ZYLOPRIM) tablet 300 mg  300 mg Oral Daily Earnie Larsson, MD   300 mg at 04/03/19 [id number]  . cyclobenzaprine (FLEXERIL) tablet 10 mg  10 mg Oral TID PRN Earnie Larsson, MD      . dexamethasone (DECADRON) injection 10 mg  10 mg Intravenous Q6H Pool, Mallie Mussel, MD   10 mg at 04/03/19 1128  . nebivolol (BYSTOLIC) tablet 10 mg  10 mg Oral Daily Earnie Larsson, MD   10 mg at 04/03/19 0914  . ondansetron (ZOFRAN) tablet 4 mg  4 mg Oral Q6H PRN Earnie Larsson, MD       Or  . ondansetron [facility]) injection 4 mg  4 mg Intravenous Q6H PRN Earnie Larsson, MD      . oxyCODONE (Oxy IR/ROXICODONE) immediate release tablet 5 mg  5 mg Oral Q4H PRN Earnie Larsson, MD   5 mg at 04/02/19 1159  . pantoprazole (PROTONIX) EC tablet  40 mg  40 mg Oral Daily Earnie Larsson, MD   40 mg at 04/03/19 [id number]  . potassium chloride (KLOR-CON) CR tablet 10 mEq  10 mEq Oral BID Earnie Larsson, MD   10 mEq at 04/03/19 0903  . senna-docusate (Senokot-S) tablet 1 tablet  1 tablet Oral QHS PRN Earnie Larsson, MD   1 tablet at 04/03/19 0914  . sertraline (ZOLOFT) tablet 100 mg  100 mg Oral Daily Earnie Larsson, MD   100 mg at 04/03/19 [id number]  . torsemide (DEMADEX) tablet 20 mg  20 mg Oral Daily Earnie Larsson, MD   20 mg at 04/03/19 [id number]  . Vitamin D (Ergocalciferol) (DRISDOL) capsule 50,000 Units  50,000 Units Oral Weekly Earnie Larsson, MD   50,000 Units at 04/02/19 0840     Discharge  Medications: Please see discharge summary for a list of discharge medications.  Relevant Imaging Results:  Relevant Lab Results:   Additional Information SSN: [id number]; COVID negative on 04/02/2019  Rickita Forstner B Doneisha Ivey, LCSWA

## 2019-04-04 ENCOUNTER — Other Ambulatory Visit: Payer: Self-pay

## 2019-04-04 ENCOUNTER — Encounter (HOSPITAL_COMMUNITY): Payer: Self-pay | Admitting: Physical Medicine and Rehabilitation

## 2019-04-04 ENCOUNTER — Inpatient Hospital Stay (HOSPITAL_COMMUNITY)
Admission: AD | Admit: 2019-04-04 | Discharge: 2019-04-21 | DRG: 559 | Disposition: A | Discharge status: Home Health | Payer: Medicare Other | Source: Intra-hospital | Attending: Physical Medicine and Rehabilitation | Admitting: Physical Medicine and Rehabilitation

## 2019-04-04 ENCOUNTER — Encounter (HOSPITAL_COMMUNITY): Payer: Self-pay | Admitting: Neurological Surgery

## 2019-04-04 DIAGNOSIS — Z8659 Personal history of other mental and behavioral disorders: Secondary | ICD-10-CM

## 2019-04-04 DIAGNOSIS — H9191 Unspecified hearing loss, right ear: Secondary | ICD-10-CM | POA: Diagnosis present

## 2019-04-04 DIAGNOSIS — Z8505 Personal history of malignant neoplasm of liver: Secondary | ICD-10-CM | POA: Diagnosis not present

## 2019-04-04 DIAGNOSIS — Z4789 Encounter for other orthopedic aftercare: Principal | ICD-10-CM

## 2019-04-04 DIAGNOSIS — E1165 Type 2 diabetes mellitus with hyperglycemia: Secondary | ICD-10-CM | POA: Diagnosis present

## 2019-04-04 DIAGNOSIS — E876 Hypokalemia: Secondary | ICD-10-CM | POA: Diagnosis present

## 2019-04-04 DIAGNOSIS — I480 Paroxysmal atrial fibrillation: Secondary | ICD-10-CM | POA: Diagnosis present

## 2019-04-04 DIAGNOSIS — Z888 Allergy status to other drugs, medicaments and biological substances status: Secondary | ICD-10-CM | POA: Diagnosis not present

## 2019-04-04 DIAGNOSIS — Z981 Arthrodesis status: Secondary | ICD-10-CM

## 2019-04-04 DIAGNOSIS — Z7901 Long term (current) use of anticoagulants: Secondary | ICD-10-CM

## 2019-04-04 DIAGNOSIS — D509 Iron deficiency anemia, unspecified: Secondary | ICD-10-CM | POA: Diagnosis present

## 2019-04-04 DIAGNOSIS — Z853 Personal history of malignant neoplasm of breast: Secondary | ICD-10-CM | POA: Diagnosis not present

## 2019-04-04 DIAGNOSIS — D696 Thrombocytopenia, unspecified: Secondary | ICD-10-CM | POA: Diagnosis present

## 2019-04-04 DIAGNOSIS — N3941 Urge incontinence: Secondary | ICD-10-CM | POA: Diagnosis not present

## 2019-04-04 DIAGNOSIS — G959 Disease of spinal cord, unspecified: Principal | ICD-10-CM

## 2019-04-04 DIAGNOSIS — Z809 Family history of malignant neoplasm, unspecified: Secondary | ICD-10-CM

## 2019-04-04 DIAGNOSIS — I10 Essential (primary) hypertension: Secondary | ICD-10-CM | POA: Diagnosis present

## 2019-04-04 DIAGNOSIS — G9763 Postprocedural seroma of a nervous system organ or structure following a nervous system procedure: Secondary | ICD-10-CM | POA: Diagnosis not present

## 2019-04-04 DIAGNOSIS — Z91013 Allergy to seafood: Secondary | ICD-10-CM

## 2019-04-04 DIAGNOSIS — M4712 Other spondylosis with myelopathy, cervical region: Secondary | ICD-10-CM | POA: Diagnosis present

## 2019-04-04 DIAGNOSIS — R791 Abnormal coagulation profile: Secondary | ICD-10-CM

## 2019-04-04 DIAGNOSIS — G4733 Obstructive sleep apnea (adult) (pediatric): Secondary | ICD-10-CM | POA: Diagnosis present

## 2019-04-04 DIAGNOSIS — I4891 Unspecified atrial fibrillation: Secondary | ICD-10-CM | POA: Diagnosis present

## 2019-04-04 DIAGNOSIS — K219 Gastro-esophageal reflux disease without esophagitis: Secondary | ICD-10-CM | POA: Diagnosis present

## 2019-04-04 DIAGNOSIS — R296 Repeated falls: Secondary | ICD-10-CM | POA: Diagnosis present

## 2019-04-04 DIAGNOSIS — Z6838 Body mass index (BMI) 38.0-38.9, adult: Secondary | ICD-10-CM

## 2019-04-04 DIAGNOSIS — Z8249 Family history of ischemic heart disease and other diseases of the circulatory system: Secondary | ICD-10-CM | POA: Diagnosis not present

## 2019-04-04 DIAGNOSIS — T380X5A Adverse effect of glucocorticoids and synthetic analogues, initial encounter: Secondary | ICD-10-CM | POA: Insufficient documentation

## 2019-04-04 DIAGNOSIS — G8918 Other acute postprocedural pain: Secondary | ICD-10-CM

## 2019-04-04 DIAGNOSIS — G8250 Quadriplegia, unspecified: Secondary | ICD-10-CM | POA: Diagnosis present

## 2019-04-04 DIAGNOSIS — K5900 Constipation, unspecified: Secondary | ICD-10-CM | POA: Diagnosis not present

## 2019-04-04 DIAGNOSIS — R739 Hyperglycemia, unspecified: Secondary | ICD-10-CM | POA: Insufficient documentation

## 2019-04-04 LAB — GLUCOSE, CAPILLARY
Glucose-Capillary: 167 mg/dL — ABNORMAL HIGH (ref 70–99)
Glucose-Capillary: 155 mg/dL — ABNORMAL HIGH (ref 70–99)

## 2019-04-04 MED ORDER — TORSEMIDE 20 MG PO TABS
20.0000 mg | ORAL_TABLET | Freq: Every day | ORAL | Status: DC
  Administered 2019-04-05 – 2019-04-18 (×14): 20 mg via ORAL
  Filled 2019-04-04 (×14): qty 1

## 2019-04-04 MED ORDER — WARFARIN SODIUM 3 MG PO TABS: 3.0000 mg | ORAL_TABLET | ORAL | Status: DC

## 2019-04-04 MED ORDER — NEBIVOLOL HCL 10 MG PO TABS
10.0000 mg | ORAL_TABLET | Freq: Every day | ORAL | Status: DC
  Administered 2019-04-05 – 2019-04-21 (×17): 10 mg via ORAL
  Filled 2019-04-04 (×17): qty 1

## 2019-04-04 MED ORDER — TRAZODONE HCL 50 MG PO TABS: 25.0000 mg | ORAL_TABLET | Freq: Every evening | ORAL | Status: DC | PRN

## 2019-04-04 MED ORDER — SENNOSIDES-DOCUSATE SODIUM 8.6-50 MG PO TABS
1.0000 | ORAL_TABLET | Freq: Every day | ORAL | Status: DC
  Administered 2019-04-05 – 2019-04-20 (×16): 1 via ORAL
  Filled 2019-04-04 (×16): qty 1

## 2019-04-04 MED ORDER — CYCLOBENZAPRINE HCL 10 MG PO TABS: 10.0000 mg | ORAL_TABLET | Freq: Three times a day (TID) | ORAL | Status: DC | PRN

## 2019-04-04 MED ORDER — VITAMIN D (ERGOCALCIFEROL) 1.25 MG (50000 UNIT) PO CAPS
50000.0000 [IU] | ORAL_CAPSULE | ORAL | Status: DC
  Administered 2019-04-09 – 2019-04-16 (×2): 50000 [IU] via ORAL
  Filled 2019-04-04 (×2): qty 1

## 2019-04-04 MED ORDER — HEPARIN SODIUM (PORCINE) 5000 UNIT/ML IJ SOLN
5000.0000 [IU] | Freq: Three times a day (TID) | INTRAMUSCULAR | Status: DC
  Administered 2019-04-04 – 2019-04-12 (×22): 5000 [IU] via SUBCUTANEOUS
  Filled 2019-04-04 (×23): qty 1

## 2019-04-04 MED ORDER — PROCHLORPERAZINE MALEATE 5 MG PO TABS: 5.0000 mg | ORAL_TABLET | Freq: Four times a day (QID) | ORAL | Status: DC | PRN

## 2019-04-04 MED ORDER — DIPHENHYDRAMINE HCL 12.5 MG/5ML PO ELIX: 12.5000 mg | ORAL_SOLUTION | Freq: Four times a day (QID) | ORAL | Status: DC | PRN

## 2019-04-04 MED ORDER — GUAIFENESIN-DM 100-10 MG/5ML PO SYRP: 5.0000 mL | ORAL_SOLUTION | Freq: Four times a day (QID) | ORAL | Status: DC | PRN

## 2019-04-04 MED ORDER — FLEET ENEMA 7-19 GM/118ML RE ENEM: 1.0000 | ENEMA | Freq: Once | RECTAL | Status: DC | PRN

## 2019-04-04 MED ORDER — POLYSACCHARIDE IRON COMPLEX 150 MG PO CAPS
150.0000 mg | ORAL_CAPSULE | Freq: Two times a day (BID) | ORAL | Status: DC
  Administered 2019-04-04 – 2019-04-21 (×34): 150 mg via ORAL
  Filled 2019-04-04 (×34): qty 1

## 2019-04-04 MED ORDER — BISACODYL 10 MG RE SUPP
10.0000 mg | Freq: Every day | RECTAL | Status: DC | PRN
  Filled 2019-04-04: qty 1

## 2019-04-04 MED ORDER — DEXAMETHASONE SODIUM PHOSPHATE 10 MG/ML IJ SOLN
10.0000 mg | Freq: Four times a day (QID) | INTRAMUSCULAR | Status: DC
  Administered 2019-04-04 – 2019-04-05 (×4): 10 mg via INTRAVENOUS
  Filled 2019-04-04 (×5): qty 1

## 2019-04-04 MED ORDER — PRO-STAT SUGAR FREE PO LIQD
30.0000 mL | Freq: Two times a day (BID) | ORAL | Status: DC
  Administered 2019-04-04 – 2019-04-21 (×34): 30 mL via ORAL
  Filled 2019-04-04 (×34): qty 30

## 2019-04-04 MED ORDER — INSULIN ASPART 100 UNIT/ML ~~LOC~~ SOLN
0.0000 [IU] | Freq: Every day | SUBCUTANEOUS | Status: DC
  Administered 2019-04-07 – 2019-04-14 (×4): 2 [IU] via SUBCUTANEOUS
  Administered 2019-04-15: 21:00:00 3 [IU] via SUBCUTANEOUS
  Administered 2019-04-16: 2 [IU] via SUBCUTANEOUS
  Administered 2019-04-17: 22:00:00 3 [IU] via SUBCUTANEOUS

## 2019-04-04 MED ORDER — PROCHLORPERAZINE 25 MG RE SUPP: 12.5000 mg | Freq: Four times a day (QID) | RECTAL | Status: DC | PRN

## 2019-04-04 MED ORDER — INSULIN ASPART 100 UNIT/ML ~~LOC~~ SOLN
0.0000 [IU] | Freq: Three times a day (TID) | SUBCUTANEOUS | Status: DC
  Administered 2019-04-04 – 2019-04-05 (×3): 2 [IU] via SUBCUTANEOUS
  Administered 2019-04-06 – 2019-04-07 (×3): 1 [IU] via SUBCUTANEOUS
  Administered 2019-04-08: 2 [IU] via SUBCUTANEOUS
  Administered 2019-04-08: 1 [IU] via SUBCUTANEOUS
  Administered 2019-04-08: 2 [IU] via SUBCUTANEOUS
  Administered 2019-04-09 – 2019-04-10 (×5): 1 [IU] via SUBCUTANEOUS
  Administered 2019-04-10: 2 [IU] via SUBCUTANEOUS
  Administered 2019-04-11: 1 [IU] via SUBCUTANEOUS
  Administered 2019-04-12: 2 [IU] via SUBCUTANEOUS
  Administered 2019-04-12 – 2019-04-14 (×5): 1 [IU] via SUBCUTANEOUS
  Administered 2019-04-14 – 2019-04-15 (×3): 2 [IU] via SUBCUTANEOUS
  Administered 2019-04-15: 07:00:00 1 [IU] via SUBCUTANEOUS
  Administered 2019-04-15 – 2019-04-16 (×3): 2 [IU] via SUBCUTANEOUS
  Administered 2019-04-17: 3 [IU] via SUBCUTANEOUS
  Administered 2019-04-17: 07:00:00 2 [IU] via SUBCUTANEOUS
  Administered 2019-04-18: 1 [IU] via SUBCUTANEOUS
  Administered 2019-04-18: 2 [IU] via SUBCUTANEOUS
  Administered 2019-04-18 – 2019-04-19 (×4): 1 [IU] via SUBCUTANEOUS
  Administered 2019-04-20: 3 [IU] via SUBCUTANEOUS
  Administered 2019-04-20 – 2019-04-21 (×3): 1 [IU] via SUBCUTANEOUS

## 2019-04-04 MED ORDER — SERTRALINE HCL 100 MG PO TABS
100.0000 mg | ORAL_TABLET | Freq: Every day | ORAL | Status: DC
  Administered 2019-04-05 – 2019-04-21 (×17): 100 mg via ORAL
  Filled 2019-04-04 (×17): qty 1

## 2019-04-04 MED ORDER — POTASSIUM CHLORIDE CRYS ER 10 MEQ PO TBCR
10.0000 meq | EXTENDED_RELEASE_TABLET | Freq: Two times a day (BID) | ORAL | Status: DC
  Administered 2019-04-04 – 2019-04-07 (×7): 10 meq via ORAL
  Filled 2019-04-04 (×8): qty 1

## 2019-04-04 MED ORDER — POTASSIUM CHLORIDE CRYS ER 20 MEQ PO TBCR: 40.0000 meq | EXTENDED_RELEASE_TABLET | Freq: Two times a day (BID) | ORAL | Status: DC

## 2019-04-04 MED ORDER — ALLOPURINOL 300 MG PO TABS
300.0000 mg | ORAL_TABLET | Freq: Every day | ORAL | Status: DC
  Administered 2019-04-05 – 2019-04-21 (×17): 300 mg via ORAL
  Filled 2019-04-04 (×17): qty 1

## 2019-04-04 MED ORDER — ALUM & MAG HYDROXIDE-SIMETH 200-200-20 MG/5ML PO SUSP: 30.0000 mL | ORAL | Status: DC | PRN

## 2019-04-04 MED ORDER — HEPARIN SODIUM (PORCINE) 5000 UNIT/ML IJ SOLN: 5000.0000 [IU] | Freq: Three times a day (TID) | INTRAMUSCULAR | Status: DC

## 2019-04-04 MED ORDER — ACETAMINOPHEN 325 MG PO TABS
325.0000 mg | ORAL_TABLET | ORAL | Status: DC | PRN
  Administered 2019-04-14 – 2019-04-16 (×4): 650 mg via ORAL
  Administered 2019-04-17: 21:00:00 325 mg via ORAL
  Administered 2019-04-17 – 2019-04-19 (×2): 650 mg via ORAL
  Filled 2019-04-04 (×7): qty 2

## 2019-04-04 MED ORDER — WARFARIN SODIUM 2 MG PO TABS: 2.0000 mg | ORAL_TABLET | ORAL | Status: DC

## 2019-04-04 MED ORDER — PANTOPRAZOLE SODIUM 40 MG PO TBEC
40.0000 mg | DELAYED_RELEASE_TABLET | Freq: Every day | ORAL | Status: DC
  Administered 2019-04-05 – 2019-04-21 (×17): 40 mg via ORAL
  Filled 2019-04-04 (×17): qty 1

## 2019-04-04 MED ORDER — PROCHLORPERAZINE EDISYLATE 10 MG/2ML IJ SOLN: 5.0000 mg | Freq: Four times a day (QID) | INTRAMUSCULAR | Status: DC | PRN

## 2019-04-04 MED ORDER — POLYETHYLENE GLYCOL 3350 17 G PO PACK
17.0000 g | PACK | Freq: Every day | ORAL | Status: DC | PRN
  Administered 2019-04-06: 17 g via ORAL
  Filled 2019-04-04: qty 1

## 2019-04-04 MED ORDER — OXYCODONE HCL 5 MG PO TABS
5.0000 mg | ORAL_TABLET | ORAL | Status: DC | PRN
  Administered 2019-04-05 – 2019-04-09 (×4): 5 mg via ORAL
  Filled 2019-04-04 (×4): qty 1

## 2019-04-04 NOTE — H&P (Signed)
Physical Medicine and Rehabilitation Admission H&P    Chief Complaint  Patient presents with  . Cervical myelopathy.     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 She was discharged to home on 12/16 but has had problems managing with recurrent falls and difficulty walking. She was admitted on 12/18 with MRI Cervical spine revealing post op changes with small subdural and/or intradural collection extending form C5-C7 with mass effect on dorsal and right aspect with moderate compression of cervical spinal cord without signal changes--fluid collection extends form C7 to upper thoracic spinal cord.  She was started on IV decadron and chronic coumadin to be held thorough POD# 10. Therapy evaluations done revealing functional deficits and CIR recommended due to myelopathy.    Review of Systems  Constitutional: Negative for chills and fever.  HENT: Negative for hearing loss and tinnitus.   Eyes: Positive for blurred vision (in the right).  Respiratory: Negative for cough and shortness of breath.   Cardiovascular: Positive for leg swelling (worse in the past 4-5 months. ). Negative for chest pain and palpitations.  Gastrointestinal: Negative for blood in stool, heartburn and nausea.  Genitourinary: Negative for dysuria and urgency.  Musculoskeletal: Positive for falls (numerous for years--usually "face plants") and myalgias.  Skin: Negative for itching and rash.  Neurological: Positive for sensory change (has band like feeling around the ankles at times. Numbness left sided and now right 3rd/4th finger and spotty places on right foot. ) and focal weakness (left sided since 2019). Negative for  dizziness and headaches.  Psychiatric/Behavioral: Negative for memory loss. The patient is not nervous/anxious.      Past Medical History:  Diagnosis Date  . Anxiety   . Cancer [facility])    breast cancer right  . Dysrhythmia    A-Fib on coumadin  . GERD (gastroesophageal reflux disease)   . Gout   . 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 History of blood transfusion   . Hypertension   . Sleep apnea    uses CPAP nightly    Past Surgical History:  Procedure Laterality Date  . ABDOMINAL HYSTERECTOMY    . BREAST SURGERY Right 08/14/2014   lumpectomy/lynph nodes  . BREAST SURGERY Right 10/05/2016   mass removed  . CARDIAC CATHETERIZATION    . CARPAL TUNNEL RELEASE Bilateral 10/01/11, 12/02/11  . CHOLECYSTECTOMY  1987  . COLONOSCOPY    . DILATION AND CURETTAGE OF UTERUS  10/10/2008   hysteroscopy with biopsy  . EYE SURGERY Bilateral    removed cataracts  . HYSTEROSCOPY WITH D & C  08/19/2011  . INCONTINENCE SURGERY  01/11/2007  . JOINT REPLACEMENT Right 10/18/2013  . laparoscopy bilateral salpingo-oophorectomy  01/27/2012  . NECK SURGERY  11/16/2017  . pci lad  01/02/2015   stent   . POSTERIOR CERVICAL FUSION/FORAMINOTOMY N/A 03/29/2019   Procedure: Cervical Five to Cervical Seven Posterior cervical laminectomy and instrumented fusion;  Surgeon: Judith Part, MD;  Location: [facility];  Service: Neurosurgery;  Laterality: N/A;  Cervical Five to Cervical Seven Posterior cervical laminectomy and instrumented fusion  . radiation breast canceer Right    from 12/08/14-01/12/15  . rotator cuff  Right 04/26/2010  repair  . TONSILLECTOMY  1954  . TUBAL LIGATION  1984  . UPPER GI ENDOSCOPY  2016    Family History  Problem Relation Age of Onset  . Heart disease Mother   . Cancer - Lung Father   . Cancer Sister      Social History:  Married. 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  Husband has been managing home/cooking for the past 10 years.  She reports that she  has never smoked. She has never used smokeless tobacco. She reports that she does not drink alcohol or use drugs.   Allergies  Allergen Reactions  . Capoten [Captopril] Swelling    Tongue   . Iodine Hives  . Norvasc [Amlodipine Besylate] Swelling  . Shellfish Allergy Hives  . Terazosin Hcl Other (See Comments)    Caused a-fib  . Xarelto [Rivaroxaban] Other (See Comments)    Severe back pain  . Welchol [Colesevelam Hcl] Other (See Comments)    Confusion   . Crestor [Rosuvastatin Calcium] Other (See Comments)    Muscle aches  . Fenofibrate Other (See Comments)    Muscle aches  . Pravachol [Pravastatin Sodium] Other (See Comments)    fatigue  . Spironolactone Diarrhea  . Statins Other (See Comments)    Joint pain    Medications Prior to Admission  Medication Sig Dispense Refill  . allopurinol (ZYLOPRIM) 300 MG tablet Take 300 mg by mouth daily with breakfast.     . cyclobenzaprine (FLEXERIL) 10 MG tablet Take 1 tablet (10 mg total) by mouth 3 (three) times daily as needed for muscle spasms. 30 tablet 0  . Nebivolol HCl (BYSTOLIC) 20 MG TABS Take 20 mg by mouth daily with breakfast.    . omeprazole (PRILOSEC) 20 MG capsule Take 20 mg by mouth daily before breakfast.    . oxyCODONE (OXY IR/ROXICODONE) 5 MG immediate release tablet Take 1 tablet (5 mg total) by mouth every 4 (four) hours as needed (pain). 30 tablet 0  . potassium chloride (MICRO-K) 10 MEQ CR capsule Take 10 mEq by mouth 2 (two) times daily.    [facility] PRESCRIPTION MEDICATION Inhale into the lungs at bedtime. CPAP    . sertraline (ZOLOFT) 100 MG tablet Take 100 mg by mouth daily with breakfast.     . torsemide (DEMADEX) 20 MG tablet Take 20 mg by mouth daily with breakfast.     . Vitamin D, Ergocalciferol, (DRISDOL) 1.25 MG (50000 UT) CAPS capsule Take 50,000 Units by mouth every Friday.     . warfarin (COUMADIN) 2 MG tablet Take 1 tablet (2 mg total) by mouth See admin instructions. Take 2 mg by mouth on Tuesday,  Wednesday, Thursday, Saturday and Sunday  Restart warfarin on 04/08/19    . warfarin (COUMADIN) 3 MG tablet Take 1 tablet (3 mg total) by mouth See admin instructions. Take 3 mg by mouth on Monday and Friday  Restart warfarin on 04/08/19      Drug Regimen Review  Drug regimen was reviewed and remains appropriate with no significant issues identified  Home: Home Living Family/patient expects to be discharged to:: Private residence Living Arrangements: Spouse/significant other Available Help at Discharge: Family, Available 24 hours/day Type of Home: House Home Access: Stairs to enter [employer] of Steps: 2(half steps per pt) Entrance Stairs-Rails: None Home Layout: One level Bathroom Shower/Tub: Multimedia programmer: Standard Home Equipment: Environmental consultant - 2 wheels, Environmental consultant - 4 wheels, Adaptive equipment Adaptive Equipment: Reacher, Sock aid, Long-handled shoe horn, Long-handled  sponge Additional Comments: pt lives with her husband but he has back issues as well as a torn meniscus   Functional History: Prior Function Level of Independence: Needs assistance Gait / Transfers Assistance Needed: ambulated with rollator, frequent falls ADL's / Homemaking Assistance Needed: husband assisted with ADL's as needed, pt could dress herself  Functional Status:  Mobility: Bed Mobility Overal bed mobility: Needs Assistance Bed Mobility: Rolling, Sidelying to Sit, Sit to Sidelying Rolling: Min assist Sidelying to sit: Mod assist Sit to sidelying: Mod assist General bed mobility comments: patient received in recliner Transfers Overall transfer level: Needs assistance Equipment used: Rolling walker (2 wheeled) Transfers: Sit to/from Stand, Stand Pivot Transfers Sit to Stand: Mod assist Stand pivot transfers: Min assist General transfer comment: modA with BUE support of RW Ambulation/Gait Ambulation/Gait assistance: Min assist Gait Distance (Feet): 3 Feet Assistive  device: Rolling walker (2 wheeled) Gait Pattern/deviations: Step-to pattern, Shuffle General Gait Details: Patient able to take steps to pivot around to [facility] and back to recliner Gait velocity: reduced Gait velocity interpretation: <1.31 ft/sec, indicative of household ambulator  ADL: ADL Overall ADL's : Needs assistance/impaired Eating/Feeding: Set up, Sitting Grooming: Set up, Sitting Upper Body Bathing: Moderate assistance, Sitting, Cueing for safety Lower Body Bathing: Maximal assistance, +2 for physical assistance, +2 for safety/equipment, Cueing for safety, Cueing for sequencing, Sitting/lateral leans, Sit to/from stand Upper Body Dressing : Moderate assistance, Sitting, Cueing for safety Lower Body Dressing: Maximal assistance, +2 for physical assistance, +2 for safety/equipment, Sitting/lateral leans, Sit to/from stand, Cueing for safety Toilet Transfer: Maximal assistance, +2 for physical assistance, +2 for safety/equipment, RW, Stand-pivot, BSC Toilet Transfer Details (indicate cue type and reason): side stepping towards HOB; unsteady for BSC transfer today Toileting- Clothing Manipulation and Hygiene: Maximal assistance, +2 for physical assistance, +2 for safety/equipment, Sitting/lateral lean, Sit to/from stand, Cueing for safety Functional mobility during ADLs: Moderate assistance, Maximal assistance, Rolling walker, +2 for safety/equipment, +2 for physical assistance General ADL Comments: Pt limited by pain in R shoulder decreased ability to care for self with weakness all over and pain.  Cognition: Cognition Overall Cognitive Status: Within Functional Limits for tasks assessed Orientation Level: Oriented X4 Cognition Arousal/Alertness: Awake/alert Behavior During Therapy: WFL for tasks assessed/performed Overall Cognitive Status: Within Functional Limits for tasks assessed.   Blood pressure (!) 162/70, pulse 60, temperature 97.6 F (36.4 C), temperature source Oral,  resp. rate 16, height 5\' 7"  (1.702 m), weight 112.7 kg, SpO2 97 %.  Physical Exam  Nursing note and vitals reviewed. General: Alert and oriented x 3, No apparent distress. Obese, up in chair with soft collar in place HEENT: Head is normocephalic, atraumatic, PERRLA, EOMI, sclera anicteric, oral mucosa pink and moist, dentition intact, ext ear canals clear,  Neck: Supple without JVD or lymphadenopathy Heart: Reg rate and rhythm. No murmurs rubs or gallops Chest: CTA bilaterally without wheezes, rales, or rhonchi; no distress Abdomen: Soft, non-tender, non-distended, bowel sounds positive. Extremities: No clubbing, cyanosis, or edema. Pulses are 2+ Skin: Posterior cervical spine incision with mild erythema but C/D/I  Neuro: She is alert and oriented to person, place, and time.  Has diffuse patchy numbness present in all extremities.  Musculoskeletal: 1+ pedal edema.  Well healed right TKR. 4-/5 strength on LUE and 4/5 strength in other extremities.  Psych: Pt's affect is appropriate. Pt is cooperative   Results for orders placed or performed during the hospital encounter of 04/04/19 (from the past 48 hour(s))  Glucose, capillary     Status: Abnormal  Collection Time: 04/04/19  4:27 PM  Result Value Ref Range   Glucose-Capillary 167 (H) 70 - 99 mg/dL   No results found.  Medical Problem List and Plan: 1.  Impaired mobility and ADLs secondary to cervical myelopathy  -patient may shower, but spinal incision should be covered  -ELOS/Goals: ModI in PT, OT, I in SLP 2.  Antithrombotics: -DVT/anticoagulation:  Pharmaceutical: Heparin  -antiplatelet therapy: ASA  -chronic coumadin is being held until post-op day #10 3. Pain Management:  Oxycodone prn 4. Mood: LCSW to follow for evaluation and support.   -antipsychotic agents: NA 5. Neuropsych: This patient is capable of making decisions on her own behalf. 6. Skin/Wound Care: routine pressure relief measures.  7.  Fluids/Electrolytes/Nutrition: Monitor I/O. Check lytes in am.  8.  Subdural/intradural collection with cord compression: On IV decadron 10 mg every 4 hours 9. Hyperglycemia: has h/o diet controlled DM in the past. Will monitor BS ac/hs as now on steroids.  10.  OSA: continue CPAP at nights.  11. PAF: On Bystolic bid.  Monitor HR TID--continue to hold coumadin thorough  12. H/o iron deficiency anemia: 13. H/o Anxiety disorder: Has been stable on Zoloft.  14. Hypokalemia: Was on Kdur 10 meq bid but has had progressive drop 3.2-->2.9. Will increase to 40 meq bid for today and recheck labs in am. Adjust as needed.  15. Thrombocytopenia: Recheck in am.  16. HTN/Peripheral edema: On Demadex daily  17. Atrial fibrillation: Coumadin will be restarted on post-op day #10.    Reesa Chew, PA-C  I have personally performed a face to face diagnostic evaluation, including, but not limited to relevant history and physical exam findings, of this patient and developed relevant assessment and plan.  Additionally, I have reviewed and concur with the physician assistant's documentation above.  The patient's status has not changed. The original post admission physician evaluation remains appropriate, and any changes from the pre-admission screening or documentation from the acute chart are noted above.   Leeroy Cha, MD

## 2019-04-04 NOTE — H&P (Signed)
Physical Medicine and Rehabilitation Admission H&P    Chief Complaint  Patient presents with  . Cervical myelopathy.     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 She was discharged to home on 12/16 but has had problems managing with recurrent falls and difficulty walking. She was admitted on 12/18 with MRI Cervical spine revealing post op changes with small subdural and/or intradural collection extending form C5-C7 with mass effect on dorsal and right aspect with moderate compression of cervical spinal cord without signal changes--fluid collection extends form C7 to upper thoracic spinal cord.  She was started on IV decadron and chronic coumadin to be held thorough POD# 10. Therapy evaluations done revealing functional deficits and CIR recommended due to myelopathy.    Review of Systems  Constitutional: Negative for chills and fever.  HENT: Negative for hearing loss and tinnitus.   Eyes: Positive for blurred vision (in the right).  Respiratory: Negative for cough and shortness of breath.   Cardiovascular: Positive for leg swelling (worse in the past 4-5 months. ). Negative for chest pain and palpitations.  Gastrointestinal: Negative for blood in stool, heartburn and nausea.  Genitourinary: Negative for dysuria and urgency.  Musculoskeletal: Positive for falls (numerous for years--usually "face plants") and myalgias.  Skin: Negative for itching and rash.  Neurological: Positive for sensory change (has band like feeling around the ankles at times. Numbness left sided and now right 3rd/4th finger and spotty places on right foot. ) and focal weakness (left sided since 2019). Negative for  dizziness and headaches.  Psychiatric/Behavioral: Negative for memory loss. The patient is not nervous/anxious.      Past Medical History:  Diagnosis Date  . Anxiety   . Cancer [facility])    breast cancer right  . Dysrhythmia    A-Fib on coumadin  . GERD (gastroesophageal reflux disease)   . Gout   . 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 History of blood transfusion   . Hypertension   . Sleep apnea    uses CPAP nightly    Past Surgical History:  Procedure Laterality Date  . ABDOMINAL HYSTERECTOMY    . BREAST SURGERY Right 08/14/2014   lumpectomy/lynph nodes  . BREAST SURGERY Right 10/05/2016   mass removed  . CARDIAC CATHETERIZATION    . CARPAL TUNNEL RELEASE Bilateral 10/01/11, 12/02/11  . CHOLECYSTECTOMY  1987  . COLONOSCOPY    . DILATION AND CURETTAGE OF UTERUS  10/10/2008   hysteroscopy with biopsy  . EYE SURGERY Bilateral    removed cataracts  . HYSTEROSCOPY WITH D & C  08/19/2011  . INCONTINENCE SURGERY  01/11/2007  . JOINT REPLACEMENT Right 10/18/2013  . laparoscopy bilateral salpingo-oophorectomy  01/27/2012  . NECK SURGERY  11/16/2017  . pci lad  01/02/2015   stent   . POSTERIOR CERVICAL FUSION/FORAMINOTOMY N/A 03/29/2019   Procedure: Cervical Five to Cervical Seven Posterior cervical laminectomy and instrumented fusion;  Surgeon: Judith Part, MD;  Location: [place];  Service: Neurosurgery;  Laterality: N/A;  Cervical Five to Cervical Seven Posterior cervical laminectomy and instrumented fusion  . radiation breast canceer Right    from 12/08/14-01/12/15  . rotator cuff  Right 04/26/2010  repair  . TONSILLECTOMY  1954  . TUBAL LIGATION  1984  . UPPER GI ENDOSCOPY  2016    Family History  Problem Relation Age of Onset  . Heart disease Mother   . Cancer - Lung Father   . Cancer Sister      Social History:  Married. 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  Husband has been managing home/cooking for the past 10 years.  She reports that she  has never smoked. She has never used smokeless tobacco. She reports that she does not drink alcohol or use drugs.   Allergies  Allergen Reactions  . Capoten [Captopril] Swelling    Tongue   . Iodine Hives  . Norvasc [Amlodipine Besylate] Swelling  . Shellfish Allergy Hives  . Terazosin Hcl Other (See Comments)    Caused a-fib  . Xarelto [Rivaroxaban] Other (See Comments)    Severe back pain  . Welchol [Colesevelam Hcl] Other (See Comments)    Confusion   . Crestor [Rosuvastatin Calcium] Other (See Comments)    Muscle aches  . Fenofibrate Other (See Comments)    Muscle aches  . Pravachol [Pravastatin Sodium] Other (See Comments)    fatigue  . Spironolactone Diarrhea  . Statins Other (See Comments)    Joint pain    Medications Prior to Admission  Medication Sig Dispense Refill  . allopurinol (ZYLOPRIM) 300 MG tablet Take 300 mg by mouth daily with breakfast.     . cyclobenzaprine (FLEXERIL) 10 MG tablet Take 1 tablet (10 mg total) by mouth 3 (three) times daily as needed for muscle spasms. 30 tablet 0  . Nebivolol HCl (BYSTOLIC) 20 MG TABS Take 20 mg by mouth daily with breakfast.    . omeprazole (PRILOSEC) 20 MG capsule Take 20 mg by mouth daily before breakfast.    . oxyCODONE (OXY IR/ROXICODONE) 5 MG immediate release tablet Take 1 tablet (5 mg total) by mouth every 4 (four) hours as needed (pain). 30 tablet 0  . potassium chloride (MICRO-K) 10 MEQ CR capsule Take 10 mEq by mouth 2 (two) times daily.    . sertraline (ZOLOFT) 100 MG tablet Take 100 mg by mouth daily with breakfast.     . torsemide (DEMADEX) 20 MG tablet Take 20 mg by mouth daily with breakfast.     . Vitamin D, Ergocalciferol, (DRISDOL) 1.25 MG (50000 UT) CAPS capsule Take 50,000 Units by mouth every Friday.     Marland Kitchen PRESCRIPTION MEDICATION Inhale into the lungs at bedtime. CPAP    . [DISCONTINUED] warfarin (COUMADIN) 2 MG tablet Take 1 tablet (2 mg total) by mouth See admin instructions. Take 2 mg by mouth on  Tuesday, Wednesday, Thursday, Saturday and Sunday  Restart warfarin on 04/03/19 (Patient taking differently: Take 2 mg by mouth at bedtime. )    . [DISCONTINUED] warfarin (COUMADIN) 3 MG tablet Take 1 tablet (3 mg total) by mouth See admin instructions. Take 3 mg by mouth on Monday and Friday  Restart warfarin on 04/03/19 (Patient not taking: Reported on 04/01/2019)      Drug Regimen Review  Drug regimen was reviewed and remains appropriate with no significant issues identified  Home: Home Living Family/patient expects to be discharged to:: Private residence Living Arrangements: Spouse/significant other Available Help at Discharge: Family, Available 24 hours/day Type of Home: House Home Access: Stairs to enter [employer] of Steps: 2(half steps per pt) Entrance Stairs-Rails: None Home Layout: One level Bathroom Shower/Tub: Multimedia programmer: Standard Home Equipment:  Walker - 2 wheels, Walker - 4 wheels, Financial controller: Reacher, Sock aid, Long-handled shoe horn, Long-handled sponge Additional Comments: pt lives with her husband but he has back issues as well as a torn meniscus   Functional History: Prior Function Level of Independence: Needs assistance Gait / Transfers Assistance Needed: ambulated with rollator, frequent falls ADL's / Homemaking Assistance Needed: husband assisted with ADL's as needed, pt could dress herself  Functional Status:  Mobility: Bed Mobility Overal bed mobility: Needs Assistance Bed Mobility: Rolling, Sidelying to Sit, Sit to Sidelying Rolling: Min assist Sidelying to sit: Mod assist Sit to sidelying: Mod assist General bed mobility comments: patient received in recliner Transfers Overall transfer level: Needs assistance Equipment used: Rolling walker (2 wheeled) Transfers: Sit to/from Stand, Stand Pivot Transfers Sit to Stand: Mod assist Stand pivot transfers: Min assist General transfer comment:  modA with BUE support of RW Ambulation/Gait Ambulation/Gait assistance: Min assist Gait Distance (Feet): 3 Feet Assistive device: Rolling walker (2 wheeled) Gait Pattern/deviations: Step-to pattern, Shuffle General Gait Details: Patient able to take steps to pivot around to [facility] and back to recliner Gait velocity: reduced Gait velocity interpretation: <1.31 ft/sec, indicative of household ambulator    ADL: ADL Overall ADL's : Needs assistance/impaired Eating/Feeding: Set up, Sitting Grooming: Set up, Sitting Upper Body Bathing: Moderate assistance, Sitting, Cueing for safety Lower Body Bathing: Maximal assistance, +2 for physical assistance, +2 for safety/equipment, Cueing for safety, Cueing for sequencing, Sitting/lateral leans, Sit to/from stand Upper Body Dressing : Moderate assistance, Sitting, Cueing for safety Lower Body Dressing: Maximal assistance, +2 for physical assistance, +2 for safety/equipment, Sitting/lateral leans, Sit to/from stand, Cueing for safety Toilet Transfer: Maximal assistance, +2 for physical assistance, +2 for safety/equipment, RW, Stand-pivot, BSC Toilet Transfer Details (indicate cue type and reason): side stepping towards HOB; unsteady for BSC transfer today Toileting- Clothing Manipulation and Hygiene: Maximal assistance, +2 for physical assistance, +2 for safety/equipment, Sitting/lateral lean, Sit to/from stand, Cueing for safety Functional mobility during ADLs: Moderate assistance, Maximal assistance, Rolling walker, +2 for safety/equipment, +2 for physical assistance General ADL Comments: Pt limited by pain in R shoulder decreased ability to care for self with weakness all over and pain.  Cognition: Cognition Overall Cognitive Status: Within Functional Limits for tasks assessed Orientation Level: Oriented X4 Cognition Arousal/Alertness: Awake/alert Behavior During Therapy: WFL for tasks assessed/performed Overall Cognitive Status: Within Functional  Limits for tasks assessed   Blood pressure (!) 159/72, pulse (!) 59, temperature 97.6 F (36.4 C), temperature source Oral, resp. rate 18, height 5\' 7"  (1.702 m), weight 113 kg, SpO2 99 %.  Physical Exam  Nursing note and vitals reviewed. General: Alert and oriented x 3, No apparent distress. Obese, up in chair with soft collar in place HEENT: Head is normocephalic, atraumatic, PERRLA, EOMI, sclera anicteric, oral mucosa pink and moist, dentition intact, ext ear canals clear,  Neck: Supple without JVD or lymphadenopathy Heart: Reg rate and rhythm. No murmurs rubs or gallops Chest: CTA bilaterally without wheezes, rales, or rhonchi; no distress Abdomen: Soft, non-tender, non-distended, bowel sounds positive. Extremities: No clubbing, cyanosis, or edema. Pulses are 2+ Skin: Posterior cervical spine incision with mild erythema but C/D/I  Neuro: She is alert and oriented to person, place, and time.  Has diffuse patchy numbness present in all extremities.  Musculoskeletal: 1+ pedal edema.  Well healed right TKR. 4-/5 strength on LUE and 4/5 strength in other extremities.  Psych: Pt's affect is appropriate. Pt is cooperative   No results found for  this or any previous visit (from the past 48 hour(s)). No results found.  Medical Problem List and Plan: 1.  Impaired mobility and ADLs secondary to cervical myelopathy  -patient may shower, but spinal incision should be covered  -ELOS/Goals: ModI in PT, OT, I in SLP 2.  Antithrombotics: -DVT/anticoagulation:  Pharmaceutical: Heparin  -antiplatelet therapy: ASA  -chronic coumadin is being held until post-op day #10 3. Pain Management:  Oxycodone prn 4. Mood: LCSW to follow for evaluation and support.   -antipsychotic agents: NA 5. Neuropsych: This patient is capable of making decisions on her own behalf. 6. Skin/Wound Care: routine pressure relief measures.  7. Fluids/Electrolytes/Nutrition: Monitor I/O. Check lytes in am.  8.   Subdural/intradural collection with cord compression: On IV decadron 10 mg every 4 hours 9. Hyperglycemia: has h/o diet controlled DM in the past. Will monitor BS ac/hs as now on steroids.  10.  OSA: continue CPAP at nights.  11. PAF: On Bystolic bid.  Monitor HR TID--continue to hold coumadin thorough  12. H/o iron deficiency anemia: 13. H/o Anxiety disorder: Has been stable on Zoloft.  14. Hypokalemia: Was on Kdur 10 meq bid but has had progressive drop 3.2-->2.9. Will increase to 40 meq bid for today and recheck labs in am. Adjust as needed.  15. Thrombocytopenia: Recheck in am.  16. HTN/Peripheral edema: On Demadex daily  17. Atrial fibrillation: Coumadin will be restarted on post-op day #10.    Bary Leriche, PA-C 04/04/2019   I have personally performed a face to face diagnostic evaluation, including, but not limited to relevant history and physical exam findings, of this patient and developed relevant assessment and plan.  Additionally, I have reviewed and concur with the physician assistant's documentation above.  Leeroy Cha, MD

## 2019-04-04 NOTE — PMR Pre-admission (Signed)
PMR Admission Coordinator Pre-Admission Assessment  Patient: Natasha Lawson is an 78 y.o., female MRN: [id number] DOB: Jul 05, 1940 Height: _0  (170.2 cm) Weight: 113 kg  Insurance Information HMO:     PPO:      PCP:      IPA:      80/20:      OTHER:  PRIMARY: Medicare a and b      Policy#: [id number]      Subscriber: pt Benefits:  Phone #: passport one online     Name: 12/21 Eff. Date: 03/14/2008     Deduct: $1408      Out of Pocket Max: none      Life Max: none CIR: 100%      SNF: 20 full days Outpatient: 80%     Co-Pay: 20% Home Health: 100%      Co-Pay: none DME: 80%     Co-Pay: 20% Providers: pt choice  SECONDARY: MEDICO      Policy#: [id number]      Subscriber: pt  Medicaid Application Date:       Case Manager:  Disability Application Date:       Case Worker:   The "Data Collection Information Summary" for patients in Inpatient Rehabilitation Facilities with attached "Privacy Act [place] Records" was provided and verbally reviewed with: Patient  Emergency Contact Information Contact Information    Name Relation Home Work Mobile   Guajardo,JOHN Spouse   [phone]   journei, thomassen Daughter   [phone]      Current Medical History  Patient Admitting Diagnosis: cervical myelopathy  History of Present Illness:   78 year old female with history of breast cancer, gut, HTN, OSA,  PAF (needed admission), attempts at ACDF (residual left sided weakness and neuropathy symptoms) in the past, gait disorder with falls who 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 She was discharged to home on 12/17 but has had problems managing with recurrent falls and difficulty walking. She was admitted on 12/18 with MRI Cervical spine revealing post op changes with small subdural and/or intradural collection extending form C5-C7 with mass effect on dorsal and right aspect with moderate compression of cervical  spinal cord without signal changes--fluid collection extends form C7 to upper thoracic spinal cord.  She was started on IV decadron and chronic coumadin to be held thorough POD# 10.   Patient's medical record from [facility]  has been reviewed by the rehabilitation admission coordinator and physician.  Past Medical History  Past Medical History:  Diagnosis Date  . Anxiety   . Cancer [facility])    breast cancer right  . Dysrhythmia    A-Fib on coumadin  . GERD (gastroesophageal reflux disease)   . Gout   . 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 History of blood transfusion   . Hypertension   . Sleep apnea    uses CPAP nightly    Family History   family history includes Cancer in her sister; Cancer - Lung in her father; Heart disease in her mother.  Prior Rehab/Hospitalizations Has the patient had prior rehab or hospitalizations prior to admission? Yes  Has the patient had major surgery during 100 days prior to admission? Yes   Current Medications  Current Facility-Administered Medications:  .  acetaminophen (TYLENOL) tablet 650 mg, 650 mg, Oral, Q6H PRN, 650 mg at 04/02/19 1104 **OR** acetaminophen (TYLENOL) suppository 650 mg, 650 mg, Rectal, Q6H PRN, Pool,  Mallie Mussel, MD .  allopurinol (ZYLOPRIM) tablet 300 mg, 300 mg, Oral, Daily, Earnie Larsson, MD, 300 mg at 04/04/19 0905 .  cyclobenzaprine (FLEXERIL) tablet 10 mg, 10 mg, Oral, TID PRN, Earnie Larsson, MD .  dexamethasone (DECADRON) injection 10 mg, 10 mg, Intravenous, Q6H, Earnie Larsson, MD, 10 mg at 04/04/19 1201 .  heparin injection 5,000 Units, 5,000 Units, Subcutaneous, Q8H, Ostergard, Thomas A, MD .  nebivolol (BYSTOLIC) tablet 10 mg, 10 mg, Oral, Daily, Pool, Mallie Mussel, MD, 10 mg at 04/04/19 0906 .  ondansetron (ZOFRAN) tablet 4 mg, 4 mg, Oral, Q6H PRN **OR** ondansetron (ZOFRAN) injection 4 mg, 4 mg, Intravenous, Q6H PRN, Earnie Larsson, MD .  oxyCODONE (Oxy IR/ROXICODONE) immediate release tablet 5 mg, 5 mg, Oral, Q4H PRN,  Earnie Larsson, MD, 5 mg at 04/02/19 1159 .  pantoprazole (PROTONIX) EC tablet 40 mg, 40 mg, Oral, Daily, Pool, Mallie Mussel, MD, 40 mg at 04/04/19 0906 .  potassium chloride (KLOR-CON) CR tablet 10 mEq, 10 mEq, Oral, BID, Earnie Larsson, MD, 10 mEq at 04/04/19 0907 .  senna-docusate (Senokot-S) tablet 1 tablet, 1 tablet, Oral, QHS PRN, Earnie Larsson, MD, 1 tablet at 04/04/19 0907 .  sertraline (ZOLOFT) tablet 100 mg, 100 mg, Oral, Daily, Pool, Mallie Mussel, MD, 100 mg at 04/04/19 0906 .  torsemide (DEMADEX) tablet 20 mg, 20 mg, Oral, Daily, Pool, Mallie Mussel, MD, 20 mg at 04/04/19 0906 .  Vitamin D (Ergocalciferol) (DRISDOL) capsule 50,000 Units, 50,000 Units, Oral, Weekly, Earnie Larsson, MD, 50,000 Units at 04/02/19 0840  Patients Current Diet:  Diet Order            Diet Carb Modified        Diet regular Room service appropriate? Yes; Fluid consistency: Thin  Diet effective now              Precautions / Restrictions Precautions Precautions: Cervical Precaution Booklet Issued: No Precaution Comments: reviewed cervical precautions Cervical Brace: Soft collar, At all times Restrictions Weight Bearing Restrictions: No   Has the patient had 2 or more falls or a fall with injury in the past year? Yes  Prior Activity Level Limited Community (1-2x/wk): Mod I with RW  Prior Functional Level Self Care: Did the patient need help bathing, dressing, using the toilet or eating? Needed some help  Indoor Mobility: Did the patient need assistance with walking from room to room (with or without device)? Needed some help  Stairs: Did the patient need assistance with internal or external stairs (with or without device)? Needed some help  Functional Cognition: Did the patient need help planning regular tasks such as shopping or remembering to take medications? [place] / Equipment Home Assistive Devices/Equipment: CPAP, Blood pressure cuff, Shower chair with back, Scales, Hand-held shower  hose, Eyeglasses, Grab bars around toilet, Cane (specify quad or straight), Walker (specify type) Home Equipment: Walker - 2 wheels, Walker - 4 wheels, Adaptive equipment  Prior Device Use: Indicate devices/aids used by the patient prior to current illness, exacerbation or injury? Walker  Current Functional Level Cognition  Overall Cognitive Status: Within Functional Limits for tasks assessed Orientation Level: Oriented X4    Extremity Assessment (includes Sensation/Coordination)  Upper Extremity Assessment: Defer to OT evaluation RUE Deficits / Details: 50* shoulder flex ROM; WFLs for elbow, wrist and hand. Numbness in middle finger RUE Coordination: decreased fine motor, decreased gross motor LUE Deficits / Details: 60* AROM all below the shoulder movements LUE Coordination: decreased fine motor  Lower Extremity Assessment: Generalized weakness(grossly 4/5 BLE,  numbness reported in L foot)    ADLs  Overall ADL's : Needs assistance/impaired Eating/Feeding: Set up, Sitting Grooming: Set up, Sitting Upper Body Bathing: Moderate assistance, Sitting, Cueing for safety Lower Body Bathing: Maximal assistance, +2 for physical assistance, +2 for safety/equipment, Cueing for safety, Cueing for sequencing, Sitting/lateral leans, Sit to/from stand Upper Body Dressing : Moderate assistance, Sitting, Cueing for safety Lower Body Dressing: Maximal assistance, +2 for physical assistance, +2 for safety/equipment, Sitting/lateral leans, Sit to/from stand, Cueing for safety Toilet Transfer: Maximal assistance, +2 for physical assistance, +2 for safety/equipment, RW, Stand-pivot, BSC Toilet Transfer Details (indicate cue type and reason): side stepping towards HOB; unsteady for BSC transfer today Toileting- Clothing Manipulation and Hygiene: Maximal assistance, +2 for physical assistance, +2 for safety/equipment, Sitting/lateral lean, Sit to/from stand, Cueing for safety Functional mobility during ADLs:  Moderate assistance, Maximal assistance, Rolling walker, +2 for safety/equipment, +2 for physical assistance General ADL Comments: Pt limited by pain in R shoulder decreased ability to care for self with weakness all over and pain.    Mobility  Overal bed mobility: Needs Assistance Bed Mobility: Rolling, Sidelying to Sit, Sit to Sidelying Rolling: Min assist Sidelying to sit: Mod assist Sit to sidelying: Mod assist General bed mobility comments: patient received in recliner    Transfers  Overall transfer level: Needs assistance Equipment used: Rolling walker (2 wheeled) Transfers: Sit to/from Stand, Stand Pivot Transfers Sit to Stand: Mod assist Stand pivot transfers: Min assist General transfer comment: modA with BUE support of RW    Ambulation / Gait / Stairs / Wheelchair Mobility  Ambulation/Gait Ambulation/Gait assistance: Herbalist (Feet): 3 Feet Assistive device: Rolling walker (2 wheeled) Gait Pattern/deviations: Step-to pattern, Shuffle General Gait Details: Patient able to take steps to pivot around to [facility] and back to recliner Gait velocity: reduced Gait velocity interpretation: <1.31 ft/sec, indicative of household ambulator    Posture / Balance Dynamic Sitting Balance Sitting balance - Comments: supervision Balance Overall balance assessment: Needs assistance Sitting-balance support: Feet supported Sitting balance-Leahy Scale: Good Sitting balance - Comments: supervision Standing balance support: Bilateral upper extremity supported, During functional activity Standing balance-Leahy Scale: Fair Standing balance comment: minA with BUE support    Special needs/care consideration BiPAP/CPAP  pta CPM  Continuous Drip IV  Dialysis         Life Vest  Oxygen  Special Bed  Trach Size  Wound Vac  Skin surgical incision Bowel mgmt: continent LBM 12/20 Bladder mgmt: external catheter Diabetic mgmt:  Behavioral consideration  Chemo/radiation   Designated visitor is John   Previous Environmental health practitioner  Living Arrangements: Spouse/significant other  Lives With: Spouse Available Help at Discharge: Family, Available 24 hours/day Type of Home: House Home Layout: One level Home Access: Stairs to enter Entrance Stairs-Rails: None Technical brewer of Steps: 2 Bathroom Shower/Tub: Multimedia programmer: Programmer, systems: Yes How Accessible: Accessible via walker Home Care Services: Yes Type of Home Care Services: Home PT, [place] (if known): [facility] Additional Comments: pt lives with her husband but he has back issues as well as a torn meniscus  Discharge Living Setting Plans for Discharge Living Setting: Patient's home, Lives with (comment)(spouse) Type of Home at Discharge: House Discharge Home Layout: One level Discharge Home Access: Stairs to enter Entrance Stairs-Rails: None Entrance Stairs-Number of Steps: 2 Discharge Bathroom Shower/Tub: Walk-in shower Discharge Bathroom Toilet: Standard Discharge [place] Accessibility: Yes How Accessible: Accessible via walker Does the patient have any problems obtaining  your medications?: No  Social/Family/Support Systems Patient Roles: Spouse, Parent Contact Information: spouse, Jenny Reichmann Anticipated Caregiver: spouse Anticipated Caregiver's Contact Information: [phone] Ability/Limitations of Caregiver: spouse has torn meniscus Caregiver Availability: 24/7 Discharge Plan Discussed with Primary Caregiver: Yes Is Caregiver In Agreement with Plan?: Yes Does Caregiver/Family have Issues with Lodging/Transportation while Pt is in Rehab?: No  Goals/Additional Needs Patient/Family Goal for Rehab: supervision PT, superivison to min OT Expected length of stay: ELOS 10 to 14 days Additional Information: patietn was d/c'd home and readmitted in less than 24 hrs Pt/Family Agrees to Admission and willing to participate:  Yes Program Orientation Provided & Reviewed with Pt/Caregiver Including Roles  & Responsibilities: Yes  Decrease burden of Care through IP rehab admission:  Possible need for SNF placement upon discharge:   Patient Condition: I have reviewed medical records from [facility] , spoken with CM, and patient. I met with patient at the bedside for inpatient rehabilitation assessment.  Patient will benefit from ongoing PT and OT, can actively participate in 3 hours of therapy a day 5 days of the week, and can make measurable gains during the admission.  Patient will also benefit from the coordinated team approach during an Inpatient Acute Rehabilitation admission.  The patient will receive intensive therapy as well as Rehabilitation physician, nursing, social worker, and care management interventions.  Due to bladder management, bowel management, safety, skin/wound care, disease management, medication administration, pain management and patient education the patient requires 24 hour a day rehabilitation nursing.  The patient is currently min to mod assist with mobility and basic ADLs.  Discharge setting and therapy post discharge at home with home health is anticipated.  Patient has agreed to participate in the Acute Inpatient Rehabilitation Program and will admit today.  Preadmission Screen Completed By:  Cleatrice Burke, 04/04/2019 1:01 PM ______________________________________________________________________   Discussed status with Dr. Ranell Patrick on 04/04/2019 at  1310 and received approval for admission today.  Admission Coordinator:  Cleatrice Burke, RN, time  4801 Date  04/04/2019   Assessment/Plan: Diagnosis: Cervical myelopathy 1. Does the need for close, 24 hr/day Medical supervision in concert with the patient's rehab needs make it unreasonable for this patient to be served in a less intensive setting? Yes 2. Co-Morbidities requiring supervision/potential complications:  hypokalemia, PAF, OSA, hyperglycemia, subdural/intradural collection with cord compression, iron deficiency anemia, anxiety disorder, thrombocytopenia, HTN/peripheral edema.  3. Due to bladder management, bowel management, safety, skin/wound care, disease management, medication administration, pain management and patient education, does the patient require 24 hr/day rehab nursing? Yes 4. Does the patient require coordinated care of a physician, rehab nurse, PT, OT, and SLP to address physical and functional deficits in the context of the above medical diagnosis(es)? Yes Addressing deficits in the following areas: balance, endurance, locomotion, strength, transferring, bowel/bladder control, bathing, dressing, feeding, grooming, toileting and psychosocial support 5. Can the patient actively participate in an intensive therapy program of at least 3 hrs of therapy 5 days a week? Yes 6. The potential for patient to make measurable gains while on inpatient rehab is excellent 7. Anticipated functional outcomes upon discharge from inpatient rehab: modified independent PT, modified independent OT, independent SLP 8. Estimated rehab length of stay to reach the above functional goals is: 10-14 days 9. Anticipated discharge destination: Home 10. Overall Rehab/Functional Prognosis: excellent   MD Signature: Leeroy Cha, MD

## 2019-04-04 NOTE — Progress Notes (Signed)
Pt arrived to unit, alert and oriented with soft collar in place, oriented to rehab, wears CPAP at home without O2

## 2019-04-04 NOTE — Progress Notes (Signed)
Izora Ribas, MD  Physician  Physical Medicine and Rehabilitation  PMR Pre-admission  Signed  Date of Service:  04/04/2019  1:01 PM      Related encounter: ED to Hosp-Admission (Discharged) from 04/01/2019 in [facility]      Signed        Show:Clear all [x] Manual[x] Template[x] Copied  Added by: [x] Cristina Gong, RN[x] Raulkar, Clide Deutscher, MD  [] Hover for details PMR Admission Coordinator Pre-Admission Assessment   Patient: Natasha Lawson is an 78 y.o., female MRN: [id number] DOB: 02/28/1941 Height: 5' 7"  (170.2 cm) Weight: 113 kg   Insurance Information HMO:     PPO:      PCP:      IPA:      80/20:      OTHER:  PRIMARY: Medicare a and b      Policy#: [id number]      Subscriber: pt Benefits:  Phone #: passport one online     Name: 12/21 Eff. Date: 03/14/2008     Deduct: $1408      Out of Pocket Max: none      Life Max: none CIR: 100%      SNF: 20 full days Outpatient: 80%     Co-Pay: 20% Home Health: 100%      Co-Pay: none DME: 80%     Co-Pay: 20% Providers: pt choice  SECONDARY: MEDICO      Policy#: [id number]      Subscriber: pt   Medicaid Application Date:       Case Manager:  Disability Application Date:       Case Worker:    The "Data Collection Information Summary" for patients in Inpatient Rehabilitation Facilities with attached "Privacy Act [place] Records" was provided and verbally reviewed with: Patient   Emergency Contact Information         Contact Information     Name Relation Home Work Mobile    Taha,JOHN Spouse     [phone]    teagyn, fishel Daughter     [phone]         Current Medical History  Patient Admitting Diagnosis: cervical myelopathy   History of Present Illness:    78 year old female with history of breast cancer, gut, HTN, OSA,  PAF (needed admission), attempts at ACDF (residual left sided weakness and neuropathy symptoms) in the past, gait disorder with falls who started developing  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 She was discharged to home on 12/17 but has had problems managing with recurrent falls and difficulty walking. She was admitted on 12/18 with MRI Cervical spine revealing post op changes with small subdural and/or intradural collection extending form C5-C7 with mass effect on dorsal and right aspect with moderate compression of cervical spinal cord without signal changes--fluid collection extends form C7 to upper thoracic spinal cord.  She was started on IV decadron and chronic coumadin to be held thorough POD# 10.    Patient's medical record from [facility]  has been reviewed by the rehabilitation admission coordinator and physician.   Past Medical History      Past Medical History:  Diagnosis Date  . Anxiety    . Cancer [facility])      breast cancer right  . Dysrhythmia      A-Fib on coumadin  . GERD (gastroesophageal reflux disease)    . Gout    . Hearing loss      some loss in right  ear  . History of blood transfusion    . Hypertension    . Sleep apnea      uses CPAP nightly      Family History   family history includes Cancer in her sister; Cancer - Lung in her father; Heart disease in her mother.   Prior Rehab/Hospitalizations Has the patient had prior rehab or hospitalizations prior to admission? Yes   Has the patient had major surgery during 100 days prior to admission? Yes              Current Medications   Current Facility-Administered Medications:  .  acetaminophen (TYLENOL) tablet 650 mg, 650 mg, Oral, Q6H PRN, 650 mg at 04/02/19 1104 **OR** acetaminophen (TYLENOL) suppository 650 mg, 650 mg, Rectal, Q6H PRN, Earnie Larsson, MD .  allopurinol (ZYLOPRIM) tablet 300 mg, 300 mg, Oral, Daily, Pool, Mallie Mussel, MD, 300 mg at 04/04/19 0905 .  cyclobenzaprine (FLEXERIL) tablet 10 mg, 10 mg, Oral, TID PRN, Earnie Larsson, MD .  dexamethasone (DECADRON) injection 10 mg, 10 mg,  Intravenous, Q6H, Earnie Larsson, MD, 10 mg at 04/04/19 1201 .  heparin injection 5,000 Units, 5,000 Units, Subcutaneous, Q8H, Ostergard, Thomas A, MD .  nebivolol (BYSTOLIC) tablet 10 mg, 10 mg, Oral, Daily, Pool, Mallie Mussel, MD, 10 mg at 04/04/19 0906 .  ondansetron (ZOFRAN) tablet 4 mg, 4 mg, Oral, Q6H PRN **OR** ondansetron (ZOFRAN) injection 4 mg, 4 mg, Intravenous, Q6H PRN, Earnie Larsson, MD .  oxyCODONE (Oxy IR/ROXICODONE) immediate release tablet 5 mg, 5 mg, Oral, Q4H PRN, Earnie Larsson, MD, 5 mg at 04/02/19 1159 .  pantoprazole (PROTONIX) EC tablet 40 mg, 40 mg, Oral, Daily, Pool, Mallie Mussel, MD, 40 mg at 04/04/19 0906 .  potassium chloride (KLOR-CON) CR tablet 10 mEq, 10 mEq, Oral, BID, Earnie Larsson, MD, 10 mEq at 04/04/19 0907 .  senna-docusate (Senokot-S) tablet 1 tablet, 1 tablet, Oral, QHS PRN, Earnie Larsson, MD, 1 tablet at 04/04/19 0907 .  sertraline (ZOLOFT) tablet 100 mg, 100 mg, Oral, Daily, Pool, Mallie Mussel, MD, 100 mg at 04/04/19 0906 .  torsemide (DEMADEX) tablet 20 mg, 20 mg, Oral, Daily, Pool, Mallie Mussel, MD, 20 mg at 04/04/19 0906 .  Vitamin D (Ergocalciferol) (DRISDOL) capsule 50,000 Units, 50,000 Units, Oral, Weekly, Earnie Larsson, MD, 50,000 Units at 04/02/19 0840   Patients Current Diet:     Diet Order                      Diet Carb Modified           Diet regular Room service appropriate? Yes; Fluid consistency: Thin  Diet effective now                   Precautions / Restrictions Precautions Precautions: Cervical Precaution Booklet Issued: No Precaution Comments: reviewed cervical precautions Cervical Brace: Soft collar, At all times Restrictions Weight Bearing Restrictions: No    Has the patient had 2 or more falls or a fall with injury in the past year? Yes   Prior Activity Level Limited Community (1-2x/wk): Mod I with RW   Prior Functional Level Self Care: Did the patient need help bathing, dressing, using the toilet or eating? Needed some help   Indoor Mobility: Did the  patient need assistance with walking from room to room (with or without device)? Needed some help   Stairs: Did the patient need assistance with internal or external stairs (with or without device)? Needed some help   Functional Cognition: Did the patient  need help planning regular tasks such as shopping or remembering to take medications? [place] / Equipment Home Assistive Devices/Equipment: CPAP, Blood pressure cuff, Shower chair with back, Scales, Hand-held shower hose, Eyeglasses, Grab bars around toilet, Cane (specify quad or straight), Walker (specify type) Home Equipment: Walker - 2 wheels, Walker - 4 wheels, Adaptive equipment   Prior Device Use: Indicate devices/aids used by the patient prior to current illness, exacerbation or injury? Walker   Current Functional Level Cognition   Overall Cognitive Status: Within Functional Limits for tasks assessed Orientation Level: Oriented X4    Extremity Assessment (includes Sensation/Coordination)   Upper Extremity Assessment: Defer to OT evaluation RUE Deficits / Details: 50* shoulder flex ROM; WFLs for elbow, wrist and hand. Numbness in middle finger RUE Coordination: decreased fine motor, decreased gross motor LUE Deficits / Details: 60* AROM all below the shoulder movements LUE Coordination: decreased fine motor  Lower Extremity Assessment: Generalized weakness(grossly 4/5 BLE, numbness reported in L foot)     ADLs   Overall ADL's : Needs assistance/impaired Eating/Feeding: Set up, Sitting Grooming: Set up, Sitting Upper Body Bathing: Moderate assistance, Sitting, Cueing for safety Lower Body Bathing: Maximal assistance, +2 for physical assistance, +2 for safety/equipment, Cueing for safety, Cueing for sequencing, Sitting/lateral leans, Sit to/from stand Upper Body Dressing : Moderate assistance, Sitting, Cueing for safety Lower Body Dressing: Maximal assistance, +2 for physical assistance, +2 for  safety/equipment, Sitting/lateral leans, Sit to/from stand, Cueing for safety Toilet Transfer: Maximal assistance, +2 for physical assistance, +2 for safety/equipment, RW, Stand-pivot, BSC Toilet Transfer Details (indicate cue type and reason): side stepping towards HOB; unsteady for BSC transfer today Toileting- Clothing Manipulation and Hygiene: Maximal assistance, +2 for physical assistance, +2 for safety/equipment, Sitting/lateral lean, Sit to/from stand, Cueing for safety Functional mobility during ADLs: Moderate assistance, Maximal assistance, Rolling walker, +2 for safety/equipment, +2 for physical assistance General ADL Comments: Pt limited by pain in R shoulder decreased ability to care for self with weakness all over and pain.     Mobility   Overal bed mobility: Needs Assistance Bed Mobility: Rolling, Sidelying to Sit, Sit to Sidelying Rolling: Min assist Sidelying to sit: Mod assist Sit to sidelying: Mod assist General bed mobility comments: patient received in recliner     Transfers   Overall transfer level: Needs assistance Equipment used: Rolling walker (2 wheeled) Transfers: Sit to/from Stand, Stand Pivot Transfers Sit to Stand: Mod assist Stand pivot transfers: Min assist General transfer comment: modA with BUE support of RW     Ambulation / Gait / Stairs / Wheelchair Mobility   Ambulation/Gait Ambulation/Gait assistance: Herbalist (Feet): 3 Feet Assistive device: Rolling walker (2 wheeled) Gait Pattern/deviations: Step-to pattern, Shuffle General Gait Details: Patient able to take steps to pivot around to [facility] and back to recliner Gait velocity: reduced Gait velocity interpretation: <1.31 ft/sec, indicative of household ambulator     Posture / Balance Dynamic Sitting Balance Sitting balance - Comments: supervision Balance Overall balance assessment: Needs assistance Sitting-balance support: Feet supported Sitting balance-Leahy Scale: Good Sitting  balance - Comments: supervision Standing balance support: Bilateral upper extremity supported, During functional activity Standing balance-Leahy Scale: Fair Standing balance comment: minA with BUE support     Special needs/care consideration BiPAP/CPAP  pta CPM  Continuous Drip IV  Dialysis         Life Vest  Oxygen  Special Bed  Trach Size  Wound Vac  Skin surgical incision Bowel mgmt: continent LBM 12/20  Bladder mgmt: external catheter Diabetic mgmt:  Behavioral consideration  Chemo/radiation  Designated visitor is Jenny Reichmann    Previous Environmental health practitioner  Living Arrangements: Spouse/significant other  Lives With: Spouse Available Help at Discharge: Family, Available 24 hours/day Type of Home: House Home Layout: One level Home Access: Stairs to enter Entrance Stairs-Rails: None Technical brewer of Steps: 2 Bathroom Shower/Tub: Multimedia programmer: Programmer, systems: Yes How Accessible: Accessible via walker Home Care Services: Yes Type of Home Care Services: Home PT, [place] (if known): [facility] Additional Comments: pt lives with her husband but he has back issues as well as a torn meniscus   Discharge Living Setting Plans for Discharge Living Setting: Patient's home, Lives with (comment)(spouse) Type of Home at Discharge: House Discharge Home Layout: One level Discharge Home Access: Stairs to enter Entrance Stairs-Rails: None Entrance Stairs-Number of Steps: 2 Discharge Bathroom Shower/Tub: Walk-in shower Discharge Bathroom Toilet: Standard Discharge [place] Accessibility: Yes How Accessible: Accessible via walker Does the patient have any problems obtaining your medications?: No   Social/Family/Support Systems Patient Roles: Spouse, Parent Contact Information: spouse, John Anticipated Caregiver: spouse Anticipated Caregiver's Contact Information: [phone] Ability/Limitations of Caregiver: spouse has  torn meniscus Caregiver Availability: 24/7 Discharge Plan Discussed with Primary Caregiver: Yes Is Caregiver In Agreement with Plan?: Yes Does Caregiver/Family have Issues with Lodging/Transportation while Pt is in Rehab?: No   Goals/Additional Needs Patient/Family Goal for Rehab: supervision PT, superivison to min OT Expected length of stay: ELOS 10 to 14 days Additional Information: patietn was d/c'd home and readmitted in less than 24 hrs Pt/Family Agrees to Admission and willing to participate: Yes Program Orientation Provided & Reviewed with Pt/Caregiver Including Roles  & Responsibilities: Yes   Decrease burden of Care through IP rehab admission:   Possible need for SNF placement upon discharge:    Patient Condition: I have reviewed medical records from [facility] , spoken with CM, and patient. I met with patient at the bedside for inpatient rehabilitation assessment.  Patient will benefit from ongoing PT and OT, can actively participate in 3 hours of therapy a day 5 days of the week, and can make measurable gains during the admission.  Patient will also benefit from the coordinated team approach during an Inpatient Acute Rehabilitation admission.  The patient will receive intensive therapy as well as Rehabilitation physician, nursing, social worker, and care management interventions.  Due to bladder management, bowel management, safety, skin/wound care, disease management, medication administration, pain management and patient education the patient requires 24 hour a day rehabilitation nursing.  The patient is currently min to mod assist with mobility and basic ADLs.  Discharge setting and therapy post discharge at home with home health is anticipated.  Patient has agreed to participate in the Acute Inpatient Rehabilitation Program and will admit today.   Preadmission Screen Completed By:  Cleatrice Burke, 04/04/2019 1:01  PM ______________________________________________________________________   Discussed status with Dr. Ranell Patrick on 04/04/2019 at  1310 and received approval for admission today.   Admission Coordinator:  Cleatrice Burke, RN, time  6415 Date  04/04/2019    Assessment/Plan: Diagnosis: Cervical myelopathy 1. Does the need for close, 24 hr/day Medical supervision in concert with the patient's rehab needs make it unreasonable for this patient to be served in a less intensive setting? Yes 2. Co-Morbidities requiring supervision/potential complications: hypokalemia, PAF, OSA, hyperglycemia, subdural/intradural collection with cord compression, iron deficiency anemia, anxiety disorder, thrombocytopenia, HTN/peripheral edema.  3. Due to bladder  management, bowel management, safety, skin/wound care, disease management, medication administration, pain management and patient education, does the patient require 24 hr/day rehab nursing? Yes 4. Does the patient require coordinated care of a physician, rehab nurse, PT, OT, and SLP to address physical and functional deficits in the context of the above medical diagnosis(es)? Yes Addressing deficits in the following areas: balance, endurance, locomotion, strength, transferring, bowel/bladder control, bathing, dressing, feeding, grooming, toileting and psychosocial support 5. Can the patient actively participate in an intensive therapy program of at least 3 hrs of therapy 5 days a week? Yes 6. The potential for patient to make measurable gains while on inpatient rehab is excellent 7. Anticipated functional outcomes upon discharge from inpatient rehab: modified independent PT, modified independent OT, independent SLP 8. Estimated rehab length of stay to reach the above functional goals is: 10-14 days 9. Anticipated discharge destination: Home 10. Overall Rehab/Functional Prognosis: excellent     MD Signature: Leeroy Cha, MD        Revision History

## 2019-04-04 NOTE — Progress Notes (Signed)
Chaplain visited to deliver [place] paperwork.  The patient informed the chaplain that they already had AD paperwork at home. The patient then discussed with the chaplain how important spirituality is to their health and wellness.  The chaplain provided an empathetic ear and supportive conversation.  The chaplain is available for follow-up if needed.  Brion Aliment Chaplain Resident For questions concerning this note please contact me by pager [phone]

## 2019-04-04 NOTE — TOC Transition Note (Signed)
Transition of Care [facility]) - CM/SW Discharge Note   Patient Details  Name: Natasha Lawson MRN: [id number] Date of Birth: 1940-05-03  Transition of Care [facility]) CM/SW Contact:  Pollie Friar, RN Phone Number: 04/04/2019, 11:23 AM   Clinical Narrative:    Patient is discharging to CIR today. CM signing off.    Final next level of care: IP Rehab Facility Barriers to Discharge: No Barriers Identified   Patient Goals and CMS Choice Patient states their goals for this hospitalization and ongoing recovery are:: Pt wants to feel better CMS Medicare.gov Compare Post Acute Care list provided to:: Patient Choice offered to / list presented to : Patient  Discharge Placement                       Discharge Plan and Services In-house Referral: Clinical Social Work   Post Acute Care Choice: IP Rehab                               Social Determinants of Health (SDOH) Interventions     Readmission Risk Interventions No flowsheet data found.

## 2019-04-04 NOTE — Progress Notes (Signed)
Inpatient Rehabilitation Admissions Coordinator  Inpatient rehab consult received. I met with pt at bedside to discuss goals an expectations. She prefers CIR rather than SNF. I have bed available and can admit pt today. I contacted Dr. Zada Finders and RN CM , Vida Roller. I will make the arrangements to admit today.  Danne Baxter, RN, MSN Rehab Admissions Coordinator [phone] 04/04/2019 10:58 AM

## 2019-04-04 NOTE — Discharge Instructions (Signed)
Discharge Instructions  No restriction in activities, slowly increase your activity back to normal.   Your incision is closed with dermabond (purple glue). This will naturally fall off over the next 1-2 weeks.   Okay to shower on the day of discharge. Use regular soap and water and try to be gentle when cleaning your incision.   Follow up with Dr. Zada Finders in 2 weeks after discharge. If you do not already have a discharge appointment, please call his office at [phone] to schedule a follow up appointment. If you have any concerns or questions, please call the office and let us know.  Restart your warfarin on 04/08/19.

## 2019-04-04 NOTE — Progress Notes (Signed)
Physical Therapy Treatment Patient Details Name: Natasha Lawson MRN: [id number] DOB: 05/29/1940 Today's Date: 04/04/2019    History of Present Illness Pt is a 79 yo female s/p C5-7 posterior cervical laminectomy and instrumented fusion on 12/15. Pt developing progressive weakness and x2 falls at home and pain in R shoulder. xray revealing no acute abnormalities, only bone spurring. PMHx: previous neck sx.    PT Comments    Patient received in recliner. States she needs her pure-wick replaced, offered to assist her up to [facility]. Patient agreeable to mobility. Requires mod assist for sit to stand from recliner and BSC. Able to take a few steps from recliner to/from BSc with rolling walker and min assist. She will continue to benefit from skilled PT to improve functional independence and improve strength.     Follow Up Recommendations  CIR;Supervision/Assistance - 24 hour     Equipment Recommendations  None recommended by PT    Recommendations for Other Services       Precautions / Restrictions Precautions Precautions: Cervical Precaution Booklet Issued: No Required Braces or Orthoses: Cervical Brace Cervical Brace: Soft collar;At all times Restrictions Weight Bearing Restrictions: No    Mobility  Bed Mobility               General bed mobility comments: patient received in recliner  Transfers Overall transfer level: Needs assistance Equipment used: Rolling walker (2 wheeled) Transfers: Sit to/from [facility] Sit to Stand: Mod assist Stand pivot transfers: Min assist       General transfer comment: modA with BUE support of RW  Ambulation/Gait Ambulation/Gait assistance: Min assist Gait Distance (Feet): 3 Feet Assistive device: Rolling walker (2 wheeled) Gait Pattern/deviations: Step-to pattern;Shuffle Gait velocity: reduced   General Gait Details: Patient able to take steps to pivot around to [facility] and back to recliner   Stairs              Wheelchair Mobility    Modified Rankin (Stroke Patients Only)       Balance Overall balance assessment: Needs assistance Sitting-balance support: Feet supported Sitting balance-Leahy Scale: Good Sitting balance - Comments: supervision   Standing balance support: Bilateral upper extremity supported;During functional activity Standing balance-Leahy Scale: Fair Standing balance comment: minA with BUE support                            Cognition Arousal/Alertness: Awake/alert Behavior During Therapy: WFL for tasks assessed/performed Overall Cognitive Status: Within Functional Limits for tasks assessed                                        Exercises      General Comments        Pertinent Vitals/Pain Pain Assessment: No/denies pain    Home Living                      Prior Function            PT Goals (current goals can now be found in the care plan section) Acute Rehab PT Goals Patient Stated Goal: to go to rehab to get stronger PT Goal Formulation: With patient Time For Goal Achievement: 04/16/19 Potential to Achieve Goals: Good Progress towards PT goals: Progressing toward goals    Frequency    Min 5X/week      PT Plan Current plan remains  appropriate    Co-evaluation              AM-PAC PT "6 Clicks" Mobility   Outcome Measure  Help needed turning from your back to your side while in a flat bed without using bedrails?: A Little Help needed moving from lying on your back to sitting on the side of a flat bed without using bedrails?: A Little Help needed moving to and from a bed to a chair (including a wheelchair)?: A Lot Help needed standing up from a chair using your arms (e.g., wheelchair or bedside chair)?: A Lot Help needed to walk in hospital room?: A Lot Help needed climbing 3-5 steps with a railing? : Total 6 Click Score: 13    End of Session Equipment Utilized During Treatment: Gait  belt Activity Tolerance: Patient tolerated treatment well Patient left: in chair;with call bell/phone within reach Nurse Communication: Mobility status PT Visit Diagnosis: Muscle weakness (generalized) (M62.81);Difficulty in walking, not elsewhere classified (R26.2);Unsteadiness on feet (R26.81)     Time: [id number] PT Time Calculation (min) (ACUTE ONLY): 32 min  Charges:  $Gait Training: 8-22 mins $Therapeutic Activity: 8-22 mins                     [employer], PT, GCS 04/04/19,12:26 PM

## 2019-04-04 NOTE — Discharge Summary (Addendum)
Discharge Summary  Date of Admission: 04/01/2019  Date of Discharge: 04/04/19  Attending Physician: Emelda Brothers, MD  Hospital Course: Patient was discharged last week after a posterior cervical decompression & instrumented fusion. She did well immediately post-op and was discharged home with home PT. But she had a fall at home and had significant difficulty transferring and ambulating, so she was readmitted for further evaluation and rehab. A post-op MRI showed some subdural but no epidural hematoma. Her neurologic exam continued to do well and was at baseline. She was seen by PT/OT, who recommended CIR. Her hospital course was uncomplicated and the patient was discharged to CIR on 04/04/19. She will follow up in clinic with me in 2-4 weeks.  Neurologic exam at discharge:  AOx3, Strength diffusely 4/5 x4 with diffuse patchy numbness, incision c/d/i   Discharge diagnosis: Cervical myelopathy  Judith Part, MD 04/04/19 10:55 AM   Current Facility-Administered Medications:  .  acetaminophen (TYLENOL) tablet 650 mg, 650 mg, Oral, Q6H PRN, 650 mg at 04/02/19 1104 **OR** acetaminophen (TYLENOL) suppository 650 mg, 650 mg, Rectal, Q6H PRN, Earnie Larsson, MD .  allopurinol (ZYLOPRIM) tablet 300 mg, 300 mg, Oral, Daily, Pool, Mallie Mussel, MD, 300 mg at 04/04/19 0905 .  cyclobenzaprine (FLEXERIL) tablet 10 mg, 10 mg, Oral, TID PRN, Earnie Larsson, MD .  dexamethasone (DECADRON) injection 10 mg, 10 mg, Intravenous, Q6H, Pool, Mallie Mussel, MD, 10 mg at 04/04/19 [id number] .  heparin injection 5,000 Units, 5,000 Units, Subcutaneous, Q8H, Adhrit Krenz A, MD .  nebivolol (BYSTOLIC) tablet 10 mg, 10 mg, Oral, Daily, Earnie Larsson, MD, 10 mg at 04/04/19 0906 .  ondansetron (ZOFRAN) tablet 4 mg, 4 mg, Oral, Q6H PRN **OR** ondansetron (ZOFRAN) injection 4 mg, 4 mg, Intravenous, Q6H PRN, Earnie Larsson, MD .  oxyCODONE (Oxy IR/ROXICODONE) immediate release tablet 5 mg, 5 mg, Oral, Q4H PRN, Earnie Larsson, MD, 5 mg at  04/02/19 1159 .  pantoprazole (PROTONIX) EC tablet 40 mg, 40 mg, Oral, Daily, Pool, Mallie Mussel, MD, 40 mg at 04/04/19 0906 .  potassium chloride (KLOR-CON) CR tablet 10 mEq, 10 mEq, Oral, BID, Earnie Larsson, MD, 10 mEq at 04/04/19 0907 .  senna-docusate (Senokot-S) tablet 1 tablet, 1 tablet, Oral, QHS PRN, Earnie Larsson, MD, 1 tablet at 04/04/19 0907 .  sertraline (ZOLOFT) tablet 100 mg, 100 mg, Oral, Daily, Pool, Mallie Mussel, MD, 100 mg at 04/04/19 0906 .  torsemide (DEMADEX) tablet 20 mg, 20 mg, Oral, Daily, Pool, Mallie Mussel, MD, 20 mg at 04/04/19 0906 .  Vitamin D (Ergocalciferol) (DRISDOL) capsule 50,000 Units, 50,000 Units, Oral, Weekly, Earnie Larsson, MD, 50,000 Units at 04/02/19 [phone]

## 2019-04-04 NOTE — Progress Notes (Addendum)
Neurosurgery Service Progress Note  Subjective: No acute events overnight, shoulder pain improving   Objective: Vitals:   04/03/19 1537 04/03/19 1934 04/03/19 2356 04/04/19 0412  BP: (!) 138/50 (!) 146/82 (!) 150/70 (!) 150/64  Pulse: 75 68 72 62  Resp: 18 18 18 18   Temp: 98.3 F (36.8 C) 97.6 F (36.4 C) 98.2 F (36.8 C) 97.8 F (36.6 C)  TempSrc: Oral Oral Oral Oral  SpO2: 97% 94% 96% 97%  Weight:      Height:       Temp (24hrs), Avg:98.1 F (36.7 C), Min:97.6 F (36.4 C), Max:98.5 F (36.9 C)  CBC Latest Ref Rng & Units 04/01/2019 03/25/2019  WBC 4.0 - 10.5 K/uL 7.9 5.6  Hemoglobin 12.0 - 15.0 g/dL 9.6(L) 11.3(L)  Hematocrit 36.0 - 46.0 % 29.1(L) 35.1(L)  Platelets 150 - 400 K/uL 125(L) 146(L)   BMP Latest Ref Rng & Units 04/01/2019 03/25/2019  Glucose 70 - 99 mg/dL 103(H) 111(H)  BUN 8 - 23 mg/dL 16 24(H)  Creatinine 0.44 - 1.00 mg/dL 0.83 0.97  Sodium 135 - 145 mmol/L 143 143  Potassium 3.5 - 5.1 mmol/L 2.9(L) 3.2(L)  Chloride 98 - 111 mmol/L 107 106  CO2 22 - 32 mmol/L 26 29  Calcium 8.9 - 10.3 mg/dL 8.5(L) 8.9    Intake/Output Summary (Last 24 hours) at 04/04/2019 0743 Last data filed at 04/04/2019 0413 Gross per 24 hour  Intake 2794.73 ml  Output 1150 ml  Net 1644.73 ml    Current Facility-Administered Medications:  .  acetaminophen (TYLENOL) tablet 650 mg, 650 mg, Oral, Q6H PRN, 650 mg at 04/02/19 1104 **OR** acetaminophen (TYLENOL) suppository 650 mg, 650 mg, Rectal, Q6H PRN, Earnie Larsson, MD .  allopurinol (ZYLOPRIM) tablet 300 mg, 300 mg, Oral, Daily, Pool, Mallie Mussel, MD, 300 mg at 04/03/19 0903 .  cyclobenzaprine (FLEXERIL) tablet 10 mg, 10 mg, Oral, TID PRN, Earnie Larsson, MD .  dexamethasone (DECADRON) injection 10 mg, 10 mg, Intravenous, Q6H, Pool, Mallie Mussel, MD, 10 mg at 04/04/19 [id number] .  nebivolol (BYSTOLIC) tablet 10 mg, 10 mg, Oral, Daily, Earnie Larsson, MD, 10 mg at 04/03/19 0914 .  ondansetron (ZOFRAN) tablet 4 mg, 4 mg, Oral, Q6H PRN **OR**  ondansetron (ZOFRAN) injection 4 mg, 4 mg, Intravenous, Q6H PRN, Earnie Larsson, MD .  oxyCODONE (Oxy IR/ROXICODONE) immediate release tablet 5 mg, 5 mg, Oral, Q4H PRN, Earnie Larsson, MD, 5 mg at 04/02/19 1159 .  pantoprazole (PROTONIX) EC tablet 40 mg, 40 mg, Oral, Daily, Pool, Mallie Mussel, MD, 40 mg at 04/03/19 0903 .  potassium chloride (KLOR-CON) CR tablet 10 mEq, 10 mEq, Oral, BID, Earnie Larsson, MD, 10 mEq at 04/03/19 2258 .  senna-docusate (Senokot-S) tablet 1 tablet, 1 tablet, Oral, QHS PRN, Earnie Larsson, MD, 1 tablet at 04/03/19 0914 .  sertraline (ZOLOFT) tablet 100 mg, 100 mg, Oral, Daily, Pool, Mallie Mussel, MD, 100 mg at 04/03/19 0903 .  torsemide (DEMADEX) tablet 20 mg, 20 mg, Oral, Daily, Pool, Mallie Mussel, MD, 20 mg at 04/03/19 0903 .  Vitamin D (Ergocalciferol) (DRISDOL) capsule 50,000 Units, 50,000 Units, Oral, Weekly, Pool, Mallie Mussel, MD, 50,000 Units at 04/02/19 0840   Physical Exam: Strength 4/5 in BUE/BLE, sensation diffusely decreased but at baseline Incision c/d/i  Assessment & Plan: 78 y.o. 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 Readmitted post-op for falls and difficulty ambulating at home. MRI with cervical subdural, no epidural hematoma.  -PT/OT/PM&R recs pending, possible CIR discharge -SCDs/TEDs, start SQH -  post-op MRI w/ spinal subdural, should resolve without issue, but will hold warfarin until [place]  04/04/19 7:43 AM

## 2019-04-04 NOTE — Care Management Important Message (Signed)
Important Message  Patient Details  Name: Natasha Lawson MRN: [id number] Date of Birth: 1940-05-20   Medicare Important Message Given:  Yes     Orbie Pyo 04/04/2019, 2:25 PM

## 2019-04-04 NOTE — Progress Notes (Signed)
Report called to CIR nurse, patient belongings packed, and patient taken via bed to CIR for rehab.

## 2019-04-04 NOTE — Progress Notes (Signed)
Pt refused CPAP tonight. Pt stated that the CPAP puts pressure on her neck and causes pain. Pt stated that on previous unit, she slept w/ 2L of oxygen, but currently there's no order for oxygen. Pt resting w/ HOB raised, will continue to monitor.

## 2019-04-05 ENCOUNTER — Inpatient Hospital Stay (HOSPITAL_COMMUNITY): Payer: PRIVATE HEALTH INSURANCE | Admitting: Physical Therapy

## 2019-04-05 ENCOUNTER — Inpatient Hospital Stay (HOSPITAL_COMMUNITY): Payer: PRIVATE HEALTH INSURANCE | Admitting: Occupational Therapy

## 2019-04-05 DIAGNOSIS — Z7901 Long term (current) use of anticoagulants: Secondary | ICD-10-CM

## 2019-04-05 DIAGNOSIS — M4712 Other spondylosis with myelopathy, cervical region: Secondary | ICD-10-CM

## 2019-04-05 DIAGNOSIS — G8250 Quadriplegia, unspecified: Secondary | ICD-10-CM | POA: Diagnosis present

## 2019-04-05 DIAGNOSIS — I10 Essential (primary) hypertension: Secondary | ICD-10-CM | POA: Diagnosis present

## 2019-04-05 DIAGNOSIS — I4891 Unspecified atrial fibrillation: Secondary | ICD-10-CM | POA: Diagnosis present

## 2019-04-05 LAB — COMPREHENSIVE METABOLIC PANEL
ALT: 37 U/L (ref 0–44)
AST: 30 U/L (ref 15–41)
Albumin: 2.8 g/dL — ABNORMAL LOW (ref 3.5–5.0)
Alkaline Phosphatase: 79 U/L (ref 38–126)
Anion gap: 12 (ref 5–15)
BUN: 41 mg/dL — ABNORMAL HIGH (ref 8–23)
CO2: 26 mmol/L (ref 22–32)
Calcium: 8.6 mg/dL — ABNORMAL LOW (ref 8.9–10.3)
Chloride: 107 mmol/L (ref 98–111)
Creatinine, Ser: 1.04 mg/dL — ABNORMAL HIGH (ref 0.44–1.00)
GFR calc Af Amer: >60 mL/min (ref >60)
GFR calc non Af Amer: 52 mL/min — ABNORMAL LOW (ref >60)
Glucose, Bld: 166 mg/dL — ABNORMAL HIGH (ref 70–99)
Potassium: 3.9 mmol/L (ref 3.5–5.1)
Sodium: 145 mmol/L (ref 135–145)
Total Bilirubin: 0.6 mg/dL (ref 0.3–1.2)
Total Protein: 5.5 g/dL — ABNORMAL LOW (ref 6.5–8.1)

## 2019-04-05 LAB — CBC WITH DIFFERENTIAL/PLATELET
Abs Immature Granulocytes: 0.17 10*3/uL — ABNORMAL HIGH (ref 0.00–0.07)
Basophils Absolute: 0 10*3/uL (ref 0.0–0.1)
Basophils Relative: 0 %
Eosinophils Absolute: 0 10*3/uL (ref 0.0–0.5)
Eosinophils Relative: 0 %
HCT: 31.4 % — ABNORMAL LOW (ref 36.0–46.0)
Hemoglobin: 10.2 g/dL — ABNORMAL LOW (ref 12.0–15.0)
Immature Granulocytes: 3 %
Lymphocytes Relative: 8 %
Lymphs Abs: 0.4 10*3/uL — ABNORMAL LOW (ref 0.7–4.0)
MCH: 32.9 pg (ref 26.0–34.0)
MCHC: 32.5 g/dL (ref 30.0–36.0)
MCV: 101.3 fL — ABNORMAL HIGH (ref 80.0–100.0)
Monocytes Absolute: 0.3 10*3/uL (ref 0.1–1.0)
Monocytes Relative: 5 %
Neutro Abs: 4.4 10*3/uL (ref 1.7–7.7)
Neutrophils Relative %: 84 %
Platelets: 155 10*3/uL (ref 150–400)
RBC: 3.1 MIL/uL — ABNORMAL LOW (ref 3.87–5.11)
RDW: 14 % (ref 11.5–15.5)
WBC: 5.3 10*3/uL (ref 4.0–10.5)
nRBC: 0 % (ref 0.0–0.2)

## 2019-04-05 LAB — GLUCOSE, CAPILLARY
Glucose-Capillary: 152 mg/dL — ABNORMAL HIGH (ref 70–99)
Glucose-Capillary: 114 mg/dL — ABNORMAL HIGH (ref 70–99)
Glucose-Capillary: 176 mg/dL — ABNORMAL HIGH (ref 70–99)
Glucose-Capillary: 170 mg/dL — ABNORMAL HIGH (ref 70–99)

## 2019-04-05 LAB — HEMOGLOBIN A1C
Hgb A1c MFr Bld: 5.3 % (ref 4.8–5.6)
Mean Plasma Glucose: 105.41 mg/dL

## 2019-04-05 MED ORDER — METAXALONE 800 MG PO TABS
800.0000 mg | ORAL_TABLET | Freq: Three times a day (TID) | ORAL | Status: DC | PRN
  Administered 2019-04-06 – 2019-04-10 (×6): 800 mg via ORAL
  Filled 2019-04-05 (×7): qty 1

## 2019-04-05 MED ORDER — DEXAMETHASONE 4 MG PO TABS
10.0000 mg | ORAL_TABLET | Freq: Four times a day (QID) | ORAL | Status: DC
  Administered 2019-04-05 – 2019-04-11 (×23): 10 mg via ORAL
  Filled 2019-04-05 (×23): qty 3

## 2019-04-05 NOTE — Evaluation (Signed)
Physical Therapy Assessment and Plan  Patient Details  Name: Natasha Lawson MRN: [id number] Date of Birth: June 13, 1940  PT Diagnosis: Abnormality of gait, Difficulty walking, Impaired sensation and Muscle weakness Rehab Potential: Good ELOS: 14-16 days   Today's Date: 04/05/2019 PT Individual Time: 0915-1010 PT Individual Time Calculation (min): 55 min    Problem List:  Patient Active Problem List   Diagnosis Date Noted  . HTN (hypertension) 04/05/2019  . Quadriplegia ([place]) 04/05/2019  . Atrial fibrillation ([place]) 04/05/2019  . Anticoagulated on Coumadin 04/05/2019  . 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  . Cervical myelopathy ([place]) 03/29/2019    Past Medical History:  Past Medical History:  Diagnosis Date  . Anxiety   . Cancer [facility])    breast cancer right  . Dysrhythmia    A-Fib on coumadin  . GERD (gastroesophageal reflux disease)   . Gout   . 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 History of blood transfusion   . Hypertension   . Sleep apnea    uses CPAP nightly   Past Surgical History:  Past Surgical History:  Procedure Laterality Date  . ABDOMINAL HYSTERECTOMY    . BREAST SURGERY Right 08/14/2014   lumpectomy/lynph nodes  . BREAST SURGERY Right 10/05/2016   mass removed  . CARDIAC CATHETERIZATION    . CARPAL TUNNEL RELEASE Bilateral 10/01/11, 12/02/11  . CHOLECYSTECTOMY  1987  . COLONOSCOPY    . DILATION AND CURETTAGE OF UTERUS  10/10/2008   hysteroscopy with biopsy  . EYE SURGERY Bilateral    removed cataracts  . HYSTEROSCOPY WITH D & C  08/19/2011  . INCONTINENCE SURGERY  01/11/2007  . JOINT REPLACEMENT Right 10/18/2013  . laparoscopy bilateral salpingo-oophorectomy  01/27/2012  . NECK SURGERY  11/16/2017  . pci lad  01/02/2015   stent   . POSTERIOR CERVICAL FUSION/FORAMINOTOMY N/A 03/29/2019   Procedure: Cervical Five to Cervical Seven Posterior cervical laminectomy and instrumented fusion;  Surgeon: Judith Part, MD;  Location: [facility];  Service: Neurosurgery;  Laterality: N/A;  Cervical Five to Cervical Seven Posterior cervical laminectomy and instrumented fusion  . radiation breast canceer Right    from 12/08/14-01/12/15  . rotator cuff  Right 04/26/2010   repair  . TONSILLECTOMY  1954  . TUBAL LIGATION  1984  . UPPER GI ENDOSCOPY  2016    Assessment & Plan Clinical Impression:  Natasha Lawson is a 78 year old female with history of breast cancer, gut, HTN, OSA,  PAF (needed admission), attempts at ACDF (residual left sided weakness and neuropathy symptoms) in the past, gait disorder with falls who 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 She was discharged to home on 12/16 but has had problems managing with recurrent falls and difficulty walking. She was admitted on 12/18 with MRI Cervical spine revealing post op changes with small subdural and/or intradural collection extending form C5-C7 with mass effect on dorsal and right aspect with moderate compression of cervical spinal cord without signal changes--fluid collection extends form C7 to upper thoracic spinal cord.  She was started on IV decadron and chronic coumadin to be held thorough POD# 10. Therapy evaluations done revealing functional deficits and CIR recommended due to myelopathy. Patient transferred to CIR on 04/04/2019 .   Patient currently requires mod with mobility secondary to muscle weakness, abnormal tone and unbalanced muscle activation and decreased standing balance, decreased postural control and decreased balance strategies.  Prior to  hospitalization, patient was modified independent  with mobility and lived with Spouse in a House home.  Home access is 2Stairs to enter.  Patient will benefit from skilled PT intervention to maximize safe functional mobility, minimize fall risk and decrease caregiver burden for planned discharge home with 24 hour supervision.  Anticipate patient will  benefit from follow up [place] at discharge.  PT - End of Session Activity Tolerance: Tolerates 30+ min activity with multiple rests Endurance Deficit: Yes Endurance Deficit Description: frequent rest breaks during functional activities PT Assessment Rehab Potential (ACUTE/IP ONLY): Good PT Barriers to Discharge: Medical stability;Home environment access/layout;Decreased caregiver support PT Patient demonstrates impairments in the following area(s): Balance;Endurance;Motor;Safety;Sensory PT Transfers Functional Problem(s): Bed Mobility;Bed to Chair;Car;Furniture;Floor PT Locomotion Functional Problem(s): Ambulation;Wheelchair Mobility;Stairs PT Plan PT Intensity: Minimum of 1-2 x/day ,45 to 90 minutes PT Frequency: 5 out of 7 days PT Duration Estimated Length of Stay: 14-16 days PT Treatment/Interventions: Ambulation/gait training;Balance/vestibular training;Community reintegration;Discharge planning;DME/adaptive equipment instruction;Functional mobility training;Neuromuscular re-education;Patient/family education;Psychosocial support;Stair training;Therapeutic Activities;Therapeutic Exercise;UE/LE Strength taining/ROM;UE/LE Coordination activities PT Transfers Anticipated Outcome(s): Supervision PT Locomotion Anticipated Outcome(s): Supervision with LRAD PT Recommendation Recommendations for Other Services: Therapeutic Recreation consult Therapeutic Recreation Interventions: Stress management Follow Up Recommendations: Home health PT Patient destination: Home Equipment Recommended: Rolling walker with 5" wheels Equipment Details: pt owns rollator, may benefit from use of RW for improved safety  Skilled Therapeutic Intervention Evaluation completed (see details above and below) with education on PT POC and goals and individual treatment initiated with focus on functional transfer assessment, orientation to rehab unit, and gait assessment. Pt received seated in w/c in room, agreeable to PT  eval. No complaints of pain. Manual w/c propulsion 2 x 150 ft with use of BUE at Supervision level. Sit to stand with mod A to RW. Ambulation x 50 ft with min A and use of RW with close w/c follow from a 2nd person for safety. Pt reports L knee buckling x one instance during gait but does not lose balance. Pt exhibits decreased B step length and stance time on each limb during gait. Pt requests to return to bed at end of session. Stand pivot transfer w/c to bed with RW and min to mod A. Sit to supine mod A for BLE management. Pt left semi-reclined in bed with needs in reach at end of session.  PT Evaluation Precautions/Restrictions Precautions Precautions: Cervical Required Braces or Orthoses: Cervical Brace Cervical Brace: For comfort;Soft collar Restrictions Weight Bearing Restrictions: No Home Living/Prior Functioning Home Living Available Help at Discharge: Family;Available 24 hours/day Type of Home: House Home Access: Stairs to enter [employer] of Steps: 2 Entrance Stairs-Rails: None Home Layout: One level Bathroom Shower/Tub: Tub/shower unit(Uses TTB) Bathroom Toilet: Standard Bathroom Accessibility: Yes Additional Comments: Lives with husband who assisted with ADLs and completed all IADLs  Lives With: Spouse Prior Function Level of Independence: Independent with gait;Independent with transfers;Requires assistive device for independence  Able to Take Stairs?: Yes Driving: No Vision/Perception  Perception Perception: Within Functional Limits Praxis Praxis: Intact  Cognition Overall Cognitive Status: Within Functional Limits for tasks assessed Arousal/Alertness: Awake/alert Orientation Level: Oriented X4 Attention: Focused Focused Attention: Appears intact Memory: Appears intact Immediate Memory Recall: Sock;Blue;Bed Memory Recall Sock: Without Cue Memory Recall Blue: Without Cue Memory Recall Bed: Without Cue Awareness: Appears intact Problem Solving:  Appears intact Safety/Judgment: Appears intact Sensation Sensation Light Touch: Impaired Detail Light Touch Impaired Details: Impaired RLE;Impaired LLE;Impaired RUE;Impaired LUE Proprioception: Appears Intact Coordination Gross Motor Movements are Fluid and Coordinated: No  Fine Motor Movements are Fluid and Coordinated: Yes Coordination and Movement Description: Impaired 2/2 pain and weakness Motor  Motor Motor: Other (comment) Motor - Skilled Clinical Observations: Generalized weakness and deconditioning  Mobility Bed Mobility Bed Mobility: Rolling Right;Rolling Left;Supine to Sit;Sit to Supine Rolling Right: Minimal Assistance - Patient > 75% Rolling Left: Minimal Assistance - Patient > 75% Supine to Sit: Moderate Assistance - Patient 50-74% Sit to Supine: Moderate Assistance - Patient 50-74% Transfers Transfers: Sit to Stand;Stand Pivot Transfers Sit to Stand: Moderate Assistance - Patient 50-74% Stand Pivot Transfers: Moderate Assistance - Patient 50 - 74% Transfer (Assistive device): Rolling walker Locomotion  Gait Gait Distance (Feet): 50 Feet Assistive device: Rolling walker Gait Gait Pattern: Impaired Gait velocity: decreased Stairs / Additional Locomotion Stairs: No Wheelchair Mobility Wheelchair Mobility: Yes Wheelchair Assistance: Chartered loss adjuster: Both upper extremities Wheelchair Parts Management: Needs assistance Distance: 150  Trunk/Postural Assessment  Cervical Assessment Cervical Assessment: Exceptions to WFL(cervical precautions) Thoracic Assessment Thoracic Assessment: Exceptions to WFL(kyphotic; rounded shoulders) Lumbar Assessment Lumbar Assessment: Exceptions to WFL(posterior pelvic tilt) Postural Control Postural Control: Deficits on evaluation  Balance Balance Balance Assessed: Yes Static Sitting Balance Static Sitting - Balance Support: Feet supported;No upper extremity supported Static Sitting - Level of  Assistance: 6: Modified independent (Device/Increase time) Static Sitting - Comment/# of Minutes: Sitting EOB Dynamic Sitting Balance Dynamic Sitting - Balance Support: Feet supported;During functional activity;No upper extremity supported Dynamic Sitting - Level of Assistance: 5: Stand by assistance Sitting balance - Comments: Sitting on TTB to complete bathing routine Static Standing Balance Static Standing - Balance Support: During functional activity;Bilateral upper extremity supported Static Standing - Level of Assistance: 4: Min assist Static Standing - Comment/# of Minutes: Standing with RW Dynamic Standing Balance Dynamic Standing - Balance Support: During functional activity;Right upper extremity supported;Left upper extremity supported Dynamic Standing - Level of Assistance: 4: Min assist;3: Mod assist Dynamic Standing - Comments: Standing to compete LB clothing management Extremity Assessment  RUE Assessment RUE Assessment: Within Functional Limits General Strength Comments: 4/5 throughout LUE Assessment LUE Assessment: Exceptions to [facility] Active Range of Motion (AROM) Comments: ~100 degrees shoulder flexion; all other [facility] General Strength Comments: 4-/5 LUE AROM (degrees) Overall AROM Left Upper Extremity: Deficits RLE Assessment RLE Assessment: Exceptions to [facility] General Strength Comments: impaired, see below RLE Strength Right Hip Flexion: 3+/5 Right Knee Flexion: 4+/5 Right Knee Extension: 4+/5 Right Ankle Dorsiflexion: 4/5 LLE Assessment LLE Assessment: Exceptions to [facility] General Strength Comments: impaired, see below LLE Strength Left Hip Flexion: 4/5 Left Knee Flexion: 3+/5 Left Knee Extension: 4+/5 Left Ankle Dorsiflexion: 4/5    Refer to Care Plan for Long Term Goals  Recommendations for other services: Therapeutic Recreation  Stress management  Discharge Criteria: Patient will be discharged from PT if patient refuses treatment 3 consecutive times  without medical reason, if treatment goals not met, if there is a change in medical status, if patient makes no progress towards goals or if patient is discharged from hospital.  The above assessment, treatment plan, treatment alternatives and goals were discussed and mutually agreed upon: by patient   Excell Seltzer, PT, DPT 04/05/2019, 4:16 PM

## 2019-04-05 NOTE — Progress Notes (Signed)
[place] PHYSICAL MEDICINE & REHABILITATION PROGRESS NOTE   Subjective/Complaints: Pt reports feeling pretty good- said they restarted Coumadin on 12/20- has pain only with movement- no pain with staying still- admits here for rehab/movement.  Also c/o hallucinations- seeing things like a moving old time movie sometimes [employer] sometimes in color, but in outline.  Has had for "a few days"- not related to pain meds per pt. Sounds like maybe Flexeril since [facility] effects- will try Skelaxin instead.  BM- last one was last night- voiding well   ROS- pt denies SOB, CP, Abd pain, HA, Constipation, N/V/D Objective:   No results found. Recent Labs    04/05/19 0540  WBC 5.3  HGB 10.2*  HCT 31.4*  PLT 155   Recent Labs    04/05/19 0540  NA 145  K 3.9  CL 107  CO2 26  GLUCOSE 166*  BUN 41*  CREATININE 1.04*  CALCIUM 8.6*   No intake or output data in the 24 hours ending 04/05/19 1041   Physical Exam: Vital Signs Blood pressure (!) 164/75, pulse 69, temperature 98.2 F (36.8 C), temperature source Oral, resp. rate 18, height 5\' 7"  (1.702 m), weight 112.7 kg, SpO2 97 %.  Physical Exam  Nursing note and vitals reviewed. General: Alert and oriented x 3, No apparent distress. Obese,  soft collar in place; in manual w/c at PT in room, NAD HEENT: Head is normocephalic, atraumatic, PERRLA, EOMI, sclera anicteric, oral mucosa pink and moist, dentition intact, Neck: tight trigger points in upper traps/levators/scalenes, and rhomboids Heart: irregular; rate controlled No murmurs rubs or gallops Chest: CTA bilaterally without wheezes, rales, or rhonchi; no distress Abdomen: Soft, non-tender, non-distended, bowel sounds positive. Extremities: No clubbing, cyanosis, or edema. Pulses are 2+ Skin: Posterior cervical spine incision with mild erythema but C/D/I  Neuro: She is alert and oriented to person, place, and time.  Has diffuse patchy numbness present in all extremities.   Musculoskeletal: 1+ pedal edema.  Well healed right TKR. 4-/5 strength on LUE and 4/5 strength in other extremities.  Psych: Pt's affect is appropriate. Pt is cooperative     Assessment/Plan: 1. 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  Physiatrist is providing close team supervision and 24 hour management of active medical problems listed below.  Physiatrist and rehab team continue to assess barriers to discharge/monitor patient progress toward functional and medical goals  Care Tool:  Bathing    Body parts bathed by patient: Right arm, Left arm, Abdomen, Chest, Right upper leg, Left upper leg, Face   Body parts bathed by helper: Front perineal area, Buttocks, Right lower leg, Left lower leg     Bathing assist Assist Level: Moderate Assistance - Patient 50 - 74%     Upper Body Dressing/Undressing Upper body dressing   What is the patient wearing?: Pull over shirt    Upper body assist Assist Level: Minimal Assistance - Patient > 75%    Lower Body Dressing/Undressing Lower body dressing      What is the patient wearing?: Underwear/pull up     Lower body assist Assist for lower body dressing: Maximal Assistance - Patient 25 - 49%     Toileting Toileting    Toileting assist Assist for toileting: Maximal Assistance - Patient 25 - 49%     Transfers Chair/bed transfer  Transfers assist  Chair/bed transfer activity did not occur:  N/A  Chair/bed transfer assist level: Moderate Assistance - Patient 50 - 74%     Locomotion Ambulation   Ambulation assist              Walk 10 feet activity   Assist           Walk 50 feet activity   Assist           Walk 150 feet activity   Assist           Walk 10 feet on uneven surface  activity   Assist            Wheelchair     Assist               Wheelchair 50 feet with 2 turns activity    Assist            Wheelchair 150 feet activity     Assist          Blood pressure (!) 164/75, pulse 69, temperature 98.2 F (36.8 C), temperature source Oral, resp. rate 18, height 5\' 7"  (1.702 m), weight 112.7 kg, SpO2 97 %.  Medical Problem List and Plan: 1.  Impaired mobility and ADLs secondary to cervical myelopathy             -patient may shower, but spinal incision should be covered             -ELOS/Goals: ModI in PT, OT, I in SLP 2.  Antithrombotics: -DVT/anticoagulation:  Pharmaceutical: Heparin             -antiplatelet therapy: ASA             -chronic coumadin is being held until post-op day #10  12/22- restarted 12/20 per pt, but not on list- needs to restart 12/25! 3. Pain Management:  Oxycodone prn  12/22- will stop Flexeril since likely causing hallucinations and start Skelaxin 800 mg TID prn for muscle spasms/tightness 4. Mood: LCSW to follow for evaluation and support.              -antipsychotic agents: NA 5. Neuropsych: This patient is capable of making decisions on her own behalf. 6. Skin/Wound Care: routine pressure relief measures.  7. Fluids/Electrolytes/Nutrition: Monitor I/O. Check lytes in am.  8.  Subdural/intradural collection with cord compression: On IV decadron 10 mg every 4 hours- per NSU, no need for additional surgery. 9. Hyperglycemia: has h/o diet controlled DM in the past. Will monitor BS ac/hs as now on steroids.  10.  OSA: continue CPAP at nights.  12/22- pt refusing- said wants 2L O2 at night.   11. PAF: On Bystolic bid.  Monitor HR TID--continue to hold coumadin thorough  12. H/o iron deficiency anemia: 13. H/o Anxiety disorder: Has been stable on Zoloft.  14. Hypokalemia: Was on Kdur 10 meq bid but has had progressive drop 3.2-->2.9. Will increase to 40 meq bid for today and recheck labs in am. Adjust as needed.   12/22- K+ 3.9-  well controlled 15. Thrombocytopenia: Recheck in am.   12/22- plts 155k 16. HTN/Peripheral edema: On Demadex daily  17. Atrial fibrillation: Coumadin will be restarted on post-op day #10.       LOS: 1 days A FACE TO FACE EVALUATION WAS PERFORMED  Annalia Metzger 04/05/2019, 10:41 AM

## 2019-04-05 NOTE — Patient Care Conference (Signed)
Inpatient RehabilitationTeam Conference and Plan of Care Update Date: 04/05/2019   Time: 11:15 AM    Patient Name: Natasha Lawson      Medical Record Number: [id number]  Date of Birth: 1940/05/08 Sex: Female         Room/Bed: 4M11C/4M11C-01 Payor Info: Payor: MEDICARE / Plan: MEDICARE PART A AND B / Product Type: *No Product type* /    Admit Date/Time:  04/04/2019  3:32 PM  Primary Diagnosis:  Cervical arthritis with myelopathy  Patient Active Problem List   Diagnosis Date Noted  . HTN (hypertension) 04/05/2019  . Quadriplegia ([place]) 04/05/2019  . Atrial fibrillation ([place]) 04/05/2019  . Anticoagulated on Coumadin 04/05/2019  . 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  . Cervical myelopathy ([place]) 03/29/2019    Expected Discharge Date: Expected Discharge Date: 04/20/19  Team Members Present: Physician leading conference: Dr. Courtney Heys Social Worker Present: Lennart Pall, LCSW Nurse Present: Rayetta Pigg, RN Case Manager: Karene Fry, RN PT Present: Excell Seltzer, PT OT Present: Amy Rounds, OT SLP Present: Jettie Booze, CF-SLP PPS Coordinator present : Gunnar Fusi, SLP     Current Status/Progress Goal Weekly Team Focus  Bowel/Bladder   Pt continent of B/B per report, has had incontinent episode during shift. LBM 12/22.   improve in continence  assess toileting q shift and prn   Swallow/Nutrition/ Hydration             ADL's   Mod-max A sit>stand, min A stand pivot transfers with RW, max A LB bathing/dressing, Supervision UB bathing/dressing, set-up grooming from w/c level  Supervision overall, min A shower transfers  ADL re-training, functional transfers, neuro re-ed, functional standing balance/endurance, functional activity tolerance   Mobility   mod A bed mobility, mod A sit to stand, min A transfer with RW, gait up to 50 ft with min A and close w/c follow  Supervision to min A  PT eval   Communication             Safety/Cognition/ Behavioral Observations             Pain   pt c/o pain w/ activity when moving shoulders/neck area  pain less than 3  assess pain q shift and prn   Skin   posterior neck incision w/ skin glue, MASD on buttocks and under abdominal folds, ecchymosis on abdomen and BUE  prevent further skin breakdown  assess skin q shift and prn    Rehab Goals Patient on target to meet rehab goals: Yes *See Care Plan and progress notes for long and short-term goals.     Barriers to Discharge  Current Status/Progress Possible Resolutions Date Resolved   Nursing  Home environment access/layout;Incontinence;Neurogenic Bowel & Bladder;Weight;Wound Care               PT  Medical stability;Home environment access/layout;Decreased caregiver support                 OT                  SLP                SW                Discharge Planning/Teaching Needs:  New eval - per [facility], plan for home with spouse who can provide 24/7 assistance.  Teaching needs TBD   Team Discussion: Worsening weakness, had surgery 12/13, falls at home, readmitted 12/18, cord compression, hold coumadin until 12/25, hallucinations, changed flexiril, platelets  a little low, on decadron taper, hypokalemia better.  RN cont B/B, urgency, neck incision drsg, MASD groin and abd folds, using microgard powder, oxy for pain.  OT mod/max sit to stand, min A stand pivot transfers, husband helping with ADLs, S/min A goals.  PT mod bed, mod a sit to stand, min pivot, min a 50' walker.   Revisions to Treatment Plan: N/A     Medical Summary Current Status: continent B/B -urgency baseline; LBM o/n; skin good- posterior cervical  incision Weekly Focus/Goal: mod-max sit-stand; bath mod assist; UB/groom s/u; husband helped with ADLs at baseline; Goals Sup-min assist  Barriers to Discharge: Decreased family/caregiver support;Home enviroment access/layout;Medical stability;Incontinence;Weight;Wound care  Barriers to Discharge Comments: walked 50 ft RW- min assist; Possible  Resolutions to Barriers: RW- more therapy   Continued Need for Acute Rehabilitation Level of Care: The patient requires daily medical management by a physician with specialized training in physical medicine and rehabilitation for the following reasons: Direction of a multidisciplinary physical rehabilitation program to maximize functional independence : Yes Medical management of patient stability for increased activity during participation in an intensive rehabilitation regime.: Yes Analysis of laboratory values and/or radiology reports with any subsequent need for medication adjustment and/or medical intervention. : Yes   I attest that I was present, lead the team conference, and concur with the assessment and plan of the team.   Jodell Cipro M 04/05/2019, 9:01 PM  Team conference was held via web/ teleconference due to [place] - 19

## 2019-04-05 NOTE — Progress Notes (Signed)
Inpatient Rehabilitation  Patient information reviewed and entered into eRehab system by Pernell Dikes M. Orla Jolliff, M.A., CCC/SLP, PPS Coordinator.  Information including medical coding, functional ability and quality indicators will be reviewed and updated through discharge.    

## 2019-04-05 NOTE — Evaluation (Signed)
Occupational Therapy Assessment and Plan  Patient Details  Name: Natasha Lawson MRN: [id number] Date of Birth: 1941/01/01  OT Diagnosis: acute pain, lumbago (low back pain) and muscle weakness (generalized) Rehab Potential: Rehab Potential (ACUTE ONLY): Good ELOS: ~2- 2.5 weeks   Today's Date: 04/05/2019 OT Individual Time: 8676-7209 OT Individual Time Calculation (min): 75 min     Problem List:  Patient Active Problem List   Diagnosis Date Noted  . HTN (hypertension) 04/05/2019  . Quadriplegia ([place]) 04/05/2019  . Atrial fibrillation ([place]) 04/05/2019  . Anticoagulated on Coumadin 04/05/2019  . 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  . Cervical myelopathy ([place]) 03/29/2019    Past Medical History:  Past Medical History:  Diagnosis Date  . Anxiety   . Cancer [facility])    breast cancer right  . Dysrhythmia    A-Fib on coumadin  . GERD (gastroesophageal reflux disease)   . Gout   . 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 History of blood transfusion   . Hypertension   . Sleep apnea    uses CPAP nightly   Past Surgical History:  Past Surgical History:  Procedure Laterality Date  . ABDOMINAL HYSTERECTOMY    . BREAST SURGERY Right 08/14/2014   lumpectomy/lynph nodes  . BREAST SURGERY Right 10/05/2016   mass removed  . CARDIAC CATHETERIZATION    . CARPAL TUNNEL RELEASE Bilateral 10/01/11, 12/02/11  . CHOLECYSTECTOMY  1987  . COLONOSCOPY    . DILATION AND CURETTAGE OF UTERUS  10/10/2008   hysteroscopy with biopsy  . EYE SURGERY Bilateral    removed cataracts  . HYSTEROSCOPY WITH D & C  08/19/2011  . INCONTINENCE SURGERY  01/11/2007  . JOINT REPLACEMENT Right 10/18/2013  . laparoscopy bilateral salpingo-oophorectomy  01/27/2012  . NECK SURGERY  11/16/2017  . pci lad  01/02/2015   stent   . POSTERIOR CERVICAL FUSION/FORAMINOTOMY N/A 03/29/2019   Procedure: Cervical Five to Cervical Seven Posterior cervical laminectomy and instrumented fusion;  Surgeon: Judith Part, MD;  Location: [place];  Service: Neurosurgery;  Laterality: N/A;  Cervical Five to Cervical Seven Posterior cervical laminectomy and instrumented fusion  . radiation breast canceer Right    from 12/08/14-01/12/15  . rotator cuff  Right 04/26/2010   repair  . TONSILLECTOMY  1954  . TUBAL LIGATION  1984  . UPPER GI ENDOSCOPY  2016    Assessment & Plan Clinical Impression: Natasha Lawson is a 78 year old female with history of breast cancer, gut, HTN, OSA,  PAF (needed admission), attempts at ACDF (residual left sided weakness and neuropathy symptoms) in the past, gait disorder with falls who 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 She was discharged to home on 12/16 but has had problems managing with recurrent falls and difficulty walking. She was admitted on 12/18 with MRI Cervical spine revealing post op changes with small subdural and/or intradural collection extending form C5-C7 with mass effect on dorsal and right aspect with moderate compression of cervical spinal cord without signal changes--fluid collection extends form C7 to upper thoracic spinal cord.  She was started on IV decadron and chronic coumadin to be held thorough POD# 10. Therapy evaluations done revealing functional deficits and CIR recommended due to myelopathy.  Patient transferred to CIR on 04/04/2019 .    Patient currently requires max with basic self-care skills secondary to muscle weakness and muscle paralysis, decreased cardiorespiratoy endurance and decreased sitting balance, decreased  standing balance, decreased postural control and decreased balance strategies.  Prior to hospitalization, patient could complete ADLs with mod I- min.  Patient will benefit from skilled intervention to decrease level of assist with basic self-care skills and increase independence with basic self-care skills prior to discharge home with care partner.  Anticipate  patient will require 24 hour supervision and follow up home health.  OT - End of Session Activity Tolerance: Tolerates 10 - 20 min activity with multiple rests Endurance Deficit: Yes Endurance Deficit Description: Requires rest breaks throughout seated level ADLs OT Assessment Rehab Potential (ACUTE ONLY): Good OT Patient demonstrates impairments in the following area(s): Balance;Safety;Sensory;Skin Integrity;Endurance;Motor;Pain OT Basic ADL's Functional Problem(s): Grooming;Bathing;Dressing;Toileting OT Transfers Functional Problem(s): Toilet;Tub/Shower OT Additional Impairment(s): None OT Plan OT Intensity: Minimum of 1-2 x/day, 45 to 90 minutes OT Frequency: 5 out of 7 days OT Duration/Estimated Length of Stay: ~2- 2.5 weeks OT Treatment/Interventions: Balance/vestibular training;Discharge planning;Functional electrical stimulation;Pain management;Self Care/advanced ADL retraining;Therapeutic Activities;UE/LE Coordination activities;Therapeutic Exercise;Patient/family education;Functional mobility training;Disease mangement/prevention;Community reintegration;DME/adaptive equipment instruction;Psychosocial support;UE/LE Strength taining/ROM OT Self Feeding Anticipated Outcome(s): Indep OT Basic Self-Care Anticipated Outcome(s): Supervision OT Toileting Anticipated Outcome(s): Supervision OT Bathroom Transfers Anticipated Outcome(s): Supervision-min A OT Recommendation Recommendations for Other Services: Neuropsych consult;Therapeutic Recreation consult Therapeutic Recreation Interventions: Stress management Patient destination: Home Follow Up Recommendations: Home health OT Equipment Recommended: To be determined   Skilled Therapeutic Intervention Session One: Pt seen for OT eval and ADL bathing/dressing session. Pt awake in supine upon arrival, denied pain at rest and pain not a limiting factor during session. She transferred to sitting EOB with min-mod A using hospital bed  functions. Stand pivot transfer to w/c with min-mod A and VCs for sequencing and RW management.  Completed stand pivot transfer w/c> heavy duty BSC in shower with use of grab bar and max A to power into standing. SHe bathed seated on TTB with assist for LB bathing, mod A overall. Mod A stand pivot transfer to return to w/c.  She dressed seated in w/c. Total A to thread underwear. She stood at [facility] with mod A for dynamic standing balance in attempts to pull up underwear without UE support, assist to fully advance over hips. Total A to don non-slid socks. Grooming tasks completed from w/c level with set-up. Pt left seated in w/c at end of session, all needs in reach.  Education provided throughout session regarding role of OT, POC, OT/PT goals, continuum of care, DME and discharge planning.  Session Two: Pt seen for OT session focusing on ADL re-training and sit>stand in prep for functional tasks. Pt awake in supine upon arrival, agreeable to tx session and denying pain.  Transfers: Supine>sitting EOB with min A for reciprocal scooting to bring hips towards EOB and use of hospital bed functions. Sit>stand throughout session CGA-max A depending on surface height and pt's fatigue level. Completed x10 sit>stand from elevated mat in therapy gym without UE support for LE strengthening. Multi-modal cuing to facilitate anterior weight shift, wide BOS and controlled descent.  Mod A sit>supine to return to bed.  ADL re-training: Pt able to doff B non-slid socks with use of reacher. Applied lotion to B LEs due to dry skin. Therapist donned TEDs total A due to B LE edema with RN orders. Pt donned B non-slid socks with use of sock aid and supervision, able to recall technique for use of sock aid.   Pt returned to room at end of session requesting to return to supine, left with all needs in  reach and bed alarm on.    OT Evaluation Precautions/Restrictions  Precautions Precautions: Cervical Required Braces or  Orthoses: Cervical Brace Cervical Brace: For comfort;Soft collar Restrictions Weight Bearing Restrictions: No General Chart Reviewed: Yes Home Living/Prior Functioning Home Living Available Help at Discharge: Family, Available 24 hours/day Type of Home: House Home Access: Stairs to enter [employer] of Steps: 2 Entrance Stairs-Rails: None Home Layout: One level Bathroom Shower/Tub: Tub/shower unit(Uses TTB) Bathroom Toilet: Standard Bathroom Accessibility: Yes Additional Comments: Lives with husband who assisted with ADLs and completed all IADLs  Lives With: Spouse IADL History Homemaking Responsibilities: No Current License: No Occupation: Retired Prior Function Level of Independence: Needs assistance with ADLs, Requires assistive device for independence, Needs assistance with homemaking  Able to Take Stairs?: Yes Driving: No Vision Baseline Vision/History: Cataracts;Wears glasses(Reports cataracts R eye) Wears Glasses: Reading only Patient Visual Report: Blurring of vision(R eye blurry per pt report) Vision Assessment?: No apparent visual deficits Perception  Perception: Within Functional Limits Praxis Praxis: Intact Cognition Overall Cognitive Status: Within Functional Limits for tasks assessed Arousal/Alertness: Awake/alert Orientation Level: Person;Place;Situation Person: Oriented Place: Oriented Situation: Oriented Year: 2020 Month: December Day of Week: Correct Memory: Appears intact Immediate Memory Recall: Sock;Blue;Bed Memory Recall Sock: Without Cue Memory Recall Blue: Without Cue Memory Recall Bed: Without Cue Attention: Focused Focused Attention: Appears intact Awareness: Appears intact Problem Solving: Appears intact Safety/Judgment: Appears intact Sensation Sensation Light Touch: Impaired Detail Light Touch Impaired Details: Impaired RLE;Impaired LLE;Impaired RUE;Impaired LUE(Reports tingling throughout all 4 extremities in various  locations along extremity) Proprioception: Appears Intact Coordination Gross Motor Movements are Fluid and Coordinated: No Fine Motor Movements are Fluid and Coordinated: Yes Coordination and Movement Description: Impaired 2/2 pain and weakness Motor  Motor Motor: Other (comment) Motor - Skilled Clinical Observations: Generalized weakness and deconditioning Trunk/Postural Assessment  Cervical Assessment Cervical Assessment: Exceptions to WFL(Cervical pre-cautions) Thoracic Assessment Thoracic Assessment: Exceptions to WFL(Kyphotic; Rounded shoulders) Lumbar Assessment Lumbar Assessment: Exceptions to WFL(Posterior pelvic tilt) Postural Control Postural Control: Deficits on evaluation  Balance Balance Balance Assessed: Yes Static Sitting Balance Static Sitting - Balance Support: Feet supported;No upper extremity supported Static Sitting - Level of Assistance: 6: Modified independent (Device/Increase time) Static Sitting - Comment/# of Minutes: Sitting EOB Dynamic Sitting Balance Dynamic Sitting - Balance Support: Feet supported;During functional activity;No upper extremity supported Dynamic Sitting - Level of Assistance: 5: Stand by assistance Sitting balance - Comments: Sitting on TTB to complete bathing routine Static Standing Balance Static Standing - Balance Support: During functional activity;Bilateral upper extremity supported Static Standing - Level of Assistance: 4: Min assist Static Standing - Comment/# of Minutes: Standing with RW Dynamic Standing Balance Dynamic Standing - Balance Support: During functional activity;Right upper extremity supported;Left upper extremity supported Dynamic Standing - Level of Assistance: 4: Min assist;3: Mod assist Dynamic Standing - Comments: Standing to compete LB clothing management Extremity/Trunk Assessment RUE Assessment RUE Assessment: Within Functional Limits General Strength Comments: 4/5 throughout LUE Assessment LUE  Assessment: Exceptions to [facility] Active Range of Motion (AROM) Comments: ~100 degrees shoulder flexion; all other [facility] General Strength Comments: 4-/5 LUE AROM (degrees) Overall AROM Left Upper Extremity: Deficits     Refer to Care Plan for Long Term Goals  Recommendations for other services: Neuropsych and Therapeutic Recreation  Stress management   Discharge Criteria: Patient will be discharged from OT if patient refuses treatment 3 consecutive times without medical reason, if treatment goals not met, if there is a change in medical status, if patient makes no progress towards  goals or if patient is discharged from hospital.  The above assessment, treatment plan, treatment alternatives and goals were discussed and mutually agreed upon: by patient  Natasha Lawson L 04/05/2019, 12:35 PM

## 2019-04-06 ENCOUNTER — Inpatient Hospital Stay (HOSPITAL_COMMUNITY): Payer: PRIVATE HEALTH INSURANCE | Admitting: Physical Therapy

## 2019-04-06 ENCOUNTER — Inpatient Hospital Stay (HOSPITAL_COMMUNITY): Payer: PRIVATE HEALTH INSURANCE | Admitting: Occupational Therapy

## 2019-04-06 LAB — GLUCOSE, CAPILLARY
Glucose-Capillary: 135 mg/dL — ABNORMAL HIGH (ref 70–99)
Glucose-Capillary: 121 mg/dL — ABNORMAL HIGH (ref 70–99)
Glucose-Capillary: 113 mg/dL — ABNORMAL HIGH (ref 70–99)
Glucose-Capillary: 194 mg/dL — ABNORMAL HIGH (ref 70–99)

## 2019-04-06 NOTE — Progress Notes (Addendum)
ANTICOAGULATION CONSULT NOTE - Initial Consult  Pharmacy Consult for warfarin Indication: atrial fibrillation  Allergies  Allergen Reactions  . Capoten [Captopril] Swelling    Tongue   . Iodine Hives  . Norvasc [Amlodipine Besylate] Swelling  . Shellfish Allergy Hives  . Terazosin Hcl Other (See Comments)    Caused a-fib  . Xarelto [Rivaroxaban] Other (See Comments)    Severe back pain  . Welchol [Colesevelam Hcl] Other (See Comments)    Confusion   . Crestor [Rosuvastatin Calcium] Other (See Comments)    Muscle aches  . Fenofibrate Other (See Comments)    Muscle aches  . Pravachol [Pravastatin Sodium] Other (See Comments)    fatigue  . Spironolactone Diarrhea  . Statins Other (See Comments)    Joint pain    Patient Measurements: Height: 5\' 7"  (170.2 cm) Weight: 244 lb 14.9 oz (111.1 kg) IBW/kg (Calculated) : 61.6  Vital Signs: Temp: 98 F (36.7 C) (12/23 1929) Temp Source: Oral (12/23 1929) BP: 112/69 (12/23 1929) Pulse Rate: 58 (12/23 1929)  Labs: Recent Labs    04/05/19 0540  HGB 10.2*  HCT 31.4*  PLT 155  CREATININE 1.04*    Estimated Creatinine Clearance: 59.1 mL/min (A) (by C-G formula based on SCr of 1.04 mg/dL (H)).   Medical History: Past Medical History:  Diagnosis Date  . Anxiety   . Cancer [facility])    breast cancer right  . Dysrhythmia    A-Fib on coumadin  . GERD (gastroesophageal reflux disease)   . Gout   . 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 History of blood transfusion   . Hypertension   . Sleep apnea    uses CPAP nightly     Assessment: 4 yoF with AFib on warfarin PTA admitted with cervical laminectomy. Per neurology, pharmacy can resume warfarin on POD#10 (12/25).  Goal of Therapy:  INR 2-3 Monitor platelets by anticoagulation protocol: Yes   Plan:  -Will hold warfarin for now, resume 12/25 -Check INR   Arrie Senate, PharmD, BCPS Clinical Pharmacist [phone] Please check AMION for all [facility]  numbers 04/06/2019

## 2019-04-06 NOTE — Progress Notes (Signed)
Physical Therapy Session Note  Patient Details  Name: Natasha Lawson MRN: [id number] Date of Birth: [birth year]  Today's Date: 04/06/2019 PT Individual Time: 0800-0900; 1435-1530 PT Individual Time Calculation (min): 60 min and 55 min  Short Term Goals: Week 1:  PT Short Term Goal 1 (Week 1): Pt will complete bed mobility with min A PT Short Term Goal 2 (Week 1): Pt will ambulate x 100 ft with LRAD and assist x 1 PT Short Term Goal 3 (Week 1): Pt will initiate stair training  Skilled Therapeutic Interventions/Progress Updates:    Session 1: Pt received seated in bed, agreeable to PT session. No complaints of pain. Pt is dependent to don knee high TEDS and shoes at bed level. Supine to sit with min A. Stand pivot transfer bed to w/c with min A. Discussed pt's fall the previous evening and that she is not to be ambulating to the bathroom with RW but to be performing stand pivot transfers to the [facility] with RW with assist from staff. Manual w/c propulsion x 150 ft with use of BUE at Supervision level. Car transfer with mod A for BLE management in/out of car. Standing alt L/R 4" step taps with RW and mod A for balance. Standing mini-squats x 10 reps with RW and mod A for balance for BLE strengthening. Provided pt with B ELR for w/c for edema management. Pt left seated in w/c in room with needs in reach, quick release belt and chair alarm in place.  Session 2: Pt received seated in w/c in room, agreeable to PT session. Pt reports soreness in B shoulders and neck, premedicated prior to start of therapy session. Manual w/c propulsion x 150 ft with use of BUE at Supervision level for global endurance training. Stand pivot transfer w/c to/from mat table with RW and mod A due to pt fatigue this PM. Standing balance with one UE on RW and min A while performing horseshoe toss, 3 x 8 tosses. Pt demos good standing balance. Standing alt L/R marches and L/R forward/backward steps with RW and min A for balance.  Sidesteps L/R x 10 ft with RW and mod A for balance. Pt requests to return to bed at end of session. Sit to supine mod A for BLE management. Pt left semi-reclined in bed with BLE elevated, needs in reach, bed alarm in place.  Therapy Documentation Precautions:  Precautions Precautions: Cervical Required Braces or Orthoses: Cervical Brace Cervical Brace: For comfort, Soft collar Restrictions Weight Bearing Restrictions: No    Therapy/Group: Individual Therapy   Excell Seltzer, PT, DPT  04/06/2019, 12:42 PM

## 2019-04-06 NOTE — Progress Notes (Signed)
[place] PHYSICAL MEDICINE & REHABILITATION PROGRESS NOTE   Subjective/Complaints:  Pt reports hallucinations have completely stopped Pain better and spasms aren't quite as bad- doesn't think asked for skelaxin yet.  Legs gave out when "fell" yesterday- was guided to floor.  ROS- pt denies SOB, CP, Abd pain, HA, Constipation, N/V/D Objective:   No results found. Recent Labs    04/05/19 0540  WBC 5.3  HGB 10.2*  HCT 31.4*  PLT 155   Recent Labs    04/05/19 0540  NA 145  K 3.9  CL 107  CO2 26  GLUCOSE 166*  BUN 41*  CREATININE 1.04*  CALCIUM 8.6*    Intake/Output Summary (Last 24 hours) at 04/06/2019 1634 Last data filed at 04/06/2019 1535 Gross per 24 hour  Intake 1055 ml  Output -  Net 1055 ml     Physical Exam: Vital Signs Blood pressure (!) 157/66, pulse 63, temperature 97.8 F (36.6 C), temperature source Oral, resp. rate 20, height 5\' 7"  (1.702 m), weight 111.1 kg, SpO2 98 %.  Physical Exam  Nursing note and vitals reviewed. General: Alert and oriented x 3, No apparent distress. Obese,  Sitting up in bed, NAD HEENT: Head is normocephalic, atraumatic, PERRLA, EOMI,  Neck: tight trigger points in upper traps/levators/scalenes, and rhomboids Heart: irregular; rate controlled No murmurs rubs or gallops Chest: CTA bilaterally without wheezes, rales, or rhonchi; no distress Abdomen: Soft, non-tender, non-distended, bowel sounds positive. Extremities: No clubbing, cyanosis, or edema. Pulses are 2+ Skin: Posterior cervical spine incision with mild erythema but C/D/I  Neuro: She is alert and oriented to person, place, and time.  Has diffuse patchy numbness present in all extremities.  Musculoskeletal: 1+ pedal edema.  Well healed right TKR. 4-/5 strength on LUE and 4/5 strength in other extremities.  Psych: Pt's affect is appropriate. Pt is cooperative     Assessment/Plan: 1. 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  Physiatrist is providing close team supervision and 24 hour management of active medical problems listed below.  Physiatrist and rehab team continue to assess barriers to discharge/monitor patient progress toward functional and medical goals  Care Tool:  Bathing    Body parts bathed by patient: Right arm, Left arm, Abdomen, Chest, Right upper leg, Left upper leg, Face   Body parts bathed by helper: Front perineal area, Buttocks, Right lower leg, Left lower leg     Bathing assist Assist Level: Moderate Assistance - Patient 50 - 74%     Upper Body Dressing/Undressing Upper body dressing   What is the patient wearing?: Dress    Upper body assist Assist Level: Minimal Assistance - Patient > 75%    Lower Body Dressing/Undressing Lower body dressing      What is the patient wearing?: Pants, Underwear/pull up, Incontinence brief     Lower body assist Assist for lower body dressing: Moderate Assistance - Patient 50 - 74%     Toileting Toileting    Toileting assist Assist for toileting: Minimal Assistance - Patient > 75%     Transfers Chair/bed transfer  Transfers assist     Chair/bed transfer assist level: Minimal Assistance - Patient > 75%     Locomotion Ambulation   Ambulation assist      Assist level: 2 helpers Assistive device: Walker-rolling Max distance: 50'   Walk 10 feet activity   Assist  Assist level: 2 helpers Assistive device: Walker-rolling   Walk 50 feet activity   Assist    Assist level: 2 helpers Assistive device: Walker-rolling    Walk 150 feet activity   Assist Walk 150 feet activity did not occur: Safety/medical concerns         Walk 10 feet on uneven surface  activity   Assist Walk 10 feet on uneven surfaces activity did not occur: Safety/medical concerns          Wheelchair     Assist Will patient use wheelchair at discharge?: No Type of Wheelchair: Manual    Wheelchair assist level: Supervision/Verbal cueing Max wheelchair distance: 150'    Wheelchair 50 feet with 2 turns activity    Assist        Assist Level: Supervision/Verbal cueing   Wheelchair 150 feet activity     Assist      Assist Level: Supervision/Verbal cueing   Blood pressure (!) 157/66, pulse 63, temperature 97.8 F (36.6 C), temperature source Oral, resp. rate 20, height 5\' 7"  (1.702 m), weight 111.1 kg, SpO2 98 %.  Medical Problem List and Plan: 1.  Impaired mobility and ADLs secondary to cervical myelopathy             -patient may shower, but spinal incision should be covered             -ELOS/Goals: ModI in PT, OT, I in SLP 2.  Antithrombotics: -DVT/anticoagulation:  Pharmaceutical: Heparin             -antiplatelet therapy: ASA             -chronic coumadin is being held until post-op day #10  12/22- restarted 12/20 per pt, but not on list- needs to restart 12/25! 3. Pain Management:  Oxycodone prn  12/22- will stop Flexeril since likely causing hallucinations and start Skelaxin 800 mg TID prn for muscle spasms/tightness 4. Mood: LCSW to follow for evaluation and support.              -antipsychotic agents: NA 5. Neuropsych: This patient is capable of making decisions on her own behalf. 6. Skin/Wound Care: routine pressure relief measures.  7. Fluids/Electrolytes/Nutrition: Monitor I/O. Check lytes in am.  8.  Subdural/intradural collection with cord compression: On IV decadron 10 mg every 4 hours- per NSU, no need for additional surgery. 9. Hyperglycemia: has h/o diet controlled DM in the past. Will monitor BS ac/hs as now on steroids.  10.  OSA: continue CPAP at nights.  12/22- pt refusing- said wants 2L O2 at night.   11. PAF: On Bystolic bid.  Monitor HR TID--continue to hold coumadin thorough  12. H/o iron deficiency anemia: 13. H/o  Anxiety disorder: Has been stable on Zoloft.  14. Hypokalemia: Was on Kdur 10 meq bid but has had progressive drop 3.2-->2.9. Will increase to 40 meq bid for today and recheck labs in am. Adjust as needed.   12/22- K+ 3.9- well controlled 15. Thrombocytopenia: Recheck in am.   12/22- plts 155k 16. HTN/Peripheral edema: On Demadex daily  17. Atrial fibrillation: Coumadin will be restarted on post-op day #10. which is 12/25      LOS: 2 days A FACE TO FACE EVALUATION WAS PERFORMED  Roald Lukacs 04/06/2019, 4:34 PM

## 2019-04-06 NOTE — Progress Notes (Signed)
Patients husband will be going for surgery to his knee soon, therefore patient requested we change designated visitor to her daughter since he will not be able to make it up here for the duration of her stay.  Given the circumstances, I will change the designated visitor to her daughter Catalina Lunger.  Brita Romp, RN

## 2019-04-06 NOTE — Significant Event (Signed)
Patient noted transferring with cna and reports that legs are "giving out". Pt lowered to floor onto buttocks in order to avoid pt falling. No injuries noted, no pain noted. CNA instructed not to transfer patient without assist and to utilize bedside commode for toileting.

## 2019-04-06 NOTE — Plan of Care (Signed)
  Problem: Consults Goal: RH SPINAL CORD INJURY PATIENT EDUCATION Description:  See Patient Education module for education specifics.  Outcome: Progressing Goal: Skin Care Protocol Initiated - if Braden Score 18 or less Description: If consults are not indicated, leave blank or document N/A Outcome: Progressing   Problem: SCI BOWEL ELIMINATION Goal: RH STG MANAGE BOWEL WITH ASSISTANCE Description: STG Manage Bowel with min Assistance. Outcome: Progressing   Problem: SCI BLADDER ELIMINATION Goal: RH STG MANAGE BLADDER WITH ASSISTANCE Description: STG Manage Bladder With min Assistance Outcome: Progressing   Problem: RH SAFETY Goal: RH STG ADHERE TO SAFETY PRECAUTIONS W/ASSISTANCE/DEVICE Description: STG Adhere to Safety Precautions With mod I Assistance/Device. Outcome: Progressing Goal: RH STG DECREASED RISK OF FALL WITH ASSISTANCE Description: STG Decreased Risk of Fall With mod I Assistance. Outcome: Progressing   Problem: RH SKIN INTEGRITY Goal: RH STG SKIN FREE OF INFECTION/BREAKDOWN Description: Patients skin will remain free from further infection or breakdown with mod I assist. Outcome: Progressing Goal: RH STG MAINTAIN SKIN INTEGRITY WITH ASSISTANCE Description: STG Maintain Skin Integrity With mod I Assistance. Outcome: Progressing   Problem: RH PAIN MANAGEMENT Goal: RH STG PAIN MANAGED AT OR BELOW PT'S PAIN GOAL Description: < 4 Outcome: Progressing

## 2019-04-06 NOTE — Progress Notes (Signed)
Occupational Therapy Session Note  Patient Details  Name: Natasha Lawson MRN: [id number] Date of Birth: 1940-06-10  Today's Date: 04/06/2019 OT Individual Time: [id number] OT Individual Time Calculation (min): 75 min    Short Term Goals: Week 1:  OT Short Term Goal 1 (Week 1): Pt will complete sit>stand as part of dressing task with min A OT Short Term Goal 2 (Week 1): Pt will stand to complete 1 grooming task with CGA in order to increase functional standing balance/endurance OT Short Term Goal 3 (Week 1): Pt will complete LB dressing with min A using AE PRN OT Short Term Goal 4 (Week 1): Pt will complete toileting task with mod A  Skilled Therapeutic Interventions/Progress Updates:    Pt seen for OT session focusing on functional transfers, dynamic standing balance/endurance and functional use of UEs.  Pt sitting up in w/c upon arrival completing grooming tasks at sink and denying pain. She self-propelled w/c ~41ft with supervision for UE strengthening and general activity tolerance.  In ADL apartment, completed simulated tub/shower transfer utilizing tub transfer bench as she at home. Completed with mod A for management of B LEs over tub wall. Sit>stand with mod A from w/c to RW.  In therapy gym, completed dynamic standing at high/low table, standing with RW to reach across midline to obtain items, completed with min-mod A  For dynamic standing balance.  From w/c level, completed fine motor Christmas craft using B UEs for placing, twisting and squeezing items. Also completed coloring task for hand and UE control needed for witting. Pt voiced enjoying and relaxing during therapeutic activity. Pt returned to room at end of session and requesting toileting task. Min A stand pivot transfer to [facility] with use of RW. Min A for clothing management. Pt left seated on BSC with call bell in hand. RN made aware of pt's position.   Therapy Documentation Precautions:  Precautions Precautions:  Cervical Required Braces or Orthoses: Cervical Brace Cervical Brace: For comfort, Soft collar Restrictions Weight Bearing Restrictions: No   Therapy/Group: Individual Therapy  Zebulon Gantt L 04/06/2019, 7:00 AM

## 2019-04-07 ENCOUNTER — Inpatient Hospital Stay (HOSPITAL_COMMUNITY): Payer: PRIVATE HEALTH INSURANCE

## 2019-04-07 ENCOUNTER — Inpatient Hospital Stay (HOSPITAL_COMMUNITY): Payer: PRIVATE HEALTH INSURANCE | Admitting: Occupational Therapy

## 2019-04-07 ENCOUNTER — Inpatient Hospital Stay (HOSPITAL_COMMUNITY): Payer: PRIVATE HEALTH INSURANCE | Admitting: Physical Therapy

## 2019-04-07 LAB — PROTIME-INR
INR: 1.1 (ref 0.8–1.2)
Prothrombin Time: 14.3 seconds (ref 11.4–15.2)

## 2019-04-07 LAB — GLUCOSE, CAPILLARY
Glucose-Capillary: 136 mg/dL — ABNORMAL HIGH (ref 70–99)
Glucose-Capillary: 97 mg/dL (ref 70–99)
Glucose-Capillary: 118 mg/dL — ABNORMAL HIGH (ref 70–99)
Glucose-Capillary: 209 mg/dL — ABNORMAL HIGH (ref 70–99)

## 2019-04-07 NOTE — Progress Notes (Signed)
Occupational Therapy Session Note  Patient Details  Name: Natasha Lawson MRN: [id number] Date of Birth: 04/19/1940  Today's Date: 04/07/2019 OT Individual Time: 1450-1520 OT Individual Time Calculation (min): 30 min    Short Term Goals: Week 1:  OT Short Term Goal 1 (Week 1): Pt will complete sit>stand as part of dressing task with min A OT Short Term Goal 2 (Week 1): Pt will stand to complete 1 grooming task with CGA in order to increase functional standing balance/endurance OT Short Term Goal 3 (Week 1): Pt will complete LB dressing with min A using AE PRN OT Short Term Goal 4 (Week 1): Pt will complete toileting task with mod A  Skilled Therapeutic Interventions/Progress Updates:    1:1. Pt received in w/c agreeable to OT with no pain reported. Pt completes w/c propulsion to/from tx gym for BUE strengthening required for BADls/IADLs. Pt completes standing connect 4 game with no UE support and CGA for standing balance after MIN A provided for power up. 2 posterior LOB noted with standing task with MIN A to recover. Pt returned to room, call light in reach and all needs met.  Therapy Documentation Precautions:  Precautions Precautions: Cervical Required Braces or Orthoses: Cervical Brace Cervical Brace: For comfort, Soft collar Restrictions Weight Bearing Restrictions: No General:   Vital Signs: Therapy Vitals Temp: (!) 97.5 F (36.4 C) Temp Source: Oral Pulse Rate: (!) 53 Resp: 20 BP: (!) 143/51 Patient Position (if appropriate): Sitting Oxygen Therapy SpO2: 98 % O2 Device: Room Air Pain:   ADL:   Vision   Perception    Praxis   Exercises:   Other Treatments:     Therapy/Group: Individual Therapy  Tonny Branch 04/07/2019, 3:31 PM

## 2019-04-07 NOTE — Progress Notes (Signed)
Physical Therapy Session Note  Patient Details  Name: Natasha Lawson MRN: [id number] Date of Birth: 07-13-1940  Today's Date: 04/07/2019 PT Individual Time: 1000-1100 PT Individual Time Calculation (min): 60 min   Short Term Goals: Week 1:  PT Short Term Goal 1 (Week 1): Pt will complete bed mobility with min A PT Short Term Goal 2 (Week 1): Pt will ambulate x 100 ft with LRAD and assist x 1 PT Short Term Goal 3 (Week 1): Pt will initiate stair training  Skilled Therapeutic Interventions/Progress Updates:    Pt received seated in bed, agreeable to PT session. No complaints of pain. Supine to sit with Supervision with HOB slightly elevated and use of bedrail. Sit to stand with mod A to RW. Stand pivot transfer bed to w/c with RW and mod A. Pt reports increase in fatigue this date. Manual w/c propulsion x 150 ft with use of BUE at Supervision level. Ambulation x 30 ft with RW and min A while bringing w/c close behind for safety. Pt exhibits decreased B step length, decreased BLE clearance, and significantly decreased gait speed during ambulation. Alt L/R 3" step taps with BUE support and min A for balance. Pt progresses to ascending one 3" step with RLE first with B handrails and mod A for balance, x 10 reps. Pt reports BLE feel "shaky" but has no instances of LE buckling on steps. Pt does report decreased BLE proprioception when taking steps. Standing alt L/R target taps with RW and min A for balance for BLE coordination training. Pt requests to return to bed at end of session. Stand pivot transfer w/c to bed with mod A and RW. Sit to supine mod A for BLE management. Pt left semi-reclined in bed with needs in reach at end of session.  Therapy Documentation Precautions:  Precautions Precautions: Cervical Required Braces or Orthoses: Cervical Brace Cervical Brace: For comfort, Soft collar Restrictions Weight Bearing Restrictions: No    Therapy/Group: Individual Therapy   Excell Seltzer, PT,  DPT  04/07/2019, 12:46 PM

## 2019-04-07 NOTE — Progress Notes (Signed)
Occupational Therapy Session Note  Patient Details  Name: Quentella Mcguffie MRN: [id number] Date of Birth: 10/24/1940  Today's Date: 04/07/2019 OT Individual Time: 1115-1138 OT Individual Time Calculation (min): 23 min    Short Term Goals: Week 1:  OT Short Term Goal 1 (Week 1): Pt will complete sit>stand as part of dressing task with min A OT Short Term Goal 2 (Week 1): Pt will stand to complete 1 grooming task with CGA in order to increase functional standing balance/endurance OT Short Term Goal 3 (Week 1): Pt will complete LB dressing with min A using AE PRN OT Short Term Goal 4 (Week 1): Pt will complete toileting task with mod A  Skilled Therapeutic Interventions/Progress Updates:    1;1. Pt received in bed agreeable to OT with no pain reported just, "wobbly" legs today. Pt agreeable to attempt toileting with toilet tongs. OT demo use and how to apply toilet paper/wash cloth. Pt needed to void urine and completes stand pivot transfer to [facility] in room to R with MIN A for transfer and CM for steadying. Pt able to wife front peri area after urine void and simulates toilet tong use with wash cloth. Pt reports with proctice she will likely be able to fully reach buttocks for cleansing. Pt returns back to bed in same manner above and requires A to pull LLE into bed. Exited session with husband present in room,  Pt in bed, exit alarm on and call light in reach.  Therapy Documentation Precautions:  Precautions Precautions: Cervical Required Braces or Orthoses: Cervical Brace Cervical Brace: For comfort, Soft collar Restrictions Weight Bearing Restrictions: No General:   Vital Signs:  Pain: Pain Assessment Pain Scale: 0-10 Pain Score: 0-No pain ADL:   Vision   Perception    Praxis   Exercises:   Other Treatments:     Therapy/Group: Individual Therapy  Tonny Branch 04/07/2019, 11:40 AM

## 2019-04-07 NOTE — Progress Notes (Signed)
[place] PHYSICAL MEDICINE & REHABILITATION PROGRESS NOTE   Subjective/Complaints: Pt reports hallucinations have completely stopped Pain better and spasms aren't quite as bad- doesn't think asked for skelaxin yet. Moving bowels regularly.  Would like me to review medications to see if she is taking any she doesn't need.   ROS- pt denies SOB, CP, Abd pain, HA, Constipation, N/V/D Objective:   No results found. Recent Labs    04/05/19 0540  WBC 5.3  HGB 10.2*  HCT 31.4*  PLT 155   Recent Labs    04/05/19 0540  NA 145  K 3.9  CL 107  CO2 26  GLUCOSE 166*  BUN 41*  CREATININE 1.04*  CALCIUM 8.6*    Intake/Output Summary (Last 24 hours) at 04/07/2019 1012 Last data filed at 04/07/2019 0758 Gross per 24 hour  Intake 920 ml  Output --  Net 920 ml     Physical Exam: Vital Signs Blood pressure (!) 151/76, pulse (!) 58, temperature 97.6 F (36.4 C), temperature source Oral, resp. rate 18, height 5\' 7"  (1.702 m), weight 111.1 kg, SpO2 98 %.  Physical Exam  Nursing note and vitals reviewed. General: Alert and oriented x 3, No apparent distress. Obese,  Sitting up in bed, NAD HEENT: Head is normocephalic, atraumatic, PERRLA, EOMI,  Neck: tight trigger points in upper traps/levators/scalenes, and rhomboids Heart: irregular; rate controlled No murmurs rubs or gallops Chest: CTA bilaterally without wheezes, rales, or rhonchi; no distress Abdomen: Soft, non-tender, non-distended, bowel sounds positive. Extremities: No clubbing, cyanosis, or edema. Pulses are 2+ Skin: Posterior cervical spine incision with mild erythema but C/D/I  Neuro: She is alert and oriented to person, place, and time.  Has diffuse patchy numbness present in all extremities.  Musculoskeletal: 1+ pedal edema.  Well healed right TKR. 4/5 strength on LUE and 4/5 strength in other extremities.  Psych: Pt's affect is appropriate. Pt is cooperative     Assessment/Plan: 1. 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  Physiatrist is providing close team supervision and 24 hour management of active medical problems listed below.  Physiatrist and rehab team continue to assess barriers to discharge/monitor patient progress toward functional and medical goals  Care Tool:  Bathing    Body parts bathed by patient: Right arm, Left arm, Abdomen, Chest, Right upper leg, Left upper leg, Face, Front perineal area, Buttocks, Right lower leg, Left lower leg   Body parts bathed by helper: Front perineal area, Buttocks, Right lower leg, Left lower leg     Bathing assist Assist Level: Contact Guard/Touching assist(LH sponge)     Upper Body Dressing/Undressing Upper body dressing   What is the patient wearing?: Pull over shirt    Upper body assist Assist Level: Set up assist    Lower Body Dressing/Undressing Lower body dressing      What is the patient wearing?: Pants, Underwear/pull up     Lower body assist Assist for lower body dressing: Minimal Assistance - Patient > 75%     Toileting Toileting    Toileting assist Assist for toileting: Moderate Assistance - Patient 50 - 74%     Transfers Chair/bed transfer  Transfers assist     Chair/bed transfer assist level: Moderate Assistance - Patient 50 - 74%     Locomotion Ambulation   Ambulation assist      Assist level: 2 helpers Assistive device:  Walker-rolling Max distance: 50'   Walk 10 feet activity   Assist     Assist level: 2 helpers Assistive device: Walker-rolling   Walk 50 feet activity   Assist    Assist level: 2 helpers Assistive device: Walker-rolling    Walk 150 feet activity   Assist Walk 150 feet activity did not occur: Safety/medical concerns         Walk 10 feet on uneven surface  activity   Assist  Walk 10 feet on uneven surfaces activity did not occur: Safety/medical concerns         Wheelchair     Assist Will patient use wheelchair at discharge?: No Type of Wheelchair: Manual    Wheelchair assist level: Supervision/Verbal cueing Max wheelchair distance: 150'    Wheelchair 50 feet with 2 turns activity    Assist        Assist Level: Supervision/Verbal cueing   Wheelchair 150 feet activity     Assist      Assist Level: Supervision/Verbal cueing   Blood pressure (!) 151/76, pulse (!) 58, temperature 97.6 F (36.4 C), temperature source Oral, resp. rate 18, height 5\' 7"  (1.702 m), weight 111.1 kg, SpO2 98 %.  Medical Problem List and Plan: 1.  Impaired mobility and ADLs secondary to cervical myelopathy             -patient may shower, but spinal incision should be covered             -ELOS/Goals: ModI in PT, OT, I in SLP  -Visitor is daughter, Catalina Lunger.  2.  Antithrombotics: -DVT/anticoagulation:  Pharmaceutical: Heparin             -antiplatelet therapy: ASA             -chronic coumadin is being held until post-op day #10  12/22- restarted 12/20 per pt, but not on list- needs to restart 12/25! 3. Pain Management:  Oxycodone prn  12/22- will stop Flexeril since likely causing hallucinations and start Skelaxin 800 mg TID prn for muscle spasms/tightness  12/23: Using Skelaxin and Oxycodone prn for pain.  4. Mood: LCSW to follow for evaluation and support.              -antipsychotic agents: NA 5. Neuropsych: This patient is capable of making decisions on her own behalf. 6. Skin/Wound Care: routine pressure relief measures.  7. Fluids/Electrolytes/Nutrition: Monitor I/O. Check lytes in am.  8.  Subdural/intradural collection with cord compression: On IV decadron 10 mg every 4 hours- per NSU, no need for additional surgery. 9. Hyperglycemia: has h/o diet controlled DM in the past. Will monitor BS ac/hs as now on steroids.  10.  OSA: continue CPAP at  nights.  12/22- pt refusing- said wants 2L O2 at night.   11. PAF: On Bystolic bid.  Monitor HR TID--continue to hold coumadin.  12. H/o iron deficiency anemia: 13. H/o Anxiety disorder: Has been stable on Zoloft.  14. Hypokalemia: Was on Kdur 10 meq bid but has had progressive drop 3.2-->2.9. Will increase to 40 meq bid for today and recheck labs in am. Adjust as needed.   12/22- K+ 3.9- well controlled  12/24: Will repeat BMP tomorrow to check K+.  15. Thrombocytopenia: Recheck in am.   12/22- plts 155k 16. HTN/Peripheral edema: On Demadex daily  17. Atrial fibrillation: Coumadin will be restarted on post-op day #10. which is 12/25      LOS: 3 days A FACE TO FACE EVALUATION WAS PERFORMED  Dustine Bertini P Adreanne Yono 04/07/2019, 10:12 AM

## 2019-04-07 NOTE — Progress Notes (Signed)
Occupational Therapy Session Note  Patient Details  Name: Natasha Lawson MRN: [id number] Date of Birth: 30-Jan-1941  Today's Date: 04/07/2019 OT Individual Time: 0800-0900 and 1345-1415 OT Individual Time Calculation (min): 60 min and 30 min   Short Term Goals: Week 1:  OT Short Term Goal 1 (Week 1): Pt will complete sit>stand as part of dressing task with min A OT Short Term Goal 2 (Week 1): Pt will stand to complete 1 grooming task with CGA in order to increase functional standing balance/endurance OT Short Term Goal 3 (Week 1): Pt will complete LB dressing with min A using AE PRN OT Short Term Goal 4 (Week 1): Pt will complete toileting task with mod A  Skilled Therapeutic Interventions/Progress Updates:    Session One: Pt seen for OT ADL Bathing/dressing session. Pt sitting up on BSC upon arrival, ready to get off. Denied pain this AM  ADL Re-training:Completed toileting task with steadying assist for clothing management and pericare, assist for buttock hygiene following BM. Grooming tasks completed from w/c level at sink mod I. Bathed seated on TTB with use of LH sponge to complete LB bathing and pericare/buttock with close supervision during modified stand to complete task. Overall close supervision for bathing. She returned to w/c to dress. Used dressing stick to assist with threadingunderwear/pants and stood with steadying assist at RW to pull pants up with assist to fully advance tight fitting underwear over hips. Assist for slide on shoes and TED hose due to LE edema. Indep UB dressing. Grooming tasks completed from w/c level mod I Transfers/mobility: Min A stand pivot transfer BSC>w/c with RW. Min A stand pivot to TTB with RW and use of grab bars. Mod A to stand from TTB and transition back to w/c Pt left seated in w/c at end of session with RN present, all needs in reach.   Session Two: Pt seen for OT session focusing on ADL re-training and functional standing balance/endurance. Pt  sitting up in bed upon arrival with husband present, agreeable to tx session and denying pain. Sit>stand throughout session with overall min A. Several trials from w/c level required increased time/trials to achieve full upright standing with steadying assist and VCs for hand placement on RW. Once standing, min A overall for balance She stood at sink to complete oral care, w/c placed behind pt for safety and completed with CGA. Following seated rest break, she stood again to brush hair, alternating UE use to incorporate B UE ROM. She ambulated into bathroom and completed toileting task with steadying assist with use of heavy duty BSC. She returned to w/c at end of session, left seated in w/c with all needs in reach.    Therapy Documentation Precautions:  Precautions Precautions: Cervical Required Braces or Orthoses: Cervical Brace Cervical Brace: For comfort, Soft collar Restrictions Weight Bearing Restrictions: No   Therapy/Group: Individual Therapy  Ceanna Wareing L 04/07/2019, 6:29 AM

## 2019-04-08 LAB — BASIC METABOLIC PANEL
Anion gap: 8 (ref 5–15)
BUN: 37 mg/dL — ABNORMAL HIGH (ref 8–23)
CO2: 26 mmol/L (ref 22–32)
Calcium: 8.3 mg/dL — ABNORMAL LOW (ref 8.9–10.3)
Chloride: 110 mmol/L (ref 98–111)
Creatinine, Ser: 0.91 mg/dL (ref 0.44–1.00)
GFR calc Af Amer: >60 mL/min (ref >60)
GFR calc non Af Amer: >60 mL/min (ref >60)
Glucose, Bld: 183 mg/dL — ABNORMAL HIGH (ref 70–99)
Potassium: 3.9 mmol/L (ref 3.5–5.1)
Sodium: 144 mmol/L (ref 135–145)

## 2019-04-08 LAB — PROTIME-INR
INR: 1.1 (ref 0.8–1.2)
Prothrombin Time: 14.4 seconds (ref 11.4–15.2)

## 2019-04-08 LAB — GLUCOSE, CAPILLARY
Glucose-Capillary: 143 mg/dL — ABNORMAL HIGH (ref 70–99)
Glucose-Capillary: 169 mg/dL — ABNORMAL HIGH (ref 70–99)
Glucose-Capillary: 188 mg/dL — ABNORMAL HIGH (ref 70–99)
Glucose-Capillary: 165 mg/dL — ABNORMAL HIGH (ref 70–99)

## 2019-04-08 MED ORDER — WARFARIN SODIUM 3 MG PO TABS: 3.0000 mg | ORAL_TABLET | ORAL | Status: DC

## 2019-04-08 MED ORDER — WARFARIN SODIUM 5 MG PO TABS
5.0000 mg | ORAL_TABLET | Freq: Once | ORAL | Status: AC
  Administered 2019-04-08: 5 mg via ORAL
  Filled 2019-04-08: qty 1

## 2019-04-08 MED ORDER — WARFARIN - PHARMACIST DOSING INPATIENT: Freq: Every day | Status: DC

## 2019-04-08 NOTE — Progress Notes (Signed)
ANTICOAGULATION CONSULT NOTE - Initial Consult  Pharmacy Consult for warfarin Indication: atrial fibrillation  Allergies  Allergen Reactions  . Capoten [Captopril] Swelling    Tongue   . Iodine Hives  . Norvasc [Amlodipine Besylate] Swelling  . Shellfish Allergy Hives  . Terazosin Hcl Other (See Comments)    Caused a-fib  . Xarelto [Rivaroxaban] Other (See Comments)    Severe back pain  . Welchol [Colesevelam Hcl] Other (See Comments)    Confusion   . Crestor [Rosuvastatin Calcium] Other (See Comments)    Muscle aches  . Fenofibrate Other (See Comments)    Muscle aches  . Pravachol [Pravastatin Sodium] Other (See Comments)    fatigue  . Spironolactone Diarrhea  . Statins Other (See Comments)    Joint pain    Patient Measurements: Height: 5\' 7"  (170.2 cm) Weight: 244 lb 14.9 oz (111.1 kg) IBW/kg (Calculated) : 61.6  Vital Signs: Temp: 98.7 F (37.1 C) (12/25 1409) Temp Source: Oral (12/25 1409) BP: 136/56 (12/25 1409) Pulse Rate: 57 (12/25 1409)  Labs: Recent Labs    04/07/19 0508 04/08/19 0330  LABPROT 14.3 14.4  INR 1.1 1.1  CREATININE  --  0.91    Estimated Creatinine Clearance: 67.6 mL/min (by C-G formula based on SCr of 0.91 mg/dL).   Medical History: Past Medical History:  Diagnosis Date  . Anxiety   . Cancer [facility])    breast cancer right  . Dysrhythmia    A-Fib on coumadin  . GERD (gastroesophageal reflux disease)   . Gout   . 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 History of blood transfusion   . Hypertension   . Sleep apnea    uses CPAP nightly     Assessment: 59 yoF with AFib on warfarin PTA admitted with cervical laminectomy. Per neurology, pharmacy can resume warfarin on POD#10 (12/25). Nursing spoke with patient and she states PTA she was taking Warfarin 2 mg PO daily.    Goal of Therapy:  INR 2-3 Monitor platelets by anticoagulation protocol: Yes   Plan:  -Will give Warfarin 5 mg PO x 1 dose tonight  -Daily  Pt-INR -Monitor patient for s/s of bleeding   Duanne Limerick, PharmD, BCPS Clinical Pharmacist [phone] Please check AMION for all [facility] [place] [facility] numbers 04/08/2019

## 2019-04-08 NOTE — Progress Notes (Signed)
[place] PHYSICAL MEDICINE & REHABILITATION PROGRESS NOTE   Subjective/Complaints: Pt reports hallucinations have completely stopped Pain better and spasms aren't quite as bad. Moving bowels regularly.  Today we will restart her Coumadin.   ROS- pt denies SOB, CP, Abd pain, HA, Constipation, N/V/D Objective:   No results found. No results for input(s): WBC, HGB, HCT, PLT in the last 72 hours. Recent Labs    04/08/19 0330  NA 144  K 3.9  CL 110  CO2 26  GLUCOSE 183*  BUN 37*  CREATININE 0.91  CALCIUM 8.3*    Intake/Output Summary (Last 24 hours) at 04/08/2019 0825 Last data filed at 04/08/2019 0600 Gross per 24 hour  Intake 740 ml  Output --  Net 740 ml     Physical Exam: Vital Signs Blood pressure (!) 164/58, pulse (!) 55, temperature (!) 97.3 F (36.3 C), resp. rate 19, height 5\' 7"  (1.702 m), weight 111.1 kg, SpO2 97 %.  Physical Exam  Nursing note and vitals reviewed. General: Alert and oriented x 3, No apparent distress. Obese,  Sitting up in bed, NAD HEENT: Head is normocephalic, atraumatic, PERRLA, EOMI,  Neck: tight trigger points in upper traps/levators/scalenes, and rhomboids Heart: irregular; rate controlled No murmurs rubs or gallops Chest: CTA bilaterally without wheezes, rales, or rhonchi; no distress Abdomen: Soft, non-tender, non-distended, bowel sounds positive. Extremities: No clubbing, cyanosis, or edema. Pulses are 2+ Skin: Posterior cervical spine incision with mild erythema but C/D/I  Neuro: She is alert and oriented to person, place, and time.  Has diffuse patchy numbness present in all extremities.  Musculoskeletal: 1+ pedal edema.  Well healed right TKR. 4/5 strength on LUE and 4+/5 strength in other extremities.  Psych: Pt's affect is appropriate. Pt is cooperative     Assessment/Plan: 1. 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  Physiatrist is providing close team supervision and 24 hour management of active medical problems listed below.  Physiatrist and rehab team continue to assess barriers to discharge/monitor patient progress toward functional and medical goals  Care Tool:  Bathing    Body parts bathed by patient: Right arm, Left arm, Abdomen, Chest, Right upper leg, Left upper leg, Face, Front perineal area, Buttocks, Right lower leg, Left lower leg   Body parts bathed by helper: Front perineal area, Buttocks, Right lower leg, Left lower leg     Bathing assist Assist Level: Contact Guard/Touching assist(LH sponge)     Upper Body Dressing/Undressing Upper body dressing   What is the patient wearing?: Pull over shirt    Upper body assist Assist Level: Set up assist    Lower Body Dressing/Undressing Lower body dressing      What is the patient wearing?: Pants, Incontinence brief     Lower body assist Assist for lower body dressing: Minimal Assistance - Patient > 75%     Toileting Toileting    Toileting assist Assist for toileting: Maximal Assistance - Patient 25 - 49%     Transfers Chair/bed transfer  Transfers assist     Chair/bed transfer assist level: Minimal Assistance - Patient > 75%     Locomotion Ambulation   Ambulation assist      Assist level: Minimal Assistance - Patient > 75% Assistive device: Walker-rolling Max distance: 30'   Walk 10 feet activity   Assist     Assist level: Minimal Assistance - Patient >  75% Assistive device: Walker-rolling   Walk 50 feet activity   Assist    Assist level: 2 helpers Assistive device: Walker-rolling    Walk 150 feet activity   Assist Walk 150 feet activity did not occur: Safety/medical concerns         Walk 10 feet on uneven surface  activity   Assist Walk 10 feet on uneven surfaces activity did not occur: Safety/medical  concerns         Wheelchair     Assist Will patient use wheelchair at discharge?: No Type of Wheelchair: Manual    Wheelchair assist level: Supervision/Verbal cueing Max wheelchair distance: 150'    Wheelchair 50 feet with 2 turns activity    Assist        Assist Level: Supervision/Verbal cueing   Wheelchair 150 feet activity     Assist      Assist Level: Supervision/Verbal cueing   Blood pressure (!) 164/58, pulse (!) 55, temperature (!) 97.3 F (36.3 C), resp. rate 19, height 5\' 7"  (1.702 m), weight 111.1 kg, SpO2 97 %.  Medical Problem List and Plan: 1.  Impaired mobility and ADLs secondary to cervical myelopathy             -patient may shower, but spinal incision should be covered             -ELOS/Goals: ModI in PT, OT, I in SLP  -Visitor is daughter, Catalina Lunger.  2.  Antithrombotics: -DVT/anticoagulation:  Pharmaceutical: Heparin             -antiplatelet therapy: ASA             -chronic coumadin is being held until post-op day #10  12/22- restarted 12/20 per pt, but not on list- needs to restart 12/25!  12/25: Restarted Coumadin home dose of 3mg  Mondays and Fridays. 3. Pain Management:  Oxycodone prn  12/22- will stop Flexeril since likely causing hallucinations and start Skelaxin 800 mg TID prn for muscle spasms/tightness  12/23-12/25: Using Skelaxin and Oxycodone prn for pain.  4. Mood: LCSW to follow for evaluation and support.              -antipsychotic agents: NA 5. Neuropsych: This patient is capable of making decisions on her own behalf. 6. Skin/Wound Care: routine pressure relief measures.  7. Fluids/Electrolytes/Nutrition: Monitor I/O. Check lytes in am.  8.  Subdural/intradural collection with cord compression: On IV decadron 10 mg every 4 hours- per NSU, no need for additional surgery. 9. Hyperglycemia: has h/o diet controlled DM in the past. Will monitor BS ac/hs as now on steroids.  10.  OSA: continue CPAP at nights.  12/22- pt  refusing- said wants 2L O2 at night.   11. PAF: On Bystolic bid.  Monitor HR TID--continue to hold coumadin.  12. H/o iron deficiency anemia: 13. H/o Anxiety disorder: Has been stable on Zoloft.  14. Hypokalemia: Was on Kdur 10 meq bid but has had progressive drop 3.2-->2.9. Will increase to 40 meq bid for today and recheck labs in am. Adjust as needed.   12/22- K+ 3.9- well controlled  12/24: Will repeat BMP tomorrow to check K+.   12/25: BMP stable.  15. Thrombocytopenia: Recheck in am.   12/22- plts 155k 16. HTN/Peripheral edema: On Demadex daily  17. Atrial fibrillation: Coumadin will be restarted 12/25.      LOS: 4 days A FACE TO FACE EVALUATION WAS PERFORMED  Bert Givans P Ausar Georgiou 04/08/2019, 8:25 AM

## 2019-04-08 NOTE — Plan of Care (Signed)
  Problem: Consults Goal: RH SPINAL CORD INJURY PATIENT EDUCATION Description:  See Patient Education module for education specifics.  Outcome: Progressing Goal: Skin Care Protocol Initiated - if Braden Score 18 or less Description: If consults are not indicated, leave blank or document N/A Outcome: Progressing   Problem: SCI BOWEL ELIMINATION Goal: RH STG MANAGE BOWEL WITH ASSISTANCE Description: STG Manage Bowel with min Assistance. Outcome: Progressing   Problem: SCI BLADDER ELIMINATION Goal: RH STG MANAGE BLADDER WITH ASSISTANCE Description: STG Manage Bladder With min Assistance Outcome: Progressing   Problem: RH SAFETY Goal: RH STG ADHERE TO SAFETY PRECAUTIONS W/ASSISTANCE/DEVICE Description: STG Adhere to Safety Precautions With mod I Assistance/Device. Outcome: Progressing Goal: RH STG DECREASED RISK OF FALL WITH ASSISTANCE Description: STG Decreased Risk of Fall With mod I Assistance. Outcome: Progressing   Problem: RH SKIN INTEGRITY Goal: RH STG SKIN FREE OF INFECTION/BREAKDOWN Description: Patients skin will remain free from further infection or breakdown with mod I assist. Outcome: Progressing Goal: RH STG MAINTAIN SKIN INTEGRITY WITH ASSISTANCE Description: STG Maintain Skin Integrity With mod I Assistance. Outcome: Progressing   Problem: RH PAIN MANAGEMENT Goal: RH STG PAIN MANAGED AT OR BELOW PT'S PAIN GOAL Description: < 4 Outcome: Progressing

## 2019-04-09 ENCOUNTER — Inpatient Hospital Stay (HOSPITAL_COMMUNITY): Payer: PRIVATE HEALTH INSURANCE

## 2019-04-09 ENCOUNTER — Inpatient Hospital Stay (HOSPITAL_COMMUNITY): Payer: PRIVATE HEALTH INSURANCE | Admitting: Occupational Therapy

## 2019-04-09 LAB — PROTIME-INR
INR: 1.2 (ref 0.8–1.2)
Prothrombin Time: 14.9 seconds (ref 11.4–15.2)

## 2019-04-09 LAB — GLUCOSE, CAPILLARY
Glucose-Capillary: 137 mg/dL — ABNORMAL HIGH (ref 70–99)
Glucose-Capillary: 126 mg/dL — ABNORMAL HIGH (ref 70–99)
Glucose-Capillary: 133 mg/dL — ABNORMAL HIGH (ref 70–99)
Glucose-Capillary: 150 mg/dL — ABNORMAL HIGH (ref 70–99)

## 2019-04-09 MED ORDER — WARFARIN SODIUM 2 MG PO TABS
2.0000 mg | ORAL_TABLET | Freq: Once | ORAL | Status: AC
  Administered 2019-04-09: 2 mg via ORAL
  Filled 2019-04-09: qty 1

## 2019-04-09 NOTE — Plan of Care (Signed)
  Problem: Consults Goal: RH SPINAL CORD INJURY PATIENT EDUCATION Description:  See Patient Education module for education specifics.  Outcome: Progressing Goal: Skin Care Protocol Initiated - if Braden Score 18 or less Description: If consults are not indicated, leave blank or document N/A Outcome: Progressing   Problem: SCI BOWEL ELIMINATION Goal: RH STG MANAGE BOWEL WITH ASSISTANCE Description: STG Manage Bowel with min Assistance. Outcome: Progressing   Problem: SCI BLADDER ELIMINATION Goal: RH STG MANAGE BLADDER WITH ASSISTANCE Description: STG Manage Bladder With min Assistance Outcome: Progressing   Problem: RH SAFETY Goal: RH STG ADHERE TO SAFETY PRECAUTIONS W/ASSISTANCE/DEVICE Description: STG Adhere to Safety Precautions With mod I Assistance/Device. Outcome: Progressing Goal: RH STG DECREASED RISK OF FALL WITH ASSISTANCE Description: STG Decreased Risk of Fall With mod I Assistance. Outcome: Progressing   Problem: RH SKIN INTEGRITY Goal: RH STG SKIN FREE OF INFECTION/BREAKDOWN Description: Patients skin will remain free from further infection or breakdown with mod I assist. Outcome: Progressing Goal: RH STG MAINTAIN SKIN INTEGRITY WITH ASSISTANCE Description: STG Maintain Skin Integrity With mod I Assistance. Outcome: Progressing   Problem: RH PAIN MANAGEMENT Goal: RH STG PAIN MANAGED AT OR BELOW PT'S PAIN GOAL Description: < 4 Outcome: Progressing

## 2019-04-09 NOTE — Progress Notes (Signed)
Occupational Therapy Session Note  Patient Details  Name: Natasha Lawson MRN: [id number] Date of Birth: 12/24/1940  Today's Date: 04/09/2019 OT Individual Time: 1400-1445 OT Individual Time Calculation (min): 45 min    Short Term Goals: Week 1:  OT Short Term Goal 1 (Week 1): Pt will complete sit>stand as part of dressing task with min A OT Short Term Goal 2 (Week 1): Pt will stand to complete 1 grooming task with CGA in order to increase functional standing balance/endurance OT Short Term Goal 3 (Week 1): Pt will complete LB dressing with min A using AE PRN OT Short Term Goal 4 (Week 1): Pt will complete toileting task with mod A  Skilled Therapeutic Interventions/Progress Updates:    Pt received supine in good spirits, reporting some fatigue from previous sessions but no pain. Pt transferred supine > EOB with (S). Pt used dressing stick to don pants with min A overall. Assistance provided to thread over gripper socks and to pull up in standing thoroughly. From EOB pt able to stand with min A using RW. Pt completed 35 ft of functional mobility with occasional slight LOB from foot drag bilaterally. CGA-min A provided throughout. Pt was taken in w/c to IADL apt. Pt required mod A to stand from w/c. Pt completed functional reaching task into cabinets. Edu provided throughout activity on cooking tasks and reducing fall risk/energy conservation. Pt required min-mod facilitation when reaching >80 degrees with an item of weight. Pt returned to her room and was left supine with all needs met. Bed alarm set.   Therapy Documentation Precautions:  Precautions Precautions: Cervical Required Braces or Orthoses: Cervical Brace Cervical Brace: For comfort, Soft collar Restrictions Weight Bearing Restrictions: No    Therapy/Group: Individual Therapy  Curtis Sites 04/09/2019, 12:09 PM

## 2019-04-09 NOTE — IPOC Note (Signed)
Overall Plan of Care [facility]) Patient Details Name: Natasha Lawson MRN: [id number] DOB: 1940/11/05  Admitting Diagnosis: Cervical arthritis with myelopathy  Hospital Problems: Principal Problem:   Cervical arthritis with myelopathy Active Problems:   HTN (hypertension)   Quadriplegia ([place])   Atrial fibrillation ([place])   Anticoagulated on Coumadin     Functional Problem List: Nursing Bladder, Bowel, Edema, Endurance, Safety, Motor, Skin Integrity  PT Balance, Endurance, Motor, Safety, Sensory  OT Balance, Safety, Sensory, Skin Integrity, Endurance, Motor, Pain  SLP    TR         Basic ADL's: OT Grooming, Bathing, Dressing, Toileting     Advanced  ADL's: OT       Transfers: PT Bed Mobility, Bed to Chair, Car, [employer], Futures trader, Metallurgist: PT Ambulation, Emergency planning/management officer, Stairs     Additional Impairments: OT None  SLP        TR      Anticipated Outcomes Item Anticipated Outcome  Self Feeding Indep  Swallowing      Basic self-care  Supervision  Toileting  Supervision   Bathroom Transfers Supervision-min A  Bowel/Bladder  Incontient at times, improve continence during admission, min assist  Transfers  Supervision  Locomotion  Supervision with LRAD  Communication     Cognition     Pain  no c/o pain, remain pain free  Safety/Judgment  Hx of falls at home, prevent skin breakdown, prevent injury on unit and during therapy   Therapy Plan: PT Intensity: Minimum of 1-2 x/day ,45 to 90 minutes PT Frequency: 5 out of 7 days PT Duration Estimated Length of Stay: 14-16 days OT Intensity: Minimum of 1-2 x/day, 45 to 90 minutes OT Frequency: 5 out of 7 days OT Duration/Estimated Length of Stay: ~2- 2.5 weeks     Due to the current state of emergency, patients may not be receiving their 3-hours of Medicare-mandated therapy.   Team Interventions: Nursing Interventions Patient/Family Education, Bladder Management, Bowel Management,  Discharge Planning, Psychosocial Support, Skin Care/Wound Management  PT interventions Ambulation/gait training, Training and development officer, Community reintegration, Discharge planning, DME/adaptive equipment instruction, Functional mobility training, Neuromuscular re-education, Patient/family education, Psychosocial support, Stair training, Therapeutic Activities, Therapeutic Exercise, UE/LE Strength taining/ROM, UE/LE Coordination activities  OT Interventions Balance/vestibular training, Discharge planning, Functional electrical stimulation, Pain management, Self Care/advanced ADL retraining, Therapeutic Activities, UE/LE Coordination activities, Therapeutic Exercise, Patient/family education, Functional mobility training, Disease mangement/prevention, Community reintegration, Engineer, drilling, Psychosocial support, UE/LE Strength taining/ROM  SLP Interventions    TR Interventions    SW/CM Interventions Psychosocial Support, Patient/Family Education, Discharge Planning   Barriers to Discharge MD  Medical stability  Nursing Home environment access/layout, Incontinence, Neurogenic Bowel & Bladder, Weight, Wound Care    PT Medical stability, Home environment access/layout, Decreased caregiver support    OT      SLP      SW       Team Discharge Planning: Destination: PT-Home ,OT- Home , SLP-  Projected Follow-up: PT-Home health PT, OT-  Home health OT, SLP-  Projected Equipment Needs: PT-Rolling walker with 5" wheels, OT- To be determined, SLP-  Equipment Details: PT-pt owns rollator, may benefit from use of RW for improved safety, OT-  Patient/family involved in discharge planning: PT- Patient,  OT-Patient, SLP-   MD ELOS: 10-14 days Medical Rehab Prognosis:  Excellent Assessment: Mrs. Ackerley is progressing well with therapy and has an excellent attitude. Medical issues include restarting of Coumadin, pain control, incision site monitoring, and management  of uncontrolled  hypertension.    See Team Conference Notes for weekly updates to the plan of care

## 2019-04-09 NOTE — Progress Notes (Signed)
Occupational Therapy Session Note  Patient Details  Name: Natasha Lawson MRN: [id number] Date of Birth: May 11, 1940  Today's Date: 04/09/2019 OT Individual Time: [id number] Individual Time Calculation (min): 70 min    Short Term Goals: Week 1:  OT Short Term Goal 1 (Week 1): Pt will complete sit>stand as part of dressing task with min A OT Short Term Goal 2 (Week 1): Pt will stand to complete 1 grooming task with CGA in order to increase functional standing balance/endurance OT Short Term Goal 3 (Week 1): Pt will complete LB dressing with min A using AE PRN OT Short Term Goal 4 (Week 1): Pt will complete toileting task with mod A  Skilled Therapeutic Interventions/Progress Updates:    Pt seen for OT session focusing on ADL re-training and functional standing balance/endurance during therapeutic activity. Pt resting in supine upon arrival, denying pain and agreeable to tx session.  She declined full bathing/dressing routine this AM, opting to change clothes.  Transfers: Supine>sitting EOB with supervision using hospital bed functions. Sit>stand throughout session min-mod A to RW depending on fatigue level and surface height  ADL Re-Training: Donned new gown seated EOB with set-up and increased time. Stood at sink to complete 3 grooming tasks with close supervision and w/c placed directly behind her for safety.   Therapeutic Activity: In therapy gym, completed dynamic standing activity with RW. Completed x6 trials in total, alternating UE support on RW and completing without UE support. Trialed standing on small foam wedge to facilitate anterior weight shift, min A overall for balance. Seated rest breaks btwn trials. Min-mod A sit>stand from w/c with VCs for technique and increased time.   Pt returned to room at end of session. Ambulated from doorway to recliner on opposite side of room with min A and increased time. Pt left seated in recliner at end of session, all needs in reach.   Therapy  Documentation Precautions:  Precautions Precautions: Cervical Required Braces or Orthoses: Cervical Brace Cervical Brace: For comfort, Soft collar Restrictions Weight Bearing Restrictions: No   Therapy/Group: Individual Therapy  Lennis Korb L 04/09/2019, 6:53 AM

## 2019-04-09 NOTE — Progress Notes (Signed)
Physical Therapy Session Note  Patient Details  Name: Natasha Lawson MRN: [id number] Date of Birth: 18-Sep-1940  Today's Date: 04/09/2019 PT Individual Time: [id number] and [id number] PT Individual Time Calculation (min): 55 min and 58 min     Short Term Goals: Week 1:  PT Short Term Goal 1 (Week 1): Pt will complete bed mobility with min A PT Short Term Goal 2 (Week 1): Pt will ambulate x 100 ft with LRAD and assist x 1 PT Short Term Goal 3 (Week 1): Pt will initiate stair training  Skilled Therapeutic Interventions/Progress Updates:    Session 1: Pt supine in bed upon PT arrival, agreeable to therapy tx and reports cervical pain 2/10 this morning. Pt transferred to sitting EOB with supervision. Pt seated EOB worked on sitting balance during upper/lower body dressing. Therapist assisted to don teds and socks. Pt performed stand pivot to the bedside commode with RW and min assist, continent of bladder and supervision for pericare. Pt donned underwear/pad and pants with min assist, sit<>stand with RW and min assist and cues for techniques. Pt performed stand pivot from commode>w/c with RW and min assist. Pt performed UE w/c propulsion working on UE strength and endurance x 150 ft with increased time. Pt worked on standing balance and UE fine motor control to perform clothespin activity using either hand. Pt worked on gait training this session to ambulate x15 ft with RW and min assist (w/c follow for safety), cues for step length and upright posture, pt limited by "swimmy headedness, and being shaky." Pt transported back to room and requesting to lay back down. Stand pivot to bed with RW and min assist, sit>supine with supervision. Pt left supine with needs in reach and bed alarm set.   Session 2: Pt supine in bed upon PT arrival, agreeable to therapy tx and reports pain [id number], limited certain activities for pain relief. Pt transferred to sitting EOB with supervision, stand pivot to w/c with min assist  and propelled w/c x 150 ft with supervision working on UE strength and endurance. Pt transported the rest of the way to the dayroom. Stand pivot this session from w/c<>nustep with RW and min assist, used nustep this session for global strength and endurance x 10 minutes on workload 5. Pt worked on gait this session to ambulate x25 ft with RW and min assist, cues for foot clearance and step length. Pt transported back to gym. Pt worked on standing balance and foot clearance this session to perform toe taps on aerobic step 2 x 10 with UE support on RW, cues for techniques. Pt performed 2 x 10 mini squats without UE support this session working on LE strength and knee control, cues and facilitation to limit posterior LOB and increase anterior weightshift. Pt ambulated another 30 ft with RW and min assist, therapist bringing w/c for follow. Pt transported back to room and left in w/c with needs in reach and chair alarm set.   Therapy Documentation Precautions:  Precautions Precautions: Cervical Required Braces or Orthoses: Cervical Brace Cervical Brace: For comfort, Soft collar Restrictions Weight Bearing Restrictions: No    Therapy/Group: Individual Therapy  Netta Corrigan , PT, DPT, CSRS 04/09/2019, 7:55 AM

## 2019-04-09 NOTE — Progress Notes (Signed)
[place] PHYSICAL MEDICINE & REHABILITATION PROGRESS NOTE   Subjective/Complaints: Pt reports hallucinations have completely stopped Pain better and spasms aren't quite as bad. Moving bowels regularly.  Appreciate pharmacy involvement in coumadin management  ROS- pt denies SOB, CP, Abd pain, HA, Constipation, N/V/D Objective:   No results found. No results for input(s): WBC, HGB, HCT, PLT in the last 72 hours. Recent Labs    04/08/19 0330  NA 144  K 3.9  CL 110  CO2 26  GLUCOSE 183*  BUN 37*  CREATININE 0.91  CALCIUM 8.3*    Intake/Output Summary (Last 24 hours) at 04/09/2019 1332 Last data filed at 04/09/2019 0737 Gross per 24 hour  Intake 610 ml  Output 250 ml  Net 360 ml     Physical Exam: Vital Signs Blood pressure (!) 156/69, pulse (!) 51, temperature 97.6 F (36.4 C), temperature source Oral, resp. rate 16, height 5\' 7"  (1.702 m), weight 111.1 kg, SpO2 97 %.  Physical Exam  Nursing note and vitals reviewed. General: Alert and oriented x 3, No apparent distress. Obese,  Sitting up in bed, NAD HEENT: Head is normocephalic, atraumatic, PERRLA, EOMI,  Neck: tight trigger points in upper traps/levators/scalenes, and rhomboids Heart: irregular; rate controlled No murmurs rubs or gallops Chest: CTA bilaterally without wheezes, rales, or rhonchi; no distress Abdomen: Soft, non-tender, non-distended, bowel sounds positive. Extremities: No clubbing, cyanosis, or edema. Pulses are 2+ Skin: Posterior cervical spine incision with mild erythema but C/D/I  Neuro: She is alert and oriented to person, place, and time.  Has diffuse patchy numbness present in all extremities.  Musculoskeletal: 1+ pedal edema.  Well healed right TKR. 4/5 strength on LUE and 4+/5 strength in other extremities.  Psych: Pt's affect is appropriate. Pt is cooperative     Assessment/Plan: 1. 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  Physiatrist is providing close team supervision and 24 hour management of active medical problems listed below.  Physiatrist and rehab team continue to assess barriers to discharge/monitor patient progress toward functional and medical goals  Care Tool:  Bathing    Body parts bathed by patient: Right arm, Left arm, Abdomen, Chest, Right upper leg, Left upper leg, Face, Front perineal area, Buttocks, Right lower leg, Left lower leg   Body parts bathed by helper: Front perineal area, Buttocks, Right lower leg, Left lower leg     Bathing assist Assist Level: Contact Guard/Touching assist(LH sponge)     Upper Body Dressing/Undressing Upper body dressing   What is the patient wearing?: Pull over shirt    Upper body assist Assist Level: Set up assist    Lower Body Dressing/Undressing Lower body dressing      What is the patient wearing?: Pants, Incontinence brief     Lower body assist Assist for lower body dressing: Minimal Assistance - Patient > 75%     Toileting Toileting    Toileting assist Assist for toileting: Minimal Assistance - Patient > 75%     Transfers Chair/bed transfer  Transfers assist     Chair/bed transfer assist level: Minimal Assistance - Patient > 75%     Locomotion Ambulation   Ambulation assist      Assist level: Minimal Assistance - Patient > 75% Assistive device: Walker-rolling Max distance: 15 ft   Walk 10 feet activity   Assist     Assist level: Minimal Assistance -  Patient > 75% Assistive device: Walker-rolling   Walk 50 feet activity   Assist    Assist level: 2 helpers Assistive device: Walker-rolling    Walk 150 feet activity   Assist Walk 150 feet activity did not occur: Safety/medical concerns         Walk 10 feet on uneven surface  activity   Assist Walk 10 feet on uneven surfaces  activity did not occur: Safety/medical concerns         Wheelchair     Assist Will patient use wheelchair at discharge?: No Type of Wheelchair: Manual    Wheelchair assist level: Supervision/Verbal cueing Max wheelchair distance: 150'    Wheelchair 50 feet with 2 turns activity    Assist        Assist Level: Supervision/Verbal cueing   Wheelchair 150 feet activity     Assist      Assist Level: Supervision/Verbal cueing   Blood pressure (!) 156/69, pulse (!) 51, temperature 97.6 F (36.4 C), temperature source Oral, resp. rate 16, height 5\' 7"  (1.702 m), weight 111.1 kg, SpO2 97 %.  Medical Problem List and Plan: 1.  Impaired mobility and ADLs secondary to cervical myelopathy             -patient may shower, but spinal incision should be covered             -ELOS/Goals: ModI in PT, OT, I in SLP  -Visitor is daughter, Catalina Lunger.  2.  Antithrombotics: -DVT/anticoagulation:  Pharmaceutical: Heparin             -antiplatelet therapy: ASA             -chronic coumadin is being held until post-op day #10  12/22- restarted 12/20 per pt, but not on list- needs to restart 12/25!  12/25: Restarted Coumadin home dose of 3mg  Mondays and Fridays.  12/26: [place] involvement in coumadin management. INR goal is 2-3. Will receive one time warfarin 2mg  dose tonight. Daily INR. Monitor for signs of bleeding.  3. Pain Management:  Oxycodone prn  12/22- will stop Flexeril since likely causing hallucinations and start Skelaxin 800 mg TID prn for muscle spasms/tightness  12/23-12/25: Using Skelaxin and Oxycodone prn for pain.  4. Mood: LCSW to follow for evaluation and support.              -antipsychotic agents: NA 5. Neuropsych: This patient is capable of making decisions on her own behalf. 6. Skin/Wound Care: routine pressure relief measures.  7. Fluids/Electrolytes/Nutrition: Monitor I/O. Check lytes in am.  8.  Subdural/intradural collection with cord  compression: On IV decadron 10 mg every 4 hours- per NSU, no need for additional surgery. 9. Hyperglycemia: has h/o diet controlled DM in the past. Will monitor BS ac/hs as now on steroids.  10.  OSA: continue CPAP at nights.  12/22- pt refusing- said wants 2L O2 at night.   11. PAF: On Bystolic bid.  Monitor HR TID--continue to hold coumadin.  12. H/o iron deficiency anemia: 13. H/o Anxiety disorder: Has been stable on Zoloft.  14. Hypokalemia: Was on Kdur 10 meq bid but has had progressive drop 3.2-->2.9. Will increase to 40 meq bid for today and recheck labs in am. Adjust as needed.   12/22- K+ 3.9- well controlled  12/24: Will repeat BMP tomorrow to check K+.   12/25: BMP stable.  15. Thrombocytopenia: Recheck in am.   12/22- plts 155k 16. HTN/Peripheral edema: On Demadex daily  17. Atrial fibrillation:  Coumadin restarted 12/25 (see #2)      LOS: 5 days A FACE TO FACE EVALUATION WAS PERFORMED  Demetry Bendickson P Miasia Crabtree 04/09/2019, 1:32 PM

## 2019-04-09 NOTE — Progress Notes (Signed)
ANTICOAGULATION CONSULT NOTE - Initial Consult  Pharmacy Consult for warfarin Indication: atrial fibrillation  Allergies  Allergen Reactions  . Capoten [Captopril] Swelling    Tongue   . Iodine Hives  . Norvasc [Amlodipine Besylate] Swelling  . Shellfish Allergy Hives  . Terazosin Hcl Other (See Comments)    Caused a-fib  . Xarelto [Rivaroxaban] Other (See Comments)    Severe back pain  . Welchol [Colesevelam Hcl] Other (See Comments)    Confusion   . Crestor [Rosuvastatin Calcium] Other (See Comments)    Muscle aches  . Fenofibrate Other (See Comments)    Muscle aches  . Pravachol [Pravastatin Sodium] Other (See Comments)    fatigue  . Spironolactone Diarrhea  . Statins Other (See Comments)    Joint pain    Patient Measurements: Height: 5\' 7"  (170.2 cm) Weight: 244 lb 14.9 oz (111.1 kg) IBW/kg (Calculated) : 61.6  Vital Signs: Temp: 97.6 F (36.4 C) (12/26 0547) Temp Source: Oral (12/26 0547) BP: 156/69 (12/26 0547) Pulse Rate: 51 (12/26 0547)  Labs: Recent Labs    04/07/19 0508 04/08/19 0330 04/09/19 0731  LABPROT 14.3 14.4 14.9  INR 1.1 1.1 1.2  CREATININE  --  0.91  --     Estimated Creatinine Clearance: 67.6 mL/min (by C-G formula based on SCr of 0.91 mg/dL).   Medical History: Past Medical History:  Diagnosis Date  . Anxiety   . Cancer [facility])    breast cancer right  . Dysrhythmia    A-Fib on coumadin  . GERD (gastroesophageal reflux disease)   . Gout   . 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 History of blood transfusion   . Hypertension   . Sleep apnea    uses CPAP nightly     Assessment: 32 yoF with AFib on warfarin PTA admitted with cervical laminectomy. Per neurology, pharmacy can resume warfarin on POD#10 (12/25). Nursing spoke with patient and she states PTA she was taking Warfarin 2 mg PO daily.    Goal of Therapy:  INR 2-3 Monitor platelets by anticoagulation protocol: Yes   Plan:  -Will give Warfarin 2 mg PO x 1 dose  tonight  -Daily Pt-INR -Monitor patient for s/s of bleeding   Duanne Limerick, PharmD, BCPS Clinical Pharmacist [phone] Please check AMION for all [place] numbers 04/09/2019

## 2019-04-10 ENCOUNTER — Inpatient Hospital Stay (HOSPITAL_COMMUNITY): Payer: PRIVATE HEALTH INSURANCE | Admitting: Occupational Therapy

## 2019-04-10 LAB — CBC
HCT: 32.7 % — ABNORMAL LOW (ref 36.0–46.0)
Hemoglobin: 11 g/dL — ABNORMAL LOW (ref 12.0–15.0)
MCH: 33.1 pg (ref 26.0–34.0)
MCHC: 33.6 g/dL (ref 30.0–36.0)
MCV: 98.5 fL (ref 80.0–100.0)
Platelets: 148 10*3/uL — ABNORMAL LOW (ref 150–400)
RBC: 3.32 MIL/uL — ABNORMAL LOW (ref 3.87–5.11)
RDW: 14.2 % (ref 11.5–15.5)
WBC: 10 10*3/uL (ref 4.0–10.5)
nRBC: 0 % (ref 0.0–0.2)

## 2019-04-10 LAB — GLUCOSE, CAPILLARY
Glucose-Capillary: 150 mg/dL — ABNORMAL HIGH (ref 70–99)
Glucose-Capillary: 124 mg/dL — ABNORMAL HIGH (ref 70–99)
Glucose-Capillary: 194 mg/dL — ABNORMAL HIGH (ref 70–99)
Glucose-Capillary: 218 mg/dL — ABNORMAL HIGH (ref 70–99)

## 2019-04-10 LAB — PROTIME-INR
INR: 1.4 — ABNORMAL HIGH (ref 0.8–1.2)
Prothrombin Time: 17.1 seconds — ABNORMAL HIGH (ref 11.4–15.2)

## 2019-04-10 MED ORDER — PREGABALIN 25 MG PO CAPS
25.0000 mg | ORAL_CAPSULE | Freq: Every day | ORAL | Status: DC
  Filled 2019-04-10 (×2): qty 1

## 2019-04-10 MED ORDER — WARFARIN SODIUM 2 MG PO TABS
2.0000 mg | ORAL_TABLET | Freq: Once | ORAL | Status: AC
  Administered 2019-04-10: 2 mg via ORAL
  Filled 2019-04-10: qty 1

## 2019-04-10 NOTE — Progress Notes (Signed)
ANTICOAGULATION CONSULT NOTE - Initial Consult  Pharmacy Consult for warfarin Indication: atrial fibrillation  Allergies  Allergen Reactions  . Capoten [Captopril] Swelling    Tongue   . Iodine Hives  . Norvasc [Amlodipine Besylate] Swelling  . Shellfish Allergy Hives  . Terazosin Hcl Other (See Comments)    Caused a-fib  . Xarelto [Rivaroxaban] Other (See Comments)    Severe back pain  . Welchol [Colesevelam Hcl] Other (See Comments)    Confusion   . Crestor [Rosuvastatin Calcium] Other (See Comments)    Muscle aches  . Fenofibrate Other (See Comments)    Muscle aches  . Pravachol [Pravastatin Sodium] Other (See Comments)    fatigue  . Spironolactone Diarrhea  . Statins Other (See Comments)    Joint pain    Patient Measurements: Height: 5\' 7"  (170.2 cm) Weight: 244 lb 14.9 oz (111.1 kg) IBW/kg (Calculated) : 61.6  Vital Signs: Temp: 97.7 F (36.5 C) (12/27 0426) Temp Source: Oral (12/27 0426) BP: 153/82 (12/27 0426) Pulse Rate: 55 (12/27 0426)  Labs: Recent Labs    04/08/19 0330 04/09/19 0731 04/10/19 0657  HGB  --   --  11.0*  HCT  --   --  32.7*  PLT  --   --  148*  LABPROT 14.4 14.9 17.1*  INR 1.1 1.2 1.4*  CREATININE 0.91  --   --     Estimated Creatinine Clearance: 67.6 mL/min (by C-G formula based on SCr of 0.91 mg/dL).   Medical History: Past Medical History:  Diagnosis Date  . Anxiety   . Cancer [facility])    breast cancer right  . Dysrhythmia    A-Fib on coumadin  . GERD (gastroesophageal reflux disease)   . Gout   . 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 History of blood transfusion   . Hypertension   . Sleep apnea    uses CPAP nightly     Assessment: 82 yoF with AFib on warfarin PTA admitted with cervical laminectomy. Per neurology, pharmacy can resume warfarin on POD#10 (12/25). Nursing spoke with patient and she states PTA she was taking Warfarin 2 mg PO daily. INR starting to rise today expect to continue tomorrow.  Goal  of Therapy:  INR 2-3 Monitor platelets by anticoagulation protocol: Yes   Plan:  -Will give Warfarin 2 mg PO x 1 dose tonight  -Daily Pt-INR -Monitor patient for s/s of bleeding   Duanne Limerick, PharmD, BCPS Clinical Pharmacist [phone] Please check AMION for all [place] numbers 04/10/2019

## 2019-04-10 NOTE — Progress Notes (Signed)
Patient prefers not to take the Lyrica for neuropathic pain at this time.  She spoke with her husband who had a bad reaction to it and given her long list of allergies and her sensitivity to medications, she wants to hold off for now.  She says the numbness and tingling comes and goes and is something she can manage with right now.  Brita Romp, RN

## 2019-04-10 NOTE — Plan of Care (Signed)
  Problem: Consults Goal: RH SPINAL CORD INJURY PATIENT EDUCATION Description:  See Patient Education module for education specifics.  Outcome: Progressing Goal: Skin Care Protocol Initiated - if Braden Score 18 or less Description: If consults are not indicated, leave blank or document N/A Outcome: Progressing   Problem: SCI BOWEL ELIMINATION Goal: RH STG MANAGE BOWEL WITH ASSISTANCE Description: STG Manage Bowel with min Assistance. Outcome: Progressing   Problem: SCI BLADDER ELIMINATION Goal: RH STG MANAGE BLADDER WITH ASSISTANCE Description: STG Manage Bladder With min Assistance Outcome: Progressing   Problem: RH SAFETY Goal: RH STG ADHERE TO SAFETY PRECAUTIONS W/ASSISTANCE/DEVICE Description: STG Adhere to Safety Precautions With mod I Assistance/Device. Outcome: Progressing Goal: RH STG DECREASED RISK OF FALL WITH ASSISTANCE Description: STG Decreased Risk of Fall With mod I Assistance. Outcome: Progressing   Problem: RH SKIN INTEGRITY Goal: RH STG SKIN FREE OF INFECTION/BREAKDOWN Description: Patients skin will remain free from further infection or breakdown with mod I assist. Outcome: Progressing Goal: RH STG MAINTAIN SKIN INTEGRITY WITH ASSISTANCE Description: STG Maintain Skin Integrity With mod I Assistance. Outcome: Progressing   Problem: RH PAIN MANAGEMENT Goal: RH STG PAIN MANAGED AT OR BELOW PT'S PAIN GOAL Description: < 4 Outcome: Progressing

## 2019-04-10 NOTE — Progress Notes (Signed)
[place] PHYSICAL MEDICINE & REHABILITATION PROGRESS NOTE   Subjective/Complaints: Having worsening burning pain and tingling in her right hand and leg.  Moving bowels regularly.  Appreciate pharmacy involvement in coumadin management Labs stable today  ROS- pt denies SOB, CP, Abd pain, HA, Constipation, N/V/D Objective:   No results found. Recent Labs    04/10/19 0657  WBC 10.0  HGB 11.0*  HCT 32.7*  PLT 148*   Recent Labs    04/08/19 0330  NA 144  K 3.9  CL 110  CO2 26  GLUCOSE 183*  BUN 37*  CREATININE 0.91  CALCIUM 8.3*    Intake/Output Summary (Last 24 hours) at 04/10/2019 1302 Last data filed at 04/10/2019 0814 Gross per 24 hour  Intake 855 ml  Output --  Net 855 ml     Physical Exam: Vital Signs Blood pressure (!) 153/82, pulse (!) 55, temperature 97.7 F (36.5 C), temperature source Oral, resp. rate 16, height 5\' 7"  (1.702 m), weight 111.1 kg, SpO2 100 %.  Physical Exam  Nursing note and vitals reviewed. General: Alert and oriented x 3, No apparent distress. Obese,  Sitting up in bed, NAD HEENT: Head is normocephalic, atraumatic, PERRLA, EOMI,  Neck: tight trigger points in upper traps/levators/scalenes, and rhomboids Heart: irregular; rate controlled No murmurs rubs or gallops Chest: CTA bilaterally without wheezes, rales, or rhonchi; no distress Abdomen: Soft, non-tender, non-distended, bowel sounds positive. Extremities: No clubbing, cyanosis, or edema. Pulses are 2+ Skin: Posterior cervical spine incision with mild erythema but C/D/I  Neuro: She is alert and oriented to person, place, and time.  Has diffuse patchy numbness present in all extremities.  Musculoskeletal: 1+ pedal edema.  Well healed right TKR. 4/5 strength on LUE and 4+/5 strength in other extremities.  Psych: Pt's affect is appropriate. Pt is cooperative     Assessment/Plan: 1. 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  Physiatrist is providing close team supervision and 24 hour management of active medical problems listed below.  Physiatrist and rehab team continue to assess barriers to discharge/monitor patient progress toward functional and medical goals  Care Tool:  Bathing    Body parts bathed by patient: Right arm, Left arm, Abdomen, Chest, Right upper leg, Left upper leg, Face, Front perineal area, Buttocks, Right lower leg, Left lower leg   Body parts bathed by helper: Front perineal area, Buttocks, Right lower leg, Left lower leg     Bathing assist Assist Level: Contact Guard/Touching assist     Upper Body Dressing/Undressing Upper body dressing   What is the patient wearing?: Pull over shirt    Upper body assist Assist Level: Independent with assistive device Assistive Device Comment: dressing stick  Lower Body Dressing/Undressing Lower body dressing      What is the patient wearing?: Pants, Incontinence brief     Lower body assist Assist for lower body dressing: Minimal Assistance - Patient > 75%(dressing stick)     Toileting Toileting    Toileting assist Assist for toileting: Minimal Assistance - Patient > 75%     Transfers Chair/bed transfer  Transfers assist     Chair/bed transfer assist level: Minimal Assistance - Patient > 75%     Locomotion Ambulation   Ambulation assist      Assist level: Minimal Assistance - Patient > 75% Assistive device: Walker-rolling Max distance: 15 ft   Walk 10  feet activity   Assist     Assist level: Minimal Assistance - Patient > 75% Assistive device: Walker-rolling   Walk 50 feet activity   Assist    Assist level: 2 helpers Assistive device: Walker-rolling    Walk 150 feet activity   Assist Walk 150 feet activity did not occur: Safety/medical concerns         Walk 10 feet on  uneven surface  activity   Assist Walk 10 feet on uneven surfaces activity did not occur: Safety/medical concerns         Wheelchair     Assist Will patient use wheelchair at discharge?: No Type of Wheelchair: Manual    Wheelchair assist level: Supervision/Verbal cueing Max wheelchair distance: 150'    Wheelchair 50 feet with 2 turns activity    Assist        Assist Level: Supervision/Verbal cueing   Wheelchair 150 feet activity     Assist      Assist Level: Supervision/Verbal cueing   Blood pressure (!) 153/82, pulse (!) 55, temperature 97.7 F (36.5 C), temperature source Oral, resp. rate 16, height 5\' 7"  (1.702 m), weight 111.1 kg, SpO2 100 %.  Medical Problem List and Plan: 1.  Impaired mobility and ADLs secondary to cervical myelopathy             -patient may shower, but spinal incision should be covered             -ELOS/Goals: ModI in PT, OT, I in SLP  -Visitor is daughter, Catalina Lunger.  2.  Antithrombotics: -DVT/anticoagulation:  Pharmaceutical: Heparin             -antiplatelet therapy: ASA             -chronic coumadin is being held until post-op day #10  12/22- restarted 12/20 per pt, but not on list- needs to restart 12/25!  12/25: Restarted Coumadin home dose of 3mg  Mondays and Fridays.  12/26: [place] involvement in coumadin management. INR goal is 2-3. Will receive one time warfarin 2mg  dose tonight. Daily INR. Monitor for signs of bleeding.   12/27: Additional 2mg  warfarin tonight.  3. Pain Management:  Oxycodone prn  12/22- will stop Flexeril since likely causing hallucinations and start Skelaxin 800 mg TID prn for muscle spasms/tightness  12/23-12/25: Using Skelaxin and Oxycodone prn for pain.   12/27: Will add Lyrica 25mg  for burning pain in right hand and right leg. Patient prefers small dose as she does not like pain medications.  4. Mood: LCSW to follow for evaluation and support.              -antipsychotic agents:  NA 5. Neuropsych: This patient is capable of making decisions on her own behalf. 6. Skin/Wound Care: routine pressure relief measures.  7. Fluids/Electrolytes/Nutrition: Monitor I/O. Check lytes in am.  8.  Subdural/intradural collection with cord compression: On IV decadron 10 mg every 4 hours- per NSU, no need for additional surgery. 9. Hyperglycemia: has h/o diet controlled DM in the past. Will monitor BS ac/hs as now on steroids.  10.  OSA: continue CPAP at nights.  12/22- pt refusing- said wants 2L O2 at night.   11. PAF: On Bystolic bid.  Monitor HR TID--continue to hold coumadin.  12. H/o iron deficiency anemia: 13. H/o Anxiety disorder: Has been stable on Zoloft.  14. Hypokalemia: Was on Kdur 10 meq bid but has had progressive drop 3.2-->2.9. Will increase to 40 meq bid for today and recheck  labs in am. Adjust as needed.   12/22- K+ 3.9- well controlled  12/24: Will repeat BMP tomorrow to check K+.   12/25: BMP stable.  15. Thrombocytopenia: Recheck in am.   12/22- plts 155k 16. HTN/Peripheral edema: On Demadex daily  17. Atrial fibrillation: Coumadin restarted 12/25 (see #2)      LOS: 6 days A FACE TO FACE EVALUATION WAS PERFORMED  Natasha Lawson P Terelle Dobler 04/10/2019, 1:02 PM

## 2019-04-10 NOTE — Progress Notes (Addendum)
Occupational Therapy Session Note  Patient Details  Name: Natasha Lawson MRN: [id number] Date of Birth: 11/20/1940  Today's Date: 04/10/2019 OT Individual Time:0725-0800 and  0900-1030 OT Individual Time Calculation (min): 25 min and 90 min    Short Term Goals: Week 1:  OT Short Term Goal 1 (Week 1): Pt will complete sit>stand as part of dressing task with min A OT Short Term Goal 2 (Week 1): Pt will stand to complete 1 grooming task with CGA in order to increase functional standing balance/endurance OT Short Term Goal 3 (Week 1): Pt will complete LB dressing with min A using AE PRN OT Short Term Goal 4 (Week 1): Pt will complete toileting task with mod A  Skilled Therapeutic Interventions/Progress Updates:  Therapist arrived for 7:00 scheduled OT session, pt eating breakfast in bed and requesting therapist to return at late time.  Therapist returned at 7:25 and pt agreeable to tx session.    Session One: Pt seen for OT session focusing on functional transfers and ADL Re-training:  Supine>sitting EOB with supervision using hospital bed functions. Stand pivot transfer EOB> heavy duty BSC, completed with min A overall to stand from elevated EOB and VCs for RW management. Steadying assist for toileting task while managing gown. Used toilet aid to complete hygiene. Min-mod A to stand from [facility] and pivot transfer with RW to w/c. Pt left seated in w/c at end of session, all needs in reach.  Session Two: Pt seen for OT ADL bathing/dressing session. Pt sitting up in w/c upon arrival, agreeable to tx session and denying pain. Transfers/Mobility: Sit>stand from w/c to [employer] A. Ambulated into bathroom with min A overall with VCs for RW management and standing rest break required. Mod-max A to stand from TTB with RW and grab bar. Stand turn to w/c, strong posterior bias with mod A required to prevent LOB.  Ambulated into room with min A and RW, mod A for controlled descent onto bed and min A to return  to supine. ADL Re-training: Bathed seated on TTB, use of LH sponge to complete LB bathing. Unsafe to attempt to stand in shower today 2/2 pt's fatigue level, therefore, completed standing at sink post shower.  Returned to w/c to dress. Use of dressing stick to thread underwear and pants and stood with min-mod steadying assist for balance and assist to advance clothing completely over hips. Episode of knee buckling with pt able to correct. Total A to don TED hose and pt donned non-slid socks with use of dressing stick and increased time. Neuro re-ed: Completed peg board activity using small peg board, pt required to turn and manipulate pieces to place into peg board. Completed with increased time. Pt reports increased numbness in B hands R>L today which impacted her ability to complete ADLs at a more independent level.   Pt returned to room at end of session and transitioned back to bed, left in supine with all needs in reach and bed alarm on.   Therapy Documentation Precautions:  Precautions Precautions: Cervical Required Braces or Orthoses: Cervical Brace Cervical Brace: For comfort, Soft collar Restrictions Weight Bearing Restrictions: No   Therapy/Group: Individual Therapy  Channell Quattrone L 04/10/2019, 6:33 AM

## 2019-04-11 ENCOUNTER — Inpatient Hospital Stay (HOSPITAL_COMMUNITY): Payer: PRIVATE HEALTH INSURANCE

## 2019-04-11 ENCOUNTER — Inpatient Hospital Stay (HOSPITAL_COMMUNITY): Payer: PRIVATE HEALTH INSURANCE | Admitting: Occupational Therapy

## 2019-04-11 ENCOUNTER — Inpatient Hospital Stay (HOSPITAL_COMMUNITY): Payer: PRIVATE HEALTH INSURANCE | Admitting: Physical Therapy

## 2019-04-11 DIAGNOSIS — I10 Essential (primary) hypertension: Secondary | ICD-10-CM

## 2019-04-11 DIAGNOSIS — R791 Abnormal coagulation profile: Secondary | ICD-10-CM

## 2019-04-11 DIAGNOSIS — R739 Hyperglycemia, unspecified: Secondary | ICD-10-CM | POA: Insufficient documentation

## 2019-04-11 DIAGNOSIS — Z7901 Long term (current) use of anticoagulants: Secondary | ICD-10-CM

## 2019-04-11 DIAGNOSIS — D509 Iron deficiency anemia, unspecified: Secondary | ICD-10-CM

## 2019-04-11 DIAGNOSIS — D696 Thrombocytopenia, unspecified: Secondary | ICD-10-CM

## 2019-04-11 DIAGNOSIS — I4891 Unspecified atrial fibrillation: Secondary | ICD-10-CM

## 2019-04-11 DIAGNOSIS — G8918 Other acute postprocedural pain: Secondary | ICD-10-CM

## 2019-04-11 LAB — CBC
HCT: 33.8 % — ABNORMAL LOW (ref 36.0–46.0)
Hemoglobin: 11 g/dL — ABNORMAL LOW (ref 12.0–15.0)
MCH: 32.6 pg (ref 26.0–34.0)
MCHC: 32.5 g/dL (ref 30.0–36.0)
MCV: 100.3 fL — ABNORMAL HIGH (ref 80.0–100.0)
Platelets: 145 10*3/uL — ABNORMAL LOW (ref 150–400)
RBC: 3.37 MIL/uL — ABNORMAL LOW (ref 3.87–5.11)
RDW: 14.4 % (ref 11.5–15.5)
WBC: 8.3 10*3/uL (ref 4.0–10.5)
nRBC: 0 % (ref 0.0–0.2)

## 2019-04-11 LAB — BASIC METABOLIC PANEL
Anion gap: 9 (ref 5–15)
BUN: 36 mg/dL — ABNORMAL HIGH (ref 8–23)
CO2: 30 mmol/L (ref 22–32)
Calcium: 8.2 mg/dL — ABNORMAL LOW (ref 8.9–10.3)
Chloride: 103 mmol/L (ref 98–111)
Creatinine, Ser: 0.99 mg/dL (ref 0.44–1.00)
GFR calc Af Amer: >60 mL/min (ref >60)
GFR calc non Af Amer: 55 mL/min — ABNORMAL LOW (ref >60)
Glucose, Bld: 173 mg/dL — ABNORMAL HIGH (ref 70–99)
Potassium: 3.3 mmol/L — ABNORMAL LOW (ref 3.5–5.1)
Sodium: 142 mmol/L (ref 135–145)

## 2019-04-11 LAB — GLUCOSE, CAPILLARY
Glucose-Capillary: 124 mg/dL — ABNORMAL HIGH (ref 70–99)
Glucose-Capillary: 115 mg/dL — ABNORMAL HIGH (ref 70–99)
Glucose-Capillary: 108 mg/dL — ABNORMAL HIGH (ref 70–99)
Glucose-Capillary: 195 mg/dL — ABNORMAL HIGH (ref 70–99)

## 2019-04-11 LAB — PROTIME-INR
INR: 1.8 — ABNORMAL HIGH (ref 0.8–1.2)
Prothrombin Time: 20.8 seconds — ABNORMAL HIGH (ref 11.4–15.2)

## 2019-04-11 MED ORDER — POTASSIUM CHLORIDE CRYS ER 20 MEQ PO TBCR
40.0000 meq | EXTENDED_RELEASE_TABLET | Freq: Four times a day (QID) | ORAL | Status: AC
  Administered 2019-04-11 (×2): 40 meq via ORAL
  Filled 2019-04-11 (×2): qty 2

## 2019-04-11 MED ORDER — WARFARIN SODIUM 2 MG PO TABS
2.0000 mg | ORAL_TABLET | Freq: Once | ORAL | Status: AC
  Administered 2019-04-11: 2 mg via ORAL
  Filled 2019-04-11: qty 1

## 2019-04-11 MED ORDER — DEXAMETHASONE 4 MG PO TABS
10.0000 mg | ORAL_TABLET | Freq: Three times a day (TID) | ORAL | Status: DC
  Administered 2019-04-11 – 2019-04-19 (×24): 10 mg via ORAL
  Filled 2019-04-11 (×24): qty 3

## 2019-04-11 NOTE — Progress Notes (Signed)
Social Work Assessment and Plan   Patient Details  Name: Natasha Lawson MRN: [id number] Date of Birth: 1940/06/29  Today's Date: 04/06/2019  Problem List:  Patient Active Problem List   Diagnosis Date Noted  . Thrombocytopenia ([place])   . Iron deficiency anemia   . Postoperative pain   . Subtherapeutic international normalized ratio (INR)   . Hyperglycemia   . HTN (hypertension) 04/05/2019  . Quadriplegia ([place]) 04/05/2019  . Atrial fibrillation ([place]) 04/05/2019  . Anticoagulated on Coumadin 04/05/2019  . 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  . Cervical myelopathy ([place]) 03/29/2019   Past Medical History:  Past Medical History:  Diagnosis Date  . Anxiety   . Cancer [facility])    breast cancer right  . Dysrhythmia    A-Fib on coumadin  . GERD (gastroesophageal reflux disease)   . Gout   . 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 History of blood transfusion   . Hypertension   . Sleep apnea    uses CPAP nightly   Past Surgical History:  Past Surgical History:  Procedure Laterality Date  . ABDOMINAL HYSTERECTOMY    . BREAST SURGERY Right 08/14/2014   lumpectomy/lynph nodes  . BREAST SURGERY Right 10/05/2016   mass removed  . CARDIAC CATHETERIZATION    . CARPAL TUNNEL RELEASE Bilateral 10/01/11, 12/02/11  . CHOLECYSTECTOMY  1987  . COLONOSCOPY    . DILATION AND CURETTAGE OF UTERUS  10/10/2008   hysteroscopy with biopsy  . EYE SURGERY Bilateral    removed cataracts  . HYSTEROSCOPY WITH D & C  08/19/2011  . INCONTINENCE SURGERY  01/11/2007  . JOINT REPLACEMENT Right 10/18/2013  . laparoscopy bilateral salpingo-oophorectomy  01/27/2012  . NECK SURGERY  11/16/2017  . pci lad  01/02/2015   stent   . POSTERIOR CERVICAL FUSION/FORAMINOTOMY N/A 03/29/2019   Procedure: Cervical Five to Cervical Seven Posterior cervical laminectomy and instrumented fusion;  Surgeon: Judith Part, MD;  Location: [place];  Service: Neurosurgery;  Laterality: N/A;  Cervical Five to  Cervical Seven Posterior cervical laminectomy and instrumented fusion  . radiation breast canceer Right    from 12/08/14-01/12/15  . rotator cuff  Right 04/26/2010   repair  . TONSILLECTOMY  1954  . TUBAL LIGATION  1984  . UPPER GI ENDOSCOPY  2016   Social History:  reports that she has never smoked. She has never used smokeless tobacco. She reports that she does not drink alcohol or use drugs.  Family / Support Systems Marital Status: Married Patient Roles: Spouse, Parent Spouse/Significant Other: spouse, Natasha Lawson @ [phone] Children: daughter, Natasha Lawson [facility]) @ [phone] Anticipated Caregiver: spouse Ability/Limitations of Caregiver: spouse has torn meniscus Caregiver Availability: 24/7 Family Dynamics: Pt describes spouse and daughter as very supportive.  Social History Preferred language: English Religion:  Cultural Background: NA Read: Yes Write: Yes Employment Status: Retired Public relations account executive Issues: None Guardian/Conservator: None - per MD, pt is capable of making decisions on her own behalf.   Abuse/Neglect Abuse/Neglect Assessment Can Be Completed: Yes Physical Abuse: Denies Verbal Abuse: Denies Sexual Abuse: Denies Exploitation of patient/patient's resources: Denies Self-Neglect: Denies  Emotional Status Pt's affect, behavior and adjustment status: Pt very pleasant, talkative and pleased with progress so far.  She is frustrated with her limitations, however, positive and gains.  Denies any significant emotional distress.  Will monitor and refer for neuropsychology as indicated. Recent Psychosocial Issues: recent surgery Psychiatric History: None Substance Abuse History: None  Patient / Family Perceptions,  Expectations & Goals Pt/Family understanding of illness & functional limitations: Pt and family with good understanding of surgery performed this time and current functional limitations / need for CIR. Premorbid pt/family  roles/activities: spouse has provided needed assistance Anticipated changes in roles/activities/participation: Little change anticipated if pt able to reach supervision goals overall. Pt/family expectations/goals: Pt hopeful to regain more function and sensation with time and decreased swelling.  Community [employer] Agencies: None Premorbid Home Care/DME Agencies: Other (Comment)([facility]) Transportation available at discharge: yes  Discharge Planning Living Arrangements: Spouse/significant other Support Systems: Spouse/significant other, Children Type of Residence: Private residence Insurance Resources: [employer] Financial Resources: [place] Referred: No Living Expenses: Own Money Management: Spouse, Patient Does the patient have any problems obtaining your medications?: No Home Management: pt and spouse Patient/Family Preliminary Plans: Pt to d/c home with spouse and daughter to provide any needed assistance. Social Work Anticipated Follow Up Needs: HH/OP Expected length of stay: 14-16 days  Clinical Impression Pleasant woman on CIR following further cervical surgery.  Making steady gains and very optimistic about her overall recovery.  Good support from spouse and daughter.  Denies any significant emotional distress - will monitor.    Natasha Lawson 04/06/2019, 5:43 PM

## 2019-04-11 NOTE — Progress Notes (Signed)
Physical Therapy Session Note  Patient Details  Name: Natasha Lawson MRN: [id number] Date of Birth: 12-Jan-1941  Today's Date: 04/11/2019 PT Individual Time: 0907-0952 PT Individual Time Calculation (min): 45 min   Short Term Goals: Week 1:  PT Short Term Goal 1 (Week 1): Pt will complete bed mobility with min A PT Short Term Goal 2 (Week 1): Pt will ambulate x 100 ft with LRAD and assist x 1 PT Short Term Goal 3 (Week 1): Pt will initiate stair training  Skilled Therapeutic Interventions/Progress Updates:  Pt resting in bed.  She denied pain.  She reported numbness ankles bil.  PT tested proprioception: absent bil great toes and ankles.  Rolling R with min assist; to sit up with mod assist and mod cues for technique.  In sitting EOB, R/L lateral leans x 5.    Transfer training for forward wt shift> standing up with min assist to RW, pushing up with bil hands on bed.  Stand pivot with CGA and RW.  Pt stated "my knees are numb today too."  Seated: bil ankle DF with 2 second hold x 20 each. PT donned soft collar.   Gait training with RW on level tile x 13' , very slowly, CGA with wc follow for safety.  At end of session, pt resting in w/c with needs at hand and seat belt alarm set.       Therapy Documentationr Precautions:  Precautions Precautions: Cervical Required Braces or Orthoses: Cervical Brace Cervical Brace: For comfort, Soft collar Restrictions Weight Bearing Restrictions: No      Therapy/Group: Individual Therapy  Tyson Parkison 04/11/2019, 12:35 PM

## 2019-04-11 NOTE — Progress Notes (Signed)
Occupational Therapy Session Note  Patient Details  Name: Natasha Lawson MRN: [id number] Date of Birth: 1940/10/30  Today's Date: 04/11/2019 OT Individual Time: 1100-1202 and 1300-1339 OT Individual Time Calculation (min): 62 min and 39 mins   Short Term Goals: Week 1:  OT Short Term Goal 1 (Week 1): Pt will complete sit>stand as part of dressing task with min A OT Short Term Goal 2 (Week 1): Pt will stand to complete 1 grooming task with CGA in order to increase functional standing balance/endurance OT Short Term Goal 3 (Week 1): Pt will complete LB dressing with min A using AE PRN OT Short Term Goal 4 (Week 1): Pt will complete toileting task with mod A  Skilled Therapeutic Interventions/Progress Updates:    Session 1: Upon entering the room, pt seated in wheelchair and had obtained all clothing items and requesting shower. Pt does report increased fatigue with this session and declined to attempt ambulation into bathroom for safety. Pt transferred from wheelchair> TTB with min A overall. Pt seated and utilized LH sponge to increase I with self care tasks and standing once with min guard for safety to more thoroughly was buttocks. Pt returning to wheelchair in same manner as above and assisted to bed where she performed stand pivot transfer from wheelchair >bed with min A but significant LOB posteriorly resulting in max A for anterior weight shift. Pt seated on EOB and utilized dressing stick to increased I with self care tasks with min cuing for proper technique. Pt able to don pull over shirt independently with dressing stick. Min A for sit <>stand and mod A for LB self care secondary to fatigue. Pt returning to supine from sit with close supervision. OT provided assistance to don B TED hose and non slip socks while pt resting. Call bell and all needed items within reach and bed alarm activated.   Session 2: Upon entering the room, pt supine in bed with spouse present in room. Pt with no c/o pain  and agreeable to OT intervention.Supine>sit with supervision to EOB. OT provided paper handout and yellow resistive theraputty for R hand strengthening, coordination, and sensation. Pt participated in exercises 1-16 as listed by handout with min cuing for proper technique. Pt needing rest breaks secondary to hand fatigue but very excited over this intervention. Pt is able to place putty into container and place lid on with increased time for B hand coordination task. OT asked to pt work on this HEP with use of tray table daily. Pt scoots to the R , closer to [facility], with min A secondary to fatigue. Sit >supine with min A for R LE this session. Pt reports being comfortable in flat position with call bell and all needed items within reach. Bed alarm activated and spouse remains in room. Pt keep soft collar donned for comfort.  Therapy Documentation Precautions:  Precautions Precautions: Cervical Required Braces or Orthoses: Cervical Brace Cervical Brace: For comfort, Soft collar Restrictions Weight Bearing Restrictions: No General:   Vital Signs:  Pain: Pain Assessment Pain Scale: 0-10 Pain Score: 0-No pain   Therapy/Group: Individual Therapy  Gypsy Decant 04/11/2019, 12:25 PM

## 2019-04-11 NOTE — Progress Notes (Signed)
Physical Therapy Session Note  Patient Details  Name: Ludella Thibeault MRN: [id number] Date of Birth: [birth year]  Today's Date: 04/11/2019 PT Individual Time: 1445-1525 PT Individual Time Calculation (min): 40 min   Short Term Goals: Week 1:  PT Short Term Goal 1 (Week 1): Pt will complete bed mobility with min A PT Short Term Goal 2 (Week 1): Pt will ambulate x 100 ft with LRAD and assist x 1 PT Short Term Goal 3 (Week 1): Pt will initiate stair training  Skilled Therapeutic Interventions/Progress Updates:   Pt in supine and agreeable to therapy, no c/o pain. Supine>sit w/ min assist and mod assist sit>stand to RW. Ambulated 40' x2 w/ very slow gait speed and close w/c follow for safety, although did not need to emergently use it. Verbal cues for gait pattern and upright posture, as well as to rely less on UE support for upright w/ RW. Pt self-propelled w/c back to room w/ BUEs, slow speed w/ this as well, but able to do so w/ supervision. Upon return to room, educated pt on performing ABC B ankle ROM to work on motor control and proprioception. Pt demonstrated correctly. Ended session in w/c, all needs in reach.   Therapy Documentation Precautions:  Precautions Precautions: Cervical Required Braces or Orthoses: Cervical Brace Cervical Brace: For comfort, Soft collar Restrictions Weight Bearing Restrictions: No Vital Signs: Therapy Vitals Temp: 97.8 F (36.6 C) Pulse Rate: (!) 57 Resp: 20 BP: (!) 125/58 Patient Position (if appropriate): Sitting Oxygen Therapy SpO2: 99 % O2 Device: Room Air  Therapy/Group: Individual Therapy  Leota Maka Clent Demark 04/11/2019, 3:52 PM

## 2019-04-11 NOTE — Progress Notes (Signed)
[place] PHYSICAL MEDICINE & REHABILITATION PROGRESS NOTE   Subjective/Complaints: Patient seen sitting up in bed this morning.  She states she slept well overnight.  She states she feels much better this morning with improving numbness in her hands.  She has questions regarding anticoagulation and steroids.  ROS: + Improving numbness in hands.  Denies CP, shortness of breath, nausea, vomiting, diarrhea.   Objective:   No results found. Recent Labs    04/10/19 0657  WBC 10.0  HGB 11.0*  HCT 32.7*  PLT 148*   No results for input(s): NA, K, CL, CO2, GLUCOSE, BUN, CREATININE, CALCIUM in the last 72 hours.  Intake/Output Summary (Last 24 hours) at 04/11/2019 0812 Last data filed at 04/11/2019 0700 Gross per 24 hour  Intake 1200 ml  Output 250 ml  Net 950 ml     Physical Exam: Vital Signs Blood pressure (!) 154/70, pulse (!) 56, temperature 97.7 F (36.5 C), temperature source Oral, resp. rate 18, height 5\' 7"  (1.702 m), weight 111.1 kg, SpO2 99 %. Constitutional: No distress . Vital signs reviewed.  Morbidly obese. HENT: Normocephalic.  Atraumatic. Eyes: EOMI. No discharge. Cardiovascular: No JVD. Respiratory: Normal effort.  No stridor. GI: Non-distended. Skin: Incision with dressing C/C/I Psych: Normal mood.  Normal behavior. Musc: Lower extremity edema. Neuro: Alert Motor: Grossly 4-4+/5 throughout Sensation diminished to light touch in bilateral hands  Assessment/Plan: 1. 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  Physiatrist is providing close team supervision and 24 hour management of active medical problems listed below.  Physiatrist and rehab team continue to assess barriers to discharge/monitor patient progress toward functional and medical goals  Care Tool:  Bathing    Body parts  bathed by patient: Right arm, Left arm, Abdomen, Chest, Right upper leg, Left upper leg, Face, Front perineal area, Buttocks, Right lower leg, Left lower leg   Body parts bathed by helper: Front perineal area, Buttocks, Right lower leg, Left lower leg     Bathing assist Assist Level: Contact Guard/Touching assist     Upper Body Dressing/Undressing Upper body dressing   What is the patient wearing?: Pull over shirt    Upper body assist Assist Level: Independent with assistive device Assistive Device Comment: dressing stick  Lower Body Dressing/Undressing Lower body dressing      What is the patient wearing?: Pants, Incontinence brief     Lower body assist Assist for lower body dressing: Minimal Assistance - Patient > 75%(dressing stick)     Toileting Toileting    Toileting assist Assist for toileting: Minimal Assistance - Patient > 75%     Transfers Chair/bed transfer  Transfers assist     Chair/bed transfer assist level: Minimal Assistance - Patient > 75%     Locomotion Ambulation   Ambulation assist      Assist level: Minimal Assistance - Patient > 75% Assistive device: Walker-rolling Max distance: 15 ft   Walk 10 feet activity   Assist     Assist level: Minimal Assistance - Patient > 75% Assistive device: Walker-rolling   Walk 50 feet activity   Assist    Assist level: 2 helpers Assistive device: Walker-rolling    Walk 150 feet activity   Assist Walk 150 feet activity did not occur: Safety/medical concerns         Walk 10 feet on uneven surface  activity   Assist Walk  10 feet on uneven surfaces activity did not occur: Safety/medical concerns         Wheelchair     Assist Will patient use wheelchair at discharge?: No Type of Wheelchair: Manual    Wheelchair assist level: Supervision/Verbal cueing Max wheelchair distance: 150'    Wheelchair 50 feet with 2 turns activity    Assist        Assist Level:  Supervision/Verbal cueing   Wheelchair 150 feet activity     Assist      Assist Level: Supervision/Verbal cueing   Blood pressure (!) 154/70, pulse (!) 56, temperature 97.7 F (36.5 C), temperature source Oral, resp. rate 18, height 5\' 7"  (1.702 m), weight 111.1 kg, SpO2 99 %.  Medical Problem List and Plan: 1.  Impaired mobility and ADLs secondary to cervical myelopathy            Continue CIR 2.  Antithrombotics: -DVT/anticoagulation:  Pharmaceutical: Cont Heparin               Restarted Coumadin home dose of 3mg  Mondays and Fridays on 12/25 with a goal of 2-3.   INR remained subtherapeutic on 12/27, labs pending for today  -antiplatelet therapy: ASA 3. Pain Management:  Oxycodone prn  Flexeril changed to Skelaxin 800 mg TID prn for muscle spasms/tightness due to lethargy  Added Lyrica 25mg  for burning pain in right hand and right leg on 12/27 with improvement 4. Mood: LCSW to follow for evaluation and support.              -antipsychotic agents: NA 5. Neuropsych: This patient is capable of making decisions on her own behalf. 6. Skin/Wound Care: routine pressure relief measures.  7. Fluids/Electrolytes/Nutrition: Monitor I/O. 8.  Subdural/intradural collection with cord compression: IV decadron 10 mg every 4 hours changed to p.o. every 6 hours on 12/22, decreased to 3 times daily on 12/28 9. Hyperglycemia: has h/o diet controlled DM in the past. Will monitor BS ac/hs as now on steroids.   Confounded by steroid-induced hyperglycemia-labile on 12/28 10.  OSA: continue CPAP at nights.  12/22- pt refusing- said wants 2L O2 at night.   11. PAF: On Bystolic bid.  Monitor HR TID--continue to hold coumadin.  12. H/o iron deficiency anemia:  Hemoglobin 11.0 on 12/27, continue to monitor 13. H/o Anxiety disorder: Has been stable on Zoloft.  14. Hypokalemia: Kdur increased to 40 meq bid, DC'd on 12/25  Potassium 3.9 on 12/25, continue to monitor  15. Thrombocytopenia:   Platelets  148 on 12/27, continue to monitor 16. HTN/Peripheral edema: On Demadex daily   Labile on 12/28 17. Atrial fibrillation: Coumadin restarted 12/25 (see #2)      LOS: 7 days A FACE TO FACE EVALUATION WAS PERFORMED  Ankit Lorie Phenix 04/11/2019, 8:12 AM

## 2019-04-11 NOTE — Progress Notes (Signed)
ANTICOAGULATION CONSULT NOTE - Initial Consult  Pharmacy Consult for warfarin Indication: atrial fibrillation (CHADS2VASc = 4)  Patient Measurements: Height: 5\' 7"  (170.2 cm) Weight: 244 lb 14.9 oz (111.1 kg) IBW/kg (Calculated) : 61.6  Vital Signs: Temp: 97.7 F (36.5 C) (12/28 0508) Temp Source: Oral (12/28 0508) BP: 154/70 (12/28 0508) Pulse Rate: 56 (12/28 0508)  Labs: Recent Labs    04/09/19 0731 04/10/19 0657 04/11/19 0754  HGB  --  11.0* 11.0*  HCT  --  32.7* 33.8*  PLT  --  148* 145*  LABPROT 14.9 17.1* 20.8*  INR 1.2 1.4* 1.8*  CREATININE  --   --  0.99    Estimated Creatinine Clearance: 62.1 mL/min (by C-G formula based on SCr of 0.99 mg/dL).   Medical History:    Assessment: 89 yoF with AFib on warfarin PTA admitted with cervical laminectomy. Per neurology, pharmacy can resume warfarin on POD#10 (12/25). Nursing spoke with patient and she states PTA she was taking Warfarin 2 mg PO daily. INR starting to rise today expect to continue tomorrow. H/H & plt stable.  Goal of Therapy:  INR 2-3 Monitor platelets by anticoagulation protocol: Yes   Plan:  -Will give Warfarin 2 mg PO x 1 dose tonight  -Stop heparin when INR > 2 -Daily Pt-INR -Monitor patient for s/s of bleeding  Benetta Spar, PharmD, BCPS, BCCP Clinical Pharmacist  Please check AMION for all [place] phone numbers After 10:00 PM, call [place] [phone]

## 2019-04-11 NOTE — Care Management (Signed)
[facility] Individual Statement of Services  Patient Name:  Natasha Lawson  Date:  04/07/2019  Welcome to the [facility].  Our goal is to provide you with an individualized program based on your diagnosis and situation, designed to meet your specific needs.  With this comprehensive rehabilitation program, you will be expected to participate in at least 3 hours of rehabilitation therapies Monday-Friday, with modified therapy programming on the weekends.  Your rehabilitation program will include the following services:  Physical Therapy (PT), Occupational Therapy (OT), 24 hour per day rehabilitation nursing, Therapeutic Recreaction (TR), Case Management (Social Worker), Rehabilitation Medicine, Nutrition Services and Pharmacy Services  Weekly team conferences will be held on Tuesdays to discuss your progress.  Your Social Worker will talk with you frequently to get your input and to update you on team discussions.  Team conferences with you and your family in attendance may also be held.  Expected length of stay: 14-16 days   Overall anticipated outcome: supervision  Depending on your progress and recovery, your program may change. Your Social Worker will coordinate services and will keep you informed of any changes. Your Social Worker's name and contact numbers are listed  below.  The following services may also be recommended but are not provided by the [facility] will be made to provide these services after discharge if needed.  Arrangements include referral to agencies that provide these services.  Your insurance has been verified to be:  Medicare; generic Your primary doctor is:  Currie Paris  Pertinent information will be shared with your doctor and your insurance company.  Social Worker:  [place], [place] or  [phone]   Information discussed with and copy given to patient by: Lennart Pall, 04/07/2019, 5:47 PM

## 2019-04-12 ENCOUNTER — Inpatient Hospital Stay (HOSPITAL_COMMUNITY): Payer: PRIVATE HEALTH INSURANCE | Admitting: Occupational Therapy

## 2019-04-12 ENCOUNTER — Inpatient Hospital Stay (HOSPITAL_COMMUNITY): Payer: PRIVATE HEALTH INSURANCE

## 2019-04-12 ENCOUNTER — Inpatient Hospital Stay (HOSPITAL_COMMUNITY): Payer: PRIVATE HEALTH INSURANCE | Admitting: Physical Therapy

## 2019-04-12 LAB — GLUCOSE, CAPILLARY
Glucose-Capillary: 129 mg/dL — ABNORMAL HIGH (ref 70–99)
Glucose-Capillary: 152 mg/dL — ABNORMAL HIGH (ref 70–99)
Glucose-Capillary: 124 mg/dL — ABNORMAL HIGH (ref 70–99)
Glucose-Capillary: 230 mg/dL — ABNORMAL HIGH (ref 70–99)

## 2019-04-12 LAB — PROTIME-INR
INR: 2 — ABNORMAL HIGH (ref 0.8–1.2)
Prothrombin Time: 23 seconds — ABNORMAL HIGH (ref 11.4–15.2)

## 2019-04-12 MED ORDER — WARFARIN SODIUM 2 MG PO TABS
2.0000 mg | ORAL_TABLET | Freq: Once | ORAL | Status: AC
  Administered 2019-04-12: 2 mg via ORAL
  Filled 2019-04-12: qty 1

## 2019-04-12 NOTE — Patient Care Conference (Signed)
Inpatient RehabilitationTeam Conference and Plan of Care Update Date: 04/12/2019   Time: 11:15 AM    Patient Name: Natasha Lawson      Medical Record Number: [id number]  Date of Birth: February 05, 1941 Sex: Female         Room/Bed: 4M11C/4M11C-01 Payor Info: Payor: MEDICARE / Plan: MEDICARE PART A AND B / Product Type: *No Product type* /    Admit Date/Time:  04/04/2019  3:32 PM  Primary Diagnosis:  Cervical arthritis with myelopathy  Patient Active Problem List   Diagnosis Date Noted  . Thrombocytopenia ([place])   . Iron deficiency anemia   . Postoperative pain   . Subtherapeutic international normalized ratio (INR)   . Hyperglycemia   . HTN (hypertension) 04/05/2019  . Quadriplegia ([place]) 04/05/2019  . Atrial fibrillation ([place]) 04/05/2019  . Anticoagulated on Coumadin 04/05/2019  . 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  . Cervical myelopathy ([place]) 03/29/2019    Expected Discharge Date: Expected Discharge Date: 04/20/19  Team Members Present: Physician leading conference: Dr. Courtney Heys Social Worker Present: Lennart Pall, LCSW Nurse Present: Other (comment)(Susan Truman Hayward, RN) Case manager: Karene Fry, RN PT Present: Canary Brim, PT OT Present: Amy Rounds, OT SLP Present: Weston [name], SLP PPS Coordinator present : Ileana Ladd, Burna Mortimer, SLP     Current Status/Progress Goal Weekly Team Focus  Bowel/Bladder   Continent of bowel and bladder  Maintain continence  Assess for toileting needs q 2 to 4 hrs and as needed   Swallow/Nutrition/ Hydration             ADL's   min A sit <>stand, min A functional transfers with RW, mod A LB self care, mod I with use of AE for UB self care, min - mod A for toileting. Use of LH sponge and dressing stick, increased fatigue, posterior bias with fatigue  Supervision overall, min A shower transfers  ADL retraining, functional transfers, NMR, standing balance/endurance, pt/family education   Mobility   min-mod assist sit>stand,  CGA-min assist once up to RW, up to 50' w/ RW  supervision overall, 150' gait  endurance, balance/proprioception, gait, BLE NMR   Communication             Safety/Cognition/ Behavioral Observations            Pain   patient denies any kind of pain  or discomfort  Remains pain free  Assess pain q 4 hrs and as needed   Skin   Skin is warm, dry and intact, but scattered brusing  Maintain Skin intergrity  Assess skin q shift    Rehab Goals Patient on target to meet rehab goals: Yes *See Care Plan and progress notes for long and short-term goals.     Barriers to Discharge  Current Status/Progress Possible Resolutions Date Resolved   Nursing                  PT                    OT                  SLP                SW                Discharge Planning/Teaching Needs:  Home with spouse and daughter provide 24/7 assistance.  Teaching to be planned prior to d/c   Team Discussion: Has numbness, stopped lyrica, K+ repleated, BUN  elevated, push fluids.  RN - cont B/B.  OT min/mod sit to stand, close S/CGA to amb, progressing well, husband having surgery next week.  PT min/mod sit to stand, CGA/min A amb 50' RW, goals S.   Revisions to Treatment Plan: N/A     Medical Summary Current Status: refusing CPAP; min-mod assist sit-stand; close CGA for balance walking short distances; progressing; pt moving better than husband- hubby surgery next week Weekly Focus/Goal: min-mod assist- transfers 41 ft RW; goals supervision; needs W/C; no equipment from OT  Barriers to Discharge: Decreased family/caregiver support;Home enviroment access/layout;Medical stability;Weight;Other (comments)  Barriers to Discharge Comments: on coumadin/heparin gtt Possible Resolutions to Barriers: need her sup to mod I   Continued Need for Acute Rehabilitation Level of Care: The patient requires daily medical management by a physician with specialized training in physical medicine and rehabilitation for the  following reasons: Direction of a multidisciplinary physical rehabilitation program to maximize functional independence : Yes Medical management of patient stability for increased activity during participation in an intensive rehabilitation regime.: Yes Analysis of laboratory values and/or radiology reports with any subsequent need for medication adjustment and/or medical intervention. : Yes   I attest that I was present, lead the team conference, and concur with the assessment and plan of the team.   Jodell Cipro M 04/13/2019, 12:04 PM  Team conference was held via web/ teleconference due to [place] - 19

## 2019-04-12 NOTE — Plan of Care (Signed)
  Problem: Consults Goal: RH SPINAL CORD INJURY PATIENT EDUCATION Description:  See Patient Education module for education specifics.  Outcome: Progressing Goal: Skin Care Protocol Initiated - if Braden Score 18 or less Description: If consults are not indicated, leave blank or document N/A Outcome: Progressing   Problem: SCI BOWEL ELIMINATION Goal: RH STG MANAGE BOWEL WITH ASSISTANCE Description: STG Manage Bowel with min Assistance. Outcome: Progressing   Problem: SCI BLADDER ELIMINATION Goal: RH STG MANAGE BLADDER WITH ASSISTANCE Description: STG Manage Bladder With min Assistance Outcome: Progressing   Problem: RH SAFETY Goal: RH STG ADHERE TO SAFETY PRECAUTIONS W/ASSISTANCE/DEVICE Description: STG Adhere to Safety Precautions With mod I Assistance/Device. Outcome: Progressing Goal: RH STG DECREASED RISK OF FALL WITH ASSISTANCE Description: STG Decreased Risk of Fall With mod I Assistance. Outcome: Progressing   Problem: RH SKIN INTEGRITY Goal: RH STG SKIN FREE OF INFECTION/BREAKDOWN Description: Patients skin will remain free from further infection or breakdown with mod I assist. Outcome: Progressing Goal: RH STG MAINTAIN SKIN INTEGRITY WITH ASSISTANCE Description: STG Maintain Skin Integrity With mod I Assistance. Outcome: Progressing   Problem: RH PAIN MANAGEMENT Goal: RH STG PAIN MANAGED AT OR BELOW PT'S PAIN GOAL Description: < 4 Outcome: Progressing

## 2019-04-12 NOTE — Progress Notes (Signed)
Natasha Lawson PHYSICAL MEDICINE & REHABILITATION PROGRESS NOTE   Subjective/Complaints:  Pt doesn't want Lyrica anymore- hasn't taken x 2 days. Says only having numbness, not nerve pain.  Numbness In hands, knees, ankles and feet B/L   ROS: + Improving numbness in hands.  Denies CP, shortness of breath, nausea, vomiting, diarrhea.   Objective:   No results found. Recent Labs    04/10/19 0657 04/11/19 0754  WBC 10.0 8.3  HGB 11.0* 11.0*  HCT 32.7* 33.8*  PLT 148* 145*   Recent Labs    04/11/19 0754  NA 142  K 3.3*  CL 103  CO2 30  GLUCOSE 173*  BUN 36*  CREATININE 0.99  CALCIUM 8.2*    Intake/Output Summary (Last 24 hours) at 04/12/2019 0859 Last data filed at 04/12/2019 0737 Gross per 24 hour  Intake 1060 ml  Output 300 ml  Net 760 ml     Physical Exam: Vital Signs Blood pressure (!) 156/57, pulse (!) 54, temperature 97.6 F (36.4 C), resp. rate 18, height 5\' 7"  (1.702 m), weight 111.1 kg, SpO2 99 %. Constitutional: No distress . Vital signs reviewed.  Morbidly obese. Lying in bed, stretching, NAD HENT: Normocephalic.  Atraumatic. Eyes: EOMI. No discharge. Cardiovascular: No JVD. Respiratory: Normal effort.  No stridor. GI: Non-distended. Skin: Incision with dressing C/C/I Psych: Normal mood.  Normal behavior. Musc: Lower extremity edema. Neuro: Alert Motor: Grossly 4-4+/5 throughout Sensation diminished to light touch in bilateral hands  Assessment/Plan: 1. 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  Physiatrist is providing close team supervision and 24 hour management of active medical problems listed below.  Physiatrist and rehab team continue to assess barriers to discharge/monitor patient progress toward functional and medical goals  Care Tool:  Bathing    Body parts  bathed by patient: Right arm, Left arm, Abdomen, Chest, Right upper leg, Left upper leg, Face, Front perineal area, Buttocks, Right lower leg, Left lower leg   Body parts bathed by helper: Front perineal area, Buttocks, Right lower leg, Left lower leg     Bathing assist Assist Level: Contact Guard/Touching assist     Upper Body Dressing/Undressing Upper body dressing   What is the patient wearing?: Pull over shirt    Upper body assist Assist Level: Independent with assistive device Assistive Device Comment: dressing stick  Lower Body Dressing/Undressing Lower body dressing      What is the patient wearing?: Underwear/pull up, Pants     Lower body assist Assist for lower body dressing: Minimal Assistance - Patient > 75%     Toileting Toileting    Toileting assist Assist for toileting: Minimal Assistance - Patient > 75%     Transfers Chair/bed transfer  Transfers assist     Chair/bed transfer assist level: Contact Guard/Touching assist     Locomotion Ambulation   Ambulation assist      Assist level: Contact Guard/Touching assist Assistive device: Walker-rolling Max distance: 50'   Walk 10 feet activity   Assist     Assist level: Contact Guard/Touching assist Assistive device: Walker-rolling   Walk 50 feet activity   Assist    Assist level: Contact Guard/Touching assist Assistive device: Walker-rolling    Walk 150 feet activity   Assist Walk 150 feet activity did not occur: Safety/medical concerns         Walk 10 feet on uneven surface  activity  Assist Walk 10 feet on uneven surfaces activity did not occur: Safety/medical concerns         Wheelchair     Assist Will patient use wheelchair at discharge?: No Type of Wheelchair: Manual    Wheelchair assist level: Supervision/Verbal cueing Max wheelchair distance: 100'    Wheelchair 50 feet with 2 turns activity    Assist        Assist Level: Supervision/Verbal  cueing   Wheelchair 150 feet activity     Assist      Assist Level: Supervision/Verbal cueing   Blood pressure (!) 156/57, pulse (!) 54, temperature 97.6 F (36.4 C), resp. rate 18, height 5\' 7"  (1.702 m), weight 111.1 kg, SpO2 99 %.  Medical Problem List and Plan: 1.  Impaired mobility and ADLs secondary to cervical myelopathy            Continue CIR 2.  Antithrombotics: -DVT/anticoagulation:  Pharmaceutical: Cont Heparin               Restarted Coumadin home dose of 3mg  Mondays and Fridays on 12/25 with a goal of 2-3.   INR remained subtherapeutic on 12/27, labs pending for today  12/29- INR 2.0  -antiplatelet therapy: ASA 3. Pain Management:  Oxycodone prn  Flexeril changed to Skelaxin 800 mg TID prn for muscle spasms/tightness due to lethargy  Added Lyrica 25mg  for burning pain in right hand and right leg on 12/27 with improvement  12/29- pt refusing Lyrica- will stop for now. 4. Mood: LCSW to follow for evaluation and support.              -antipsychotic agents: NA 5. Neuropsych: This patient is capable of making decisions on her own behalf. 6. Skin/Wound Care: routine pressure relief measures.  7. Fluids/Electrolytes/Nutrition: Monitor I/O. 8.  Subdural/intradural collection with cord compression: IV decadron 10 mg every 4 hours changed to p.o. every 6 hours on 12/22, decreased to 3 times daily on 12/28 9. Hyperglycemia: has h/o diet controlled DM in the past. Will monitor BS ac/hs as now on steroids. CBG (last 3)  Recent Labs    04/11/19 1719 04/11/19 2118 04/12/19 0607  GLUCAP 108* 195* 129*      Confounded by steroid-induced hyperglycemia-labile on 12/29- 1 out of normal- 195 10.  OSA: continue CPAP at nights.  12/22- pt refusing- said wants 2L O2 at night.   11. PAF: On Bystolic bid.  Monitor HR TID--continue to hold coumadin.  12. H/o iron deficiency anemia:  Hemoglobin 11.0 on 12/27, continue to monitor 13. H/o Anxiety disorder: Has been stable on Zoloft.   14. Hypokalemia: Kdur increased to 40 meq bid, DC'd on 12/25  Potassium 3.9 on 12/25, continue to monitor   12/29- K+ was 3.3 on 12/28- -was repleted 12/28 15. Thrombocytopenia:   Platelets 148 on 12/27, continue to monitor 16. HTN/Peripheral edema: On Demadex daily   Labile on 12/28 17. Atrial fibrillation: Coumadin restarted 12/25 (see #2)      LOS: 8 days A FACE TO FACE EVALUATION WAS PERFORMED  Natasha Lawson 04/12/2019, 8:59 AM

## 2019-04-12 NOTE — Progress Notes (Signed)
Occupational Therapy Weekly Progress Note  Patient Details  Name: Natasha Lawson MRN: [id number] Date of Birth: October 23, 1940  Beginning of progress report period: April 05, 2019 End of progress report period: April 12, 2019  Today's Date: 04/12/2019 OT Individual Time: 1300-1415 OT Individual Time Calculation (min): 75 min    Patient has met 4 of 4 short term goals.  Pt is making steady progress towards OT goals. She is able to complete sit>stand with CGA-mod A depending on surface height and pt's fatigue level. Short distance functional ambulation with RW overall CGA. She used AE for LB and UB dressing, steadying assist for LB dressing when standing to complete clothing management and mod I UB dressing.  She cont to be most limited by decreased functional activity tolerance and generalized weakness which significantly increases her fall risk during functional mobility/tasks when fatigued. Pt with good awareness of deficits and able to initiate the need for seated rest breaks.   Patient continues to demonstrate the following deficits: muscle weakness and muscle paralysis, decreased cardiorespiratoy endurance and decreased sitting balance, decreased standing balance, decreased postural control and decreased balance strategies and therefore will continue to benefit from skilled OT intervention to enhance overall performance with BADL and Reduce care partner burden.  Patient progressing toward long term goals..  Continue plan of care.  OT Short Term Goals Week 1:  OT Short Term Goal 1 (Week 1): Pt will complete sit>stand as part of dressing task with min A OT Short Term Goal 1 - Progress (Week 1): Met OT Short Term Goal 2 (Week 1): Pt will stand to complete 1 grooming task with CGA in order to increase functional standing balance/endurance OT Short Term Goal 2 - Progress (Week 1): Met OT Short Term Goal 3 (Week 1): Pt will complete LB dressing with min A using AE PRN OT Short Term Goal 3 -  Progress (Week 1): Met OT Short Term Goal 4 (Week 1): Pt will complete toileting task with mod A OT Short Term Goal 4 - Progress (Week 1): Met Week 2:  OT Short Term Goal 1 (Week 2): STG=LTG due to LOS  Skilled Therapeutic Interventions/Progress Updates:    PT seen for OT session focusing on ADL re-training and functional mobility and neuro re-ed with UEs. Pt in supine upon arrival, agreeable to tx session and denying pain. Transfers: Supine>sitting EOB with supervision using hospital bed functions. Stand pivot transfer EOB> heavy duty BSC with RW and close supervision. Pt returned to room at end of session, min A short distance ambulation to bed and mod A to return to supine.  ADL Re-training: Toileting task completed with steadying assist, use of toilet aid for buttock and pericare hygiene with VCs for problem solving. Seated on BSC< donned new underwear with significantly increased time and use of dressing stick. Stood with RW and min A for standing balance with assist to advance entirely over hips. Dnnned pants with mod A  Due to fatigue. Stood with min-mod A for steadying assist to pull pants up.  Neuro Re-ed: She self propelled w/c ~68f for UE strengthening.  Completed stereognosis and in-hand manipulation skills from w/c level, pt able to correctly identify 5/6 objects in hand.   Pt left in supine with all needs in reach.  Education/dressiton throughout session regarding d/c planning, believe pt will be safer and more independent from a w/c level at home, pt voiced understanding and agreement.   Therapy Documentation Precautions:  Precautions Precautions: Cervical Required Braces or Orthoses: Cervical  Brace Cervical Brace: For comfort, Soft collar Restrictions Weight Bearing Restrictions: No   Therapy/Group: Individual Therapy  Wess Baney L 04/12/2019, 7:13 AM

## 2019-04-12 NOTE — Progress Notes (Signed)
Occupational Therapy Session Note  Patient Details  Name: Natasha Lawson MRN: [id number] Date of Birth: 09-11-1940  Today's Date: 04/12/2019 OT Individual Time: [id number] Individual Time Calculation (min): 28 min    Short Term Goals: Week 2:  OT Short Term Goal 1 (Week 2): STG=LTG due to LOS  Skilled Therapeutic Interventions/Progress Updates:    Upon entering the room, pt seated on BSC attempting to have BM. Pt reports being fatigued but no c/o pain this session. Pt standing from [facility] with min A and use of RW but she was only able to void urine although she thought she had BM. Pt required min A for hygiene and min A for balance during clothing management. Pt transferred with min A and use of RW back to bed. Pt changing clothing and into nightgown for bed with min A to pull over head. Pt returned to supine with min A for R LE. Call bell and all needed items within reach and bed alarm activated.   Therapy Documentation Precautions:  Precautions Precautions: Cervical Required Braces or Orthoses: Cervical Brace Cervical Brace: For comfort, Soft collar Restrictions Weight Bearing Restrictions: No Vital Signs: Therapy Vitals Temp: 98 F (36.7 C) Temp Source: Oral Pulse Rate: (!) 58 Resp: 18 BP: (!) 145/78 Patient Position (if appropriate): Lying Oxygen Therapy SpO2: 94 % O2 Device: Room Air   Therapy/Group: Individual Therapy  Gypsy Decant 04/12/2019, 4:28 PM

## 2019-04-12 NOTE — Progress Notes (Addendum)
ANTICOAGULATION CONSULT NOTE - Initial Consult  Pharmacy Consult for warfarin Indication: atrial fibrillation (CHADS2VASc = 4)  Patient Measurements: Height: 5\' 7"  (170.2 cm) Weight: 244 lb 14.9 oz (111.1 kg) IBW/kg (Calculated) : 61.6  Vital Signs: Temp: 97.6 F (36.4 C) (12/29 0336) BP: 156/57 (12/29 0336) Pulse Rate: 54 (12/29 0336)  Labs: Recent Labs    04/10/19 0657 04/11/19 0754 04/12/19 0538  HGB 11.0* 11.0*  --   HCT 32.7* 33.8*  --   PLT 148* 145*  --   LABPROT 17.1* 20.8* 23.0*  INR 1.4* 1.8* 2.0*  CREATININE  --  0.99  --     Estimated Creatinine Clearance: 62.1 mL/min (by C-G formula based on SCr of 0.99 mg/dL).  Assessment: 29 yoF with AFib on warfarin PTA admitted with cervical laminectomy. Per neurology, pharmacy can resume warfarin on POD#10 (12/25). Nursing spoke with patient and she states PTA she was taking Warfarin 2 mg PO daily.   INR 2.0 at goal. H/H & plt stable. Will continue home dose of 2mg . If continues to be stable, consider scheduling home dose and decrease INR frequency.  Goal of Therapy:  INR 2-3 Monitor platelets by anticoagulation protocol: Yes   Plan:  -Warfarin 2 mg PO x 1 dose tonight  -Discontinue SQ heparin  -Daily Pt-INR until stable  -Monitor patient for s/s of bleeding  Benetta Spar, PharmD, BCPS, BCCP Clinical Pharmacist  Please check AMION for all [facility] phone numbers After 10:00 PM, call [place] [phone]

## 2019-04-12 NOTE — Progress Notes (Addendum)
Physical Therapy Weekly Progress Note  Patient Details  Name: Natasha Lawson MRN: [id number] Date of Birth: 07-08-1940  Beginning of progress report period: April 05, 2019 End of progress report period: April 12, 2019  Today's Date: 04/12/2019 PT Individual Time: 0907-1002 PT Individual Time Calculation (min): 55 min   Patient has met 2 of 3 short term goals.  Pt is making progress towards LTG's. Pt currently requires CGA<>min assist overall for short distance gait with RW. Pt has initiated stair training but would benefit from ongoing practice as pt has 2 STE with R rail. Pt demonstrates impaired standing balance and overall BLE coordination. Pt would benefit from continued skilled PT treatment to focus on bed mobility, transfers, gait, balance, stair negotiation, and progress independence with all mobility prior to d/c.  Patient continues to demonstrate the following deficits muscle weakness, decreased cardiorespiratory endurance, decreased coordination, and decreased sitting balance, decreased standing balance, decreased postural control, decreased balance strategies and difficulty maintaining precautions and therefore will continue to benefit from skilled PT intervention to increase functional independence with mobility.  Patient progressing toward long term goals..  Continue plan of care.  PT Short Term Goals Week 1:  PT Short Term Goal 1 (Week 1): Pt will complete bed mobility with min A PT Short Term Goal 1 - Progress (Week 1): Met PT Short Term Goal 2 (Week 1): Pt will ambulate x 100 ft with LRAD and assist x 1 PT Short Term Goal 2 - Progress (Week 1): Progressing toward goal PT Short Term Goal 3 (Week 1): Pt will initiate stair training PT Short Term Goal 3 - Progress (Week 1): Met Week 2:  PT Short Term Goal 1 (Week 2): STG = LTG due to estimated d/c date.  Skilled Therapeutic Interventions/Progress Updates:  Pt received in bed & agreeable to tx, no c/o pain reported. Pt  transfers supine>sitting EOB with HOB elevated, bed rails & supervision. Pt is able to place pads in underwear with supervision for sitting balance and therapist assists pt with threading underwear & pants on RLE. Pt with intermittent posterior lean but able to correct with UE and bed rails and cuing. Pt transfers sit>stand with min assist and RW and assists with pulling underwear & pants over R hip with therapist performing on L. Pt transfers bed>w/c with RW & min assist via stand pivot with extra time. Pt set up at sink & performs grooming tasks (brush teeth, comb hair) without assistance. Therapist dons soft collar on pt for comfort & pt able to recognize & correct when she should not be turning head to maintain cervical precautions. Pt propels w/c room>gym with BUE & supervision with extra time and pt able to correct veering R. Pt transfers sit<>stand with min assist & ambulates 30 ft + 28 ft with RW & min assist with cuing for decreased speed with turns and pt with impaired coordination BLE. Pt performs standing taps to colored targets on floor with BUE support on RW & min assist with task focusing on weight shifting L<>R, BLE coordination, and standing balance. Back in room, pt transfers w/c>bed with RW & min assist via stand pivot. Pt transfers sit>supine with min assist for RLE & extra time. Pt left in bed with alarm set & call bell, all needs in reach.  Therapy Documentation Precautions:  Precautions Precautions: Cervical Required Braces or Orthoses: Cervical Brace Cervical Brace: For comfort, Soft collar Restrictions Weight Bearing Restrictions: No    Therapy/Group: Individual Therapy  Waunita Schooner 04/12/2019,  10:47 AM

## 2019-04-12 NOTE — Progress Notes (Signed)
Physical Therapy Session Note  Patient Details  Name: Natasha Lawson MRN: [id number] Date of Birth: [birth year]  Today's Date: 04/12/2019 PT Individual Time: 1100-1130 PT Individual Time Calculation (min): 30 min   Short Term Goals: Week 2:  PT Short Term Goal 1 (Week 2): STG = LTG due to estimated d/c date.  Skilled Therapeutic Interventions/Progress Updates:     Patient in bed with her husband in the room upon PT arrival. Patient alert and agreeable to PT session. Patient denied pain during session. Patient reported that she was fatigued and stated she would like to work on seated activities that would work on her hand and LE coordination.   Therapeutic Activity: Bed Mobility: Patient performed supine to/from sit with min A for trunk and LE managment. Provided verbal cues for performing log rolling to maintain cervical precautions, setting her bottom elbow and pushing up to her elbow then hand to sit up. She did well coming up to her elbow with CGA, but required min A for pushing up to her hand.   Neuromuscular Re-ed: Patient performed writing A-D with her R foot then switched to writing 1-10 with B feet while seated EOB with CGA-min A due to posterior lean in sitting. Switched to numbers and the patient stated she has done letters several times.  Patient picked up and sorted colored pencils on a try table in front of her arranging them in one pile and all facing the same direction seat EOB. Required increased time and 2 trials for picking up 3/12 colored pencils. Patient performed hip flexion seated EOB with min A on R x5 and full AROM on L x10 with visual target for knee height to increased ROM and motor activation. Required cues for erect posture and reduced posterior lean with activity.  Patient in bed with her husband in the room at end of session with breaks locked, bed alarm set, and all needs within reach. Encouraged patient to sit up for lunch, patient in agreement. Educated on general  recovery from spinal surgery and present symptoms throughout session.    Therapy Documentation Precautions:  Precautions Precautions: Cervical Required Braces or Orthoses: Cervical Brace Cervical Brace: For comfort, Soft collar Restrictions Weight Bearing Restrictions: No    Therapy/Group: Individual Therapy  Millissa Deese L Tequan Redmon 04/12/2019, 3:50 PM

## 2019-04-13 ENCOUNTER — Inpatient Hospital Stay (HOSPITAL_COMMUNITY): Payer: PRIVATE HEALTH INSURANCE | Admitting: Occupational Therapy

## 2019-04-13 ENCOUNTER — Inpatient Hospital Stay (HOSPITAL_COMMUNITY): Payer: PRIVATE HEALTH INSURANCE | Admitting: Physical Therapy

## 2019-04-13 LAB — CBC WITH DIFFERENTIAL/PLATELET
Abs Immature Granulocytes: 0.37 10*3/uL — ABNORMAL HIGH (ref 0.00–0.07)
Basophils Absolute: 0 10*3/uL (ref 0.0–0.1)
Basophils Relative: 0 %
Eosinophils Absolute: 0 10*3/uL (ref 0.0–0.5)
Eosinophils Relative: 0 %
HCT: 38.3 % (ref 36.0–46.0)
Hemoglobin: 12.3 g/dL (ref 12.0–15.0)
Immature Granulocytes: 3 %
Lymphocytes Relative: 2 %
Lymphs Abs: 0.2 10*3/uL — ABNORMAL LOW (ref 0.7–4.0)
MCH: 33.2 pg (ref 26.0–34.0)
MCHC: 32.1 g/dL (ref 30.0–36.0)
MCV: 103.2 fL — ABNORMAL HIGH (ref 80.0–100.0)
Monocytes Absolute: 0.5 10*3/uL (ref 0.1–1.0)
Monocytes Relative: 4 %
Neutro Abs: 11.1 10*3/uL — ABNORMAL HIGH (ref 1.7–7.7)
Neutrophils Relative %: 91 %
Platelets: 145 10*3/uL — ABNORMAL LOW (ref 150–400)
RBC: 3.71 MIL/uL — ABNORMAL LOW (ref 3.87–5.11)
RDW: 15 % (ref 11.5–15.5)
WBC: 12.1 10*3/uL — ABNORMAL HIGH (ref 4.0–10.5)
nRBC: 0 % (ref 0.0–0.2)

## 2019-04-13 LAB — BASIC METABOLIC PANEL
Anion gap: 12 (ref 5–15)
BUN: 41 mg/dL — ABNORMAL HIGH (ref 8–23)
CO2: 27 mmol/L (ref 22–32)
Calcium: 8.5 mg/dL — ABNORMAL LOW (ref 8.9–10.3)
Chloride: 102 mmol/L (ref 98–111)
Creatinine, Ser: 1.25 mg/dL — ABNORMAL HIGH (ref 0.44–1.00)
GFR calc Af Amer: 48 mL/min — ABNORMAL LOW (ref >60)
GFR calc non Af Amer: 42 mL/min — ABNORMAL LOW (ref >60)
Glucose, Bld: 142 mg/dL — ABNORMAL HIGH (ref 70–99)
Potassium: 3.7 mmol/L (ref 3.5–5.1)
Sodium: 141 mmol/L (ref 135–145)

## 2019-04-13 LAB — GLUCOSE, CAPILLARY
Glucose-Capillary: 134 mg/dL — ABNORMAL HIGH (ref 70–99)
Glucose-Capillary: 114 mg/dL — ABNORMAL HIGH (ref 70–99)
Glucose-Capillary: 140 mg/dL — ABNORMAL HIGH (ref 70–99)
Glucose-Capillary: 174 mg/dL — ABNORMAL HIGH (ref 70–99)

## 2019-04-13 LAB — PROTIME-INR
INR: 2.4 — ABNORMAL HIGH (ref 0.8–1.2)
Prothrombin Time: 26.2 seconds — ABNORMAL HIGH (ref 11.4–15.2)

## 2019-04-13 MED ORDER — WARFARIN SODIUM 2 MG PO TABS
2.0000 mg | ORAL_TABLET | Freq: Once | ORAL | Status: AC
  Administered 2019-04-13: 2 mg via ORAL
  Filled 2019-04-13: qty 1

## 2019-04-13 MED ORDER — POTASSIUM CHLORIDE CRYS ER 20 MEQ PO TBCR
20.0000 meq | EXTENDED_RELEASE_TABLET | Freq: Two times a day (BID) | ORAL | Status: DC
  Administered 2019-04-13 – 2019-04-15 (×4): 20 meq via ORAL
  Filled 2019-04-13 (×4): qty 1

## 2019-04-13 MED ORDER — POTASSIUM CHLORIDE CRYS ER 20 MEQ PO TBCR
40.0000 meq | EXTENDED_RELEASE_TABLET | Freq: Once | ORAL | Status: AC
  Administered 2019-04-13: 40 meq via ORAL
  Filled 2019-04-13: qty 2

## 2019-04-13 NOTE — Progress Notes (Signed)
Natasha Lawson PHYSICAL MEDICINE & REHABILITATION PROGRESS NOTE   Subjective/Complaints:  No complaints except being tired.  Per OT, having drainage from cervical spine incision-   ROS: +now has numbness involving knees Denies CP, shortness of breath, nausea, vomiting, diarrhea.   Objective:   No results found. Recent Labs    04/11/19 0754  WBC 8.3  HGB 11.0*  HCT 33.8*  PLT 145*   Recent Labs    04/11/19 0754  NA 142  K 3.3*  CL 103  CO2 30  GLUCOSE 173*  BUN 36*  CREATININE 0.99  CALCIUM 8.2*    Intake/Output Summary (Last 24 hours) at 04/13/2019 0850 Last data filed at 04/12/2019 1836 Gross per 24 hour  Intake 480 ml  Output --  Net 480 ml     Physical Exam: Vital Signs Blood pressure (!) 149/83, pulse (!) 58, temperature 98.3 F (36.8 C), temperature source Oral, resp. rate 18, height 5\' 7"  (1.702 m), weight 111.1 kg, SpO2 98 %. Constitutional: No distress . Vital signs reviewed.  Morbidly obese. Lying in bed, stretching, NAD HENT: Normocephalic.  Atraumatic. Eyes: EOMI. No discharge. Cardiovascular: No JVD. Respiratory: Normal effort.  No stridor. GI: Non-distended. Skin: Incision with some significant drainage- amber colored- 2-3cc after 2-4 minutes was dry prior.  Psych: Normal mood.  Normal behavior. Musc: Lower extremity edema. Neuro: Alert Motor: Grossly 4-4+/5 throughout Sensation diminished to light touch in bilateral hands  Assessment/Plan: 1. 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  Physiatrist is providing close team supervision and 24 hour management of active medical problems listed below.  Physiatrist and rehab team continue to assess barriers to discharge/monitor patient progress toward functional and medical goals  Care Tool:  Bathing    Body parts bathed  by patient: Right arm, Left arm, Abdomen, Chest, Right upper leg, Left upper leg, Face, Front perineal area, Buttocks, Right lower leg, Left lower leg   Body parts bathed by helper: Front perineal area, Buttocks, Right lower leg, Left lower leg     Bathing assist Assist Level: Contact Guard/Touching assist     Upper Body Dressing/Undressing Upper body dressing   What is the patient wearing?: Pull over shirt    Upper body assist Assist Level: Independent with assistive device Assistive Device Comment: dressing stick  Lower Body Dressing/Undressing Lower body dressing      What is the patient wearing?: Underwear/pull up, Pants     Lower body assist Assist for lower body dressing: Minimal Assistance - Patient > 75%     Toileting Toileting    Toileting assist Assist for toileting: Moderate Assistance - Patient 50 - 74%     Transfers Chair/bed transfer  Transfers assist     Chair/bed transfer assist level: Minimal Assistance - Patient > 75%     Locomotion Ambulation   Ambulation assist      Assist level: Minimal Assistance - Patient > 75% Assistive device: Walker-rolling Max distance: 30 ft   Walk 10 feet activity   Assist     Assist level: Minimal Assistance - Patient > 75% Assistive device: Walker-rolling   Walk 50 feet activity   Assist    Assist level: Contact Guard/Touching assist Assistive device: Walker-rolling    Walk 150 feet activity   Assist Walk 150 feet activity did not occur: Safety/medical concerns         Walk 10 feet  on uneven surface  activity   Assist Walk 10 feet on uneven surfaces activity did not occur: Safety/medical concerns         Wheelchair     Assist Will patient use wheelchair at discharge?: No Type of Wheelchair: Manual    Wheelchair assist level: Supervision/Verbal cueing Max wheelchair distance: 100'    Wheelchair 50 feet with 2 turns activity    Assist        Assist Level:  Supervision/Verbal cueing   Wheelchair 150 feet activity     Assist      Assist Level: Supervision/Verbal cueing   Blood pressure (!) 149/83, pulse (!) 58, temperature 98.3 F (36.8 C), temperature source Oral, resp. rate 18, height 5\' 7"  (1.702 m), weight 111.1 kg, SpO2 98 %.  Medical Problem List and Plan: 1.  Impaired mobility and ADLs secondary to cervical myelopathy            Continue CIR 2.  Antithrombotics: -DVT/anticoagulation:  Pharmaceutical: Cont Heparin               Restarted Coumadin home dose of 3mg  Mondays and Fridays on 12/25 with a goal of 2-3.   INR remained subtherapeutic on 12/27, labs pending for today  12/29- INR 2.0  12/30- will call [facility] about concern for drainage since has been 15 days and because having increased numbness up to knees- waiting for response  -antiplatelet therapy: ASA 3. Pain Management:  Oxycodone prn  Flexeril changed to Skelaxin 800 mg TID prn for muscle spasms/tightness due to lethargy  Added Lyrica 25mg  for burning pain in right hand and right leg on 12/27 with improvement  12/29- pt refusing Lyrica- will stop for now. 4. Mood: LCSW to follow for evaluation and support.              -antipsychotic agents: NA 5. Neuropsych: This patient is capable of making decisions on her own behalf. 6. Skin/Wound Care: routine pressure relief measures.  7. Fluids/Electrolytes/Nutrition: Monitor I/O. 8.  Subdural/intradural collection with cord compression: IV decadron 10 mg every 4 hours changed to p.o. every 6 hours on 12/22, decreased to 3 times daily on 12/28 9. Hyperglycemia: has h/o diet controlled DM in the past. Will monitor BS ac/hs as now on steroids. CBG (last 3)  Recent Labs    04/12/19 1626 04/12/19 2100 04/13/19 0605  GLUCAP 124* 230* 134*      12/30- labile BGs- only 1 elevated 10.  OSA: continue CPAP at nights.  12/22- pt refusing- said wants 2L O2 at night.   11. PAF: On Bystolic bid.  Monitor HR TID--continue to hold  coumadin.  12. H/o iron deficiency anemia:  Hemoglobin 11.0 on 12/27, continue to monitor 13. H/o Anxiety disorder: Has been stable on Zoloft.  14. Hypokalemia: Kdur increased to 40 meq bid, DC'd on 12/25  Potassium 3.9 on 12/25, continue to monitor   12/29- K+ was 3.3 on 12/28- -was repleted 12/28 15. Thrombocytopenia:   Platelets 148 on 12/27, continue to monitor 16. HTN/Peripheral edema: On Demadex daily   Labile on 12/28 17. Atrial fibrillation: Coumadin restarted 12/25 (see #2)      LOS: 9 days A FACE TO FACE EVALUATION WAS PERFORMED  Natasha Lawson 04/13/2019, 8:50 AM

## 2019-04-13 NOTE — Progress Notes (Signed)
Physical Therapy Session Note  Patient Details  Name: Natasha Lawson MRN: [id number] Date of Birth: [birth year]  Today's Date: 04/13/2019 PT Individual Time: 1333-1445 PT Individual Time Calculation (min): 72 min   Short Term Goals: Week 2:  PT Short Term Goal 1 (Week 2): STG = LTG due to estimated d/c date.  Skilled Therapeutic Interventions/Progress Updates:  Pt received in bed & agreeable to tx. Pt transfers supine>sitting EOB with supervision, bed rails, and extra time. Pt transfers sit<>stand with mod assist with pt using 3 rocks to assist with transfer. Pt transfers bed>w/c with RW & min assist then propels w/c room>gym with BUE & supervision. Pt transfers sit<>stand with min/mod assist with use of armrests & ambulates 10 ft + 10 ft with RW & min assist with seated rest break in between 2/2 fatigue. Pt demonstrates ataxia BLE (R>L) during gait. Pt stands at high/low table with 1UE support & CGA while assembling a puzzle with task focusing on BUE coordination, standing balance & standing tolerance; pt was able to stand 5 minutes + 7 minutes + 6 minutes with seated rest breaks in between. Pt transferred w/c<>nu-step with RW & min assist. Pt utilized nu-step on level 2 x 6 minutes with BLE only with task focusing on BLE strengthening. Pt returned to room & transferred w/c>BSC with RW & min assist. Pt left sitting on BSC with call bell in reach.  Pt with neck drainage & RN made aware of need for new dressing to incisional site.   Therapy Documentation Precautions:  Precautions Precautions: Cervical Required Braces or Orthoses: Cervical Brace Cervical Brace: For comfort, Soft collar Restrictions Weight Bearing Restrictions: No  Pain: Pt reports unrated muscle soreness in neck - pt declines requesting pain medication.   Therapy/Group: Individual Therapy  Waunita Schooner 04/13/2019, 2:46 PM

## 2019-04-13 NOTE — Progress Notes (Signed)
  Patient ID: Natasha Lawson, female   DOB: [birth year], 78 y.o.   MRN: [id number]  Diagnosis codes:  G95.9;  G82.50  Height:  5'7"  Weight:  248 lbs    Patient suffers from cervical myelopathy, s/p ACDF and quadriplegia which impairs their ability to perform daily activities like bathing, dressing and toileting in the home.  A walker or cane will not resolve the issues with performing these ADLs.  A high strength lightweight wheelchair will allow patient to safely perform daily activities.  This wheelchair will be used primarily in the home setting.  The patient is not able to propel themselves in the home using a standard or lightweight wheelchair due to arm weakness and endurance.  Patient can self propel in the high strength lightweight wheelchair.  I have personally performed a face to face evaluation of this patient and recommend this wheelchair.   Reesa Chew, PA-C

## 2019-04-13 NOTE — Progress Notes (Signed)
Physical Therapy Session Note  Patient Details  Name: Natasha Lawson MRN: [id number] Date of Birth: [birth year]  Today's Date: 04/13/2019 PT Individual Time: 1115-1200 PT Individual Time Calculation (min): 45 min   Short Term Goals: Week 2:  PT Short Term Goal 1 (Week 2): STG = LTG due to estimated d/c date.  Skilled Therapeutic Interventions/Progress Updates:    Pt received seated on BSC with NT present assisting. Pt is max A to stand to RW with setup A for pericare, max A to pull up underpants with RW support for balance. Pt is setup A to change out of her shirt into nightgown. Stand pivot transfer BSC to w/c with RW and max A to stand, mod A for transfer. Manual w/c propulsion 2 x 150 ft with use of BUE at Supervision level. Sit to stand with max A from w/c. Stand pivot transfer to mat table with RW and mod A. Session focus on sit to stand transfer as pt continues to exhibit difficulty with this. Sit to stand 4 x 5 reps from progressively lower mat table (23.5" to 20.5") to RW initially with min A progressing to needing mod A for 20.5" table. Also focus on anterior weight shift needed for transfer and safe hand placement. Stand pivot transfer back to w/c with mod A. Pt requests to return to bed at end of session due to fatigue. Sit to stand with max A from low w/c seat height and due to pt fatigue. Sit to supine mod A for BLE management. Discussed pt's d/c plan and that she would be safer to d/c home with daughter and granddaughter so that she would not have to navigate stairs and would have 24/7 support. Pt in agreement that this is best d/c plan for her currently. Pt left supine in bed with needs in reach at end of session.  Therapy Documentation Precautions:  Precautions Precautions: Cervical Required Braces or Orthoses: Cervical Brace Cervical Brace: For comfort, Soft collar Restrictions Weight Bearing Restrictions: No    Therapy/Group: Individual Therapy   Excell Seltzer, PT,  DPT  04/13/2019, 3:11 PM

## 2019-04-13 NOTE — Progress Notes (Signed)
ANTICOAGULATION CONSULT NOTE - Initial Consult  Pharmacy Consult for warfarin Indication: atrial fibrillation (CHADS2VASc = 4)  Patient Measurements: Height: 5\' 7"  (170.2 cm) Weight: 244 lb 14.9 oz (111.1 kg) IBW/kg (Calculated) : 61.6  Vital Signs: Temp: 98.3 F (36.8 C) (12/30 0521) Temp Source: Oral (12/30 0521) BP: 149/83 (12/30 0521) Pulse Rate: 58 (12/30 0521)  Labs: Recent Labs    04/11/19 0754 04/12/19 0538 04/13/19 0527 04/13/19 0943  HGB 11.0*  --   --  12.3  HCT 33.8*  --   --  38.3  PLT 145*  --   --  145*  LABPROT 20.8* 23.0* 26.2*  --   INR 1.8* 2.0* 2.4*  --   CREATININE 0.99  --   --  1.25*    Estimated Creatinine Clearance: 49.2 mL/min (A) (by C-G formula based on SCr of 1.25 mg/dL (H)).  Assessment: 41 yoF with AFib on warfarin PTA admitted with cervical laminectomy. Per neurology, pharmacy can resume warfarin on POD#10 (12/25). Nursing spoke with patient and she states PTA she was taking Warfarin 2 mg PO daily.   INR up to 2.4 at goal. H/H & plt stable. Will continue home dose of 2mg  one more day to see if INR trend slows.  If continues to be stable, consider scheduling home dose and decrease INR frequency.  Goal of Therapy:  INR 2-3 Monitor platelets by anticoagulation protocol: Yes   Plan:  -Warfarin 2 mg PO x 1 dose tonight  -Daily Pt-INR until stable, then decrease frequency  -Monitor patient for s/s of bleeding  Benetta Spar, PharmD, BCPS, BCCP Clinical Pharmacist  Please check AMION for all [facility] phone numbers After 10:00 PM, call [place] [phone]

## 2019-04-13 NOTE — Plan of Care (Addendum)
Downgraded transfer and mobility goals from Supervision to CGA to min A overall due to slow progress. D/c stair goal due to decreased safety and slow progress. Added w/c mobility goals due to anticipate pt will d/c home with w/c as primary means of mobility.

## 2019-04-13 NOTE — Plan of Care (Signed)
  Problem: Consults Goal: RH SPINAL CORD INJURY PATIENT EDUCATION Description:  See Patient Education module for education specifics.  Outcome: Progressing Goal: Skin Care Protocol Initiated - if Braden Score 18 or less Description: If consults are not indicated, leave blank or document N/A Outcome: Progressing   Problem: SCI BOWEL ELIMINATION Goal: RH STG MANAGE BOWEL WITH ASSISTANCE Description: STG Manage Bowel with min Assistance. Outcome: Progressing   Problem: SCI BLADDER ELIMINATION Goal: RH STG MANAGE BLADDER WITH ASSISTANCE Description: STG Manage Bladder With min Assistance Outcome: Progressing   Problem: RH SAFETY Goal: RH STG ADHERE TO SAFETY PRECAUTIONS W/ASSISTANCE/DEVICE Description: STG Adhere to Safety Precautions With mod I Assistance/Device. Outcome: Progressing Goal: RH STG DECREASED RISK OF FALL WITH ASSISTANCE Description: STG Decreased Risk of Fall With mod I Assistance. Outcome: Progressing   Problem: RH SKIN INTEGRITY Goal: RH STG SKIN FREE OF INFECTION/BREAKDOWN Description: Patients skin will remain free from further infection or breakdown with mod I assist. Outcome: Progressing Goal: RH STG MAINTAIN SKIN INTEGRITY WITH ASSISTANCE Description: STG Maintain Skin Integrity With mod I Assistance. Outcome: Progressing   Problem: RH PAIN MANAGEMENT Goal: RH STG PAIN MANAGED AT OR BELOW PT'S PAIN GOAL Description: < 4 Outcome: Progressing

## 2019-04-13 NOTE — Progress Notes (Signed)
Pt's wound examined, appears c/w mobilizing seroma. Given the drainage, I do agree w/ doing one week of cephalexin, pt can f/u with me 1 week after discharge from rehab. Call with any concerns or questions

## 2019-04-13 NOTE — Progress Notes (Signed)
Occupational Therapy Session Note  Patient Details  Name: Natasha Lawson MRN: [id number] Date of Birth: 01-May-1940  Today's Date: 04/13/2019 OT Individual Time: [id number] OT Individual Time Calculation (min): 75 min    Short Term Goals: Week 2:  OT Short Term Goal 1 (Week 2): STG=LTG due to LOS  Skilled Therapeutic Interventions/Progress Updates:    Pt seen for OT ADL bathing/dressing session. Pt sitting up on BSC upon arrival, agreeable to tx session  Transfers/mobility: Sit>stand from heavy duty BSC with RW and guarding assist. Stand pivot transfer with RW into shower with min A and VCs for RW management in functional context, exited shower in same manner as described above.  ADL Re-training: Toileting completed with steadying assist and use of toilet aid in order to complete pericare hygiene.  Bathed seated on TTB in shower with use of LH sponge for LB bathing and pericare/buttock hygiene when standing with grab bar and close supervision. Recommend complete buttock hygiene outside of shower at d/c in order to decrease risk of fall in shower, pt voiced understanding.  Returned to w/c to dress, UB dressing completed mod I. LB dressing completed with use of dressing stick and steadying assist while pt pulled up pants. Use visual feedback of mirror to assist with compensation of lack of sensation in B hands when pulling pants up, able to complete with increased time and assist for balance, alternating UE support on RW. TED hose donned total A and use of shoe horn for donning slide on shoes. Grooming tasks completed from w/c level at sink mod I  Pt let seated in w/c at end of session, all needs in reach.   MD made aware of increased drainage from surgical incision. Surgical site covered with waterproof dressing prior to shower  Therapy Documentation Precautions:  Precautions Precautions: Cervical Required Braces or Orthoses: Cervical Brace Cervical Brace: For comfort, Soft  collar Restrictions Weight Bearing Restrictions: No Pain: Pain Assessment Pain Scale: 0-10 Pain Score: 0-No pain   Therapy/Group: Individual Therapy  Syrenity Klepacki L 04/13/2019, 7:04 AM

## 2019-04-13 NOTE — Progress Notes (Signed)
Social Work Patient ID: Natasha Lawson, female   DOB: [birth year], 78 y.o.   MRN: [id number]   Have reviewed team conf with pt who is aware and agreeable with targeted d/c for 1/7.  She is  in discussion with family about which d/c location as husband is to have knee surgery next week.  Cont to follow.  Gearlene Godsil, LCSW

## 2019-04-14 ENCOUNTER — Inpatient Hospital Stay (HOSPITAL_COMMUNITY): Payer: PRIVATE HEALTH INSURANCE | Admitting: Physical Therapy

## 2019-04-14 ENCOUNTER — Inpatient Hospital Stay (HOSPITAL_COMMUNITY): Payer: PRIVATE HEALTH INSURANCE

## 2019-04-14 LAB — PROTIME-INR
INR: 2.8 — ABNORMAL HIGH (ref 0.8–1.2)
Prothrombin Time: 29.6 seconds — ABNORMAL HIGH (ref 11.4–15.2)

## 2019-04-14 LAB — GLUCOSE, CAPILLARY
Glucose-Capillary: 158 mg/dL — ABNORMAL HIGH (ref 70–99)
Glucose-Capillary: 165 mg/dL — ABNORMAL HIGH (ref 70–99)
Glucose-Capillary: 143 mg/dL — ABNORMAL HIGH (ref 70–99)
Glucose-Capillary: 206 mg/dL — ABNORMAL HIGH (ref 70–99)

## 2019-04-14 MED ORDER — WARFARIN SODIUM 1 MG PO TABS
1.0000 mg | ORAL_TABLET | Freq: Once | ORAL | Status: AC
  Administered 2019-04-14: 1 mg via ORAL
  Filled 2019-04-14: qty 1

## 2019-04-14 MED ORDER — CEPHALEXIN 250 MG PO CAPS
500.0000 mg | ORAL_CAPSULE | Freq: Two times a day (BID) | ORAL | Status: AC
  Administered 2019-04-14 – 2019-04-20 (×14): 500 mg via ORAL
  Filled 2019-04-14 (×14): qty 2

## 2019-04-14 NOTE — Progress Notes (Signed)
Physical Therapy Session Note  Patient Details  Name: Natasha Lawson MRN: [id number] Date of Birth: 03-02-1941  Today's Date: 04/14/2019 PT Individual Time: 0900-1000 PT Individual Time Calculation (min): 60 min   Short Term Goals: Week 2:  PT Short Term Goal 1 (Week 2): STG = LTG due to estimated d/c date.  Skilled Therapeutic Interventions/Progress Updates:    Pt received seated on BSC in room, agreeable to PT session. No complaints of pain this AM. Sit to stand with min A from [facility] to RW, dependent for pericare and max A for donning underwear and pulling up pants over hips. Pt is dependent for donning TED hose and shoes for time conservation. Stand pivot transfer BSC to w/c with min A and RW. Pt is mod A to stand from w/c to RW, reviewed anterior weight shift to aid with transfer as well as safe hand placement. Ambulation x 53 ft with RW and min A with close w/c follow for safety. Pt continues to exhibit poor proprioception in BLE resulting in ataxic gait pattern. Discussed pt's d/c plan and she reports she does plan on d/c to her own home where she has several short (4") steps to enter with R handrail. Per pt her family is arranging support to be with her as therapy is recommending 24/7 assist upon d/c home and depending on her husband's current level of function he may not be able to provide this level of assist. Ascend/descend 2 x 3" steps with R handrail and mod A for balance. Reviewed management of w/c parts and pt is able to perform return demo of donning/doffing B w/c leg rests. Manual w/c propulsion 2 x 150 ft with use of BUE at Supervision level. Pt left seated in w/c in room with needs in reach at end of session. Cotreatment session with RT.  Therapy Documentation Precautions:  Precautions Precautions: Cervical Required Braces or Orthoses: Cervical Brace Cervical Brace: For comfort, Soft collar Restrictions Weight Bearing Restrictions: No    Therapy/Group: Individual  Therapy   Excell Seltzer, PT, DPT  04/14/2019, 12:42 PM

## 2019-04-14 NOTE — Progress Notes (Signed)
ANTICOAGULATION CONSULT NOTE - Follow-Up Consult  Pharmacy Consult for warfarin Indication: atrial fibrillation (CHADS2VASc = 4)  Patient Measurements: Height: 5\' 7"  (170.2 cm) Weight: 244 lb 14.9 oz (111.1 kg) IBW/kg (Calculated) : 61.6  Vital Signs: Temp: 97.7 F (36.5 C) (12/31 0324) BP: 167/68 (12/31 0324) Pulse Rate: 49 (12/31 0324)  Labs: Recent Labs    04/12/19 0538 04/13/19 0527 04/13/19 0943 04/14/19 0551  HGB  --   --  12.3  --   HCT  --   --  38.3  --   PLT  --   --  145*  --   LABPROT 23.0* 26.2*  --  29.6*  INR 2.0* 2.4*  --  2.8*  CREATININE  --   --  1.25*  --     Estimated Creatinine Clearance: 49.2 mL/min (A) (by C-G formula based on SCr of 1.25 mg/dL (H)).  Assessment: 78 yo F with AFib on warfarin PTA admitted with cervical laminectomy. Per neurology, pharmacy can resume warfarin on POD#10 (12/25). Nursing spoke with patient and she states PTA she was taking Warfarin 2 mg PO daily. Med hx states 2mg  daily except 3mg  on Mon/Fri.  INR up to 2.8, at goal but rising quickly. H/H & plt stable. Will reduce dose tonight.    Goal of Therapy:  INR 2-3 Monitor platelets by anticoagulation protocol: Yes   Plan:  -Warfarin 1 mg PO x 1 dose tonight  -Daily PT-INR until stable, then decrease frequency  -Monitor patient for s/s of bleeding  [employer], Pharm.D., BCPS Clinical Pharmacist Clinical phone for 04/14/2019 from 8:30-4:00 is [phone].  **Pharmacist phone directory can be found on [email or web address] listed under [place].  04/14/2019 9:41 AM

## 2019-04-14 NOTE — Progress Notes (Signed)
Occupational Therapy Session Note  Patient Details  Name: Natasha Lawson MRN: [id number] Date of Birth: 18-Mar-1941  Today's Date: 04/14/2019 OT Individual Time: 1300-1400 OT Individual Time Calculation (min): 60 min    Short Term Goals: Week 2:  OT Short Term Goal 1 (Week 2): STG=LTG due to LOS  Skilled Therapeutic Interventions/Progress Updates:    Pt received sitting up in the w/c with c/o soreness in neck but no real pain. Pt agreeable to shower. Neck incision already occluded with waterproof dressing. Pt completed sit > stand from w/c with mod A. Pt completed functional mobility with min A into the bathroom with RW. Pt transferred onto the TTB with min A. Pt doffed LB clothing with use of dressing stick. Pt completed UB bathing seated with use of LH sponge. Min A to stand and complete LB bathing with LH sponge. Pt transferred out of shower and back to w/c with mod A. Pt donned underwear and pants with min A sit <> stand with use of the dressing stick. Pt donned shirt with set up assist. Teds donned with total A. Pt left sitting up in the w/c with all needs met.   Therapy Documentation Precautions:  Precautions Precautions: Cervical Required Braces or Orthoses: Cervical Brace Cervical Brace: For comfort, Soft collar Restrictions Weight Bearing Restrictions: No   Therapy/Group: Individual Therapy  Curtis Sites 04/14/2019, 6:58 AM

## 2019-04-14 NOTE — Progress Notes (Signed)
Physical Therapy Session Note  Patient Details  Name: Natasha Lawson MRN: [id number] Date of Birth: Mar 11, 1941  Today's Date: 04/14/2019 PT Individual Time: 1522-1610 PT Individual Time Calculation (min): 48 min   Short Term Goals:  Week 2:  PT Short Term Goal 1 (Week 2): STG = LTG due to estimated d/c date.  Skilled Therapeutic Interventions/Progress Updates:   Pt resting in bed.  She denied pain.  Supine> sit using bed features, supervision.  PT donned soft collar.  Sit> stand from raised bed, mod assist.  Stand ivot with mod assist with RW to w/c.   +2 with Lattie Haw, Rec therapist.   transfer training, blocked practice: 1) scoot forward 2) check feet and place under knees  3) lean far forward before pushing up to stand.  Use of raised seat in wc and bench in front of pt with hands placed on it to encourage forward wt shift/ head-hips relationship, with improved wt shift with practice  Standing in parallel bars, for neuromuscular re-education via mirror feedback, multimodal cues and demo, for min squats, heel raises (over-reliance on UEs due to bil LE weakness).  Kicking cups with R/L foot x 6 each, bil UE support on parallel bars, with cues for hamstring curls to prepare to kick; good carry over.  Gait training in parallel bars, 4" wide board between feet to widen BOS x 8' forward/backward, iwht min/mod assist.  No knee buckling.  Max cues needed for safe step lengths when moving backwards.   At end of session, pt in w/c with needs at hand.  PT discussed falls precautions; pt verbalized understanding.       Therapy Documentation Precautions:  Precautions Precautions: Cervical Required Braces or Orthoses: Cervical Brace Cervical Brace: For comfort, Soft collar Restrictions Weight Bearing Restrictions: No          Therapy/Group: Individual Therapy  Fallon Howerter 04/14/2019, 4:18 PM

## 2019-04-14 NOTE — Progress Notes (Signed)
[facility] PHYSICAL MEDICINE & REHABILITATION PROGRESS NOTE   Subjective/Complaints:  Neck is sore- wants some tylenol- bladder "was bad" yesterday- voided on floor form urge incontinence yesterday "a couple of times".   Better today as well as numbness. They did wound Cx this AM= dressing soaked again as well and was changed ~ 3-4 am per pt.  NSU suggested Keflex x 1 week and f/u 1 week after d/c.   ROS: +numbness improved today Denies CP, shortness of breath, nausea, vomiting, diarrhea.   Objective:   No results found. Recent Labs    04/13/19 0943  WBC 12.1*  HGB 12.3  HCT 38.3  PLT 145*   Recent Labs    04/13/19 0943  NA 141  K 3.7  CL 102  CO2 27  GLUCOSE 142*  BUN 41*  CREATININE 1.25*  CALCIUM 8.5*    Intake/Output Summary (Last 24 hours) at 04/14/2019 1016 Last data filed at 04/14/2019 0700 Gross per 24 hour  Intake 360 ml  Output --  Net 360 ml     Physical Exam: Vital Signs Blood pressure (!) 167/68, pulse (!) 49, temperature 97.7 F (36.5 C), resp. rate 19, height 5\' 7"  (1.702 m), weight 111.1 kg, SpO2 100 %. Constitutional: No distress . Vital signs reviewed.  Morbidly obese. Lying in bed, nursing in room passing meds; bright affect, NAD HENT: Normocephalic.  Atraumatic. Eyes: EOMI. No discharge. Cardiovascular: No JVD. Respiratory: Normal effort.  No stridor. GI: Non-distended. Skin: Incision with some significant drainage- light to medium amber colored- was soaking dressing since 3-4 am- but no drainage seen on skin when dressing removed Psych: bright affect Musc: Lower extremity edema. Neuro: Alert Motor: Grossly 4-4+/5 throughout Sensation diminished to light touch in bilateral hands, ankles/feet  Assessment/Plan: 1. 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  Physiatrist is providing close team supervision and 24 hour management of active medical problems listed below.  Physiatrist and rehab team continue to assess barriers to discharge/monitor patient progress toward functional and medical goals  Care Tool:  Bathing    Body parts bathed by patient: Right arm, Left arm, Abdomen, Chest, Right upper leg, Left upper leg, Face, Front perineal area, Buttocks, Right lower leg, Left lower leg   Body parts bathed by helper: Front perineal area, Buttocks, Right lower leg, Left lower leg     Bathing assist Assist Level: Supervision/Verbal cueing(LH sponge)     Upper Body Dressing/Undressing Upper body dressing   What is the patient wearing?: Pull over shirt    Upper body assist Assist Level: Independent Assistive Device Comment: dressing stick  Lower Body Dressing/Undressing Lower body dressing      What is the patient wearing?: Underwear/pull up, Pants     Lower body assist Assist for lower body dressing: Contact Guard/Touching assist(Dressing stick)     Toileting Toileting    Toileting assist Assist for toileting: Contact Guard/Touching assist     Transfers Chair/bed transfer  Transfers assist     Chair/bed transfer assist level: Maximal Assistance - Patient 25 - 49%     Locomotion Ambulation   Ambulation assist      Assist level: Minimal Assistance - Patient > 75% Assistive device: Walker-rolling Max distance: 10 ft   Walk 10 feet activity   Assist     Assist level: Minimal Assistance - Patient > 75% Assistive device: Walker-rolling  Walk 50 feet activity   Assist    Assist level: Contact Guard/Touching assist Assistive device: Walker-rolling    Walk 150 feet activity   Assist Walk 150 feet activity did not occur: Safety/medical concerns         Walk 10 feet on uneven surface  activity   Assist Walk 10 feet on uneven surfaces activity did not occur: Safety/medical  concerns         Wheelchair     Assist Will patient use wheelchair at discharge?: Yes Type of Wheelchair: Manual    Wheelchair assist level: Supervision/Verbal cueing Max wheelchair distance: 150 ft    Wheelchair 50 feet with 2 turns activity    Assist        Assist Level: Supervision/Verbal cueing   Wheelchair 150 feet activity     Assist      Assist Level: Supervision/Verbal cueing   Blood pressure (!) 167/68, pulse (!) 49, temperature 97.7 F (36.5 C), resp. rate 19, height 5\' 7"  (1.702 m), weight 111.1 kg, SpO2 100 %.  Medical Problem List and Plan: 1.  Impaired mobility and ADLs secondary to cervical myelopathy            Continue CIR 2.  Antithrombotics: -DVT/anticoagulation:  Pharmaceutical: Cont Heparin               Restarted Coumadin home dose of 3mg  Mondays and Fridays on 12/25 with a goal of 2-3.   INR remained subtherapeutic on 12/27, labs pending for today  12/29- INR 2.0  12/30- will call NSU about concern for drainage since has been 15 days and because having increased numbness up to knees- waiting for response  12/31- INR 2.8- pharmacy is decreasing dose- might need decreased more due to Keflex  -antiplatelet therapy: ASA 3. Pain Management:  Oxycodone prn  Flexeril changed to Skelaxin 800 mg TID prn for muscle spasms/tightness due to lethargy  Added Lyrica 25mg  for burning pain in right hand and right leg on 12/27 with improvement  12/29- pt refusing Lyrica- will stop for now. 4. Mood: LCSW to follow for evaluation and support.              -antipsychotic agents: NA 5. Neuropsych: This patient is capable of making decisions on her own behalf. 6. Skin/Wound Care: routine pressure relief measures.  7. Fluids/Electrolytes/Nutrition: Monitor I/O. 8.  Subdural/intradural collection with cord compression: IV decadron 10 mg every 4 hours changed to p.o. every 6 hours on 12/22, decreased to 3 times daily on 12/28 9. Hyperglycemia: has h/o  diet controlled DM in the past. Will monitor BS ac/hs as now on steroids. CBG (last 3)  Recent Labs    04/13/19 1636 04/13/19 2128 04/14/19 0640  GLUCAP 140* 174* 158*      12/31- better controlled 10.  OSA: continue CPAP at nights.  12/22- pt refusing- said wants 2L O2 at night.   11. PAF: On Bystolic bid.  Monitor HR TID--continue to hold coumadin.  12. H/o iron deficiency anemia:  Hemoglobin 11.0 on 12/27, continue to monitor 13. H/o Anxiety disorder: Has been stable on Zoloft.  14. Hypokalemia: Kdur increased to 40 meq bid, DC'd on 12/25  Potassium 3.9 on 12/25, continue to monitor   12/29- K+ was 3.3 on 12/28- -was repleted 12/28  12/30- K+ 3.7 15. Thrombocytopenia:   Platelets 148 on 12/27, continue to monitor 16. HTN/Peripheral edema: On Demadex daily   Labile on 12/28 17. Atrial fibrillation: Coumadin restarted 12/25 (see #2) 18. Seroma?-  per NSU, will start Keflex- due to Renal impairment, will do 500 mg BID x 7 days and needs f/u with Osterguard in 7 days. Will check labs in AM to f/u on WBC and renal fxn     LOS: 10 days A FACE TO FACE EVALUATION WAS PERFORMED  Nayah Lukens 04/14/2019, 10:16 AM

## 2019-04-14 NOTE — Evaluation (Signed)
Recreational Therapy Assessment and Plan  Patient Details  Name: Natasha Lawson MRN: [id number] Date of Birth: 03-27-1941 Today's Date: 04/14/2019  Rehab Potential: Good ELOS: discharge 1/6   Assessment  Problem List:      Patient Active Problem List   Diagnosis Date Noted  . HTN (hypertension) 04/05/2019  . Quadriplegia ([place]) 04/05/2019  . Atrial fibrillation ([place]) 04/05/2019  . Anticoagulated on Coumadin 04/05/2019  . 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  . Cervical myelopathy ([place]) 03/29/2019    Past Medical History:      Past Medical History:  Diagnosis Date  . Anxiety   . Cancer [facility])    breast cancer right  . Dysrhythmia    A-Fib on coumadin  . GERD (gastroesophageal reflux disease)   . Gout   . 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 History of blood transfusion   . Hypertension   . Sleep apnea    uses CPAP nightly   Past Surgical History:       Past Surgical History:  Procedure Laterality Date  . ABDOMINAL HYSTERECTOMY    . BREAST SURGERY Right 08/14/2014   lumpectomy/lynph nodes  . BREAST SURGERY Right 10/05/2016   mass removed  . CARDIAC CATHETERIZATION    . CARPAL TUNNEL RELEASE Bilateral 10/01/11, 12/02/11  . CHOLECYSTECTOMY  1987  . COLONOSCOPY    . DILATION AND CURETTAGE OF UTERUS  10/10/2008   hysteroscopy with biopsy  . EYE SURGERY Bilateral    removed cataracts  . HYSTEROSCOPY WITH D & C  08/19/2011  . INCONTINENCE SURGERY  01/11/2007  . JOINT REPLACEMENT Right 10/18/2013  . laparoscopy bilateral salpingo-oophorectomy  01/27/2012  . NECK SURGERY  11/16/2017  . pci lad  01/02/2015   stent   . POSTERIOR CERVICAL FUSION/FORAMINOTOMY N/A 03/29/2019   Procedure: Cervical Five to Cervical Seven Posterior cervical laminectomy and instrumented fusion;  Surgeon: Judith Part, MD;  Location: [facility];  Service: Neurosurgery;  Laterality: N/A;  Cervical Five to Cervical Seven Posterior cervical  laminectomy and instrumented fusion  . radiation breast canceer Right    from 12/08/14-01/12/15  . rotator cuff  Right 04/26/2010   repair  . TONSILLECTOMY  1954  . TUBAL LIGATION  1984  . UPPER GI ENDOSCOPY  2016    Assessment & Plan Clinical Impression: Natasha Lawson is a 78 year old female with history of breast cancer, gut, HTN, OSA, PAF (needed admission), attempts at ACDF (residual left sided weakness and neuropathy symptoms) in the past, gait disorder with falls who 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 She was discharged to home on 12/16 but has had problems managing with recurrent falls and difficulty walking. She was admitted on 12/18 with MRI Cervical spine revealing post op changes with small subdural and/or intradural collection extending form C5-C7 with mass effect on dorsal and right aspect with moderate compression of cervical spinal cord without signal changes--fluid collection extends form C7 to upper thoracic spinal cord. She was started on IV decadron and chronic coumadin to be held thorough POD# 10. Therapy evaluations done revealing functional deficits and CIR recommended due myelopathy.  Patient transferred to CIR on 04/04/2019.    Pt presents with decreased activity tolerance, decreased functional mobility, decreased balance Limiting pt's independence with leisure/community pursuits.  Pt referred by team for coping/stress management training.    Leisure History/Participation Premorbid leisure interest/current participation: Medical laboratory scientific officer - Travel (Comment);Community - Building control surveyor - Shopping  mall;Games - Cross-word Leisure Participation Style: With Family/Friends Awareness of Community Resources: Good-identify 3 post discharge leisure resources Psychosocial / Spiritual Social interaction - Mood/Behavior: Cooperative Academic librarian Appropriate for Education?:  Yes Strengths/Weaknesses Patient Strengths/Abilities: Willingness to participate Patient weaknesses: Physical limitations;Minimal Premorbid Leisure Activity TR Patient demonstrates impairments in the following area(s): Edema;Endurance;Motor;Skin Integrity;Sensory  Plan Rec Therapy Plan Is patient appropriate for Therapeutic Recreation?: Yes Rehab Potential: Good Treatment times per week: Min 1 TR session >20 minutes during LOS Estimated Length of Stay: discharge 1/6 TR Treatment/Interventions: Patient/family education;1:1 session;Functional mobility training;Balance/vestibular training;Recreation/leisure participation;Leisure education;Other (Comment);Wheelchair propulsion/positioning(relaxtion training)  Recommendations for other services: None   Discharge Criteria: Patient will be discharged from TR if patient refuses treatment 3 consecutive times without medical reason.  If treatment goals not met, if there is a change in medical status, if patient makes no progress towards goals or if patient is discharged from hospital.  The above assessment, treatment plan, treatment alternatives and goals were discussed and mutually agreed upon: by patient  [place] 04/14/2019, 12:23 PM

## 2019-04-15 ENCOUNTER — Inpatient Hospital Stay (HOSPITAL_COMMUNITY): Payer: PRIVATE HEALTH INSURANCE | Admitting: *Deleted

## 2019-04-15 ENCOUNTER — Inpatient Hospital Stay (HOSPITAL_COMMUNITY): Payer: PRIVATE HEALTH INSURANCE

## 2019-04-15 LAB — BASIC METABOLIC PANEL
Anion gap: 10 (ref 5–15)
BUN: 42 mg/dL — ABNORMAL HIGH (ref 8–23)
CO2: 27 mmol/L (ref 22–32)
Calcium: 8.5 mg/dL — ABNORMAL LOW (ref 8.9–10.3)
Chloride: 102 mmol/L (ref 98–111)
Creatinine, Ser: 1.04 mg/dL — ABNORMAL HIGH (ref 0.44–1.00)
GFR calc Af Amer: >60 mL/min (ref >60)
GFR calc non Af Amer: 52 mL/min — ABNORMAL LOW (ref >60)
Glucose, Bld: 137 mg/dL — ABNORMAL HIGH (ref 70–99)
Potassium: 4.5 mmol/L (ref 3.5–5.1)
Sodium: 139 mmol/L (ref 135–145)

## 2019-04-15 LAB — CBC WITH DIFFERENTIAL/PLATELET
Abs Immature Granulocytes: 0.4 10*3/uL — ABNORMAL HIGH (ref 0.00–0.07)
Basophils Absolute: 0 10*3/uL (ref 0.0–0.1)
Basophils Relative: 0 %
Eosinophils Absolute: 0 10*3/uL (ref 0.0–0.5)
Eosinophils Relative: 0 %
HCT: 35.9 % — ABNORMAL LOW (ref 36.0–46.0)
Hemoglobin: 11.9 g/dL — ABNORMAL LOW (ref 12.0–15.0)
Immature Granulocytes: 4 %
Lymphocytes Relative: 3 %
Lymphs Abs: 0.3 10*3/uL — ABNORMAL LOW (ref 0.7–4.0)
MCH: 32.9 pg (ref 26.0–34.0)
MCHC: 33.1 g/dL (ref 30.0–36.0)
MCV: 99.2 fL (ref 80.0–100.0)
Monocytes Absolute: 0.6 10*3/uL (ref 0.1–1.0)
Monocytes Relative: 5 %
Neutro Abs: 10.1 10*3/uL — ABNORMAL HIGH (ref 1.7–7.7)
Neutrophils Relative %: 88 %
Platelets: 127 10*3/uL — ABNORMAL LOW (ref 150–400)
RBC: 3.62 MIL/uL — ABNORMAL LOW (ref 3.87–5.11)
RDW: 14.6 % (ref 11.5–15.5)
WBC: 11.4 10*3/uL — ABNORMAL HIGH (ref 4.0–10.5)
nRBC: 0 % (ref 0.0–0.2)

## 2019-04-15 LAB — GLUCOSE, CAPILLARY
Glucose-Capillary: 123 mg/dL — ABNORMAL HIGH (ref 70–99)
Glucose-Capillary: 170 mg/dL — ABNORMAL HIGH (ref 70–99)
Glucose-Capillary: 170 mg/dL — ABNORMAL HIGH (ref 70–99)
Glucose-Capillary: 268 mg/dL — ABNORMAL HIGH (ref 70–99)

## 2019-04-15 LAB — PROTIME-INR
INR: 2.9 — ABNORMAL HIGH (ref 0.8–1.2)
Prothrombin Time: 30.1 seconds — ABNORMAL HIGH (ref 11.4–15.2)

## 2019-04-15 MED ORDER — WARFARIN SODIUM 1 MG PO TABS
1.0000 mg | ORAL_TABLET | Freq: Once | ORAL | Status: AC
  Administered 2019-04-15: 1 mg via ORAL
  Filled 2019-04-15: qty 1

## 2019-04-15 MED ORDER — POTASSIUM CHLORIDE CRYS ER 20 MEQ PO TBCR
20.0000 meq | EXTENDED_RELEASE_TABLET | Freq: Every day | ORAL | Status: DC
  Administered 2019-04-16 – 2019-04-21 (×6): 20 meq via ORAL
  Filled 2019-04-15 (×6): qty 1

## 2019-04-15 NOTE — Plan of Care (Signed)
  Problem: Consults Goal: RH SPINAL CORD INJURY PATIENT EDUCATION Description:  See Patient Education module for education specifics.  04/15/2019 0137 by Sandrea Hammond, LPN Outcome: Progressing 04/15/2019 0124 by Sandrea Hammond, LPN Outcome: Progressing Goal: Skin Care Protocol Initiated - if Braden Score 18 or less Description: If consults are not indicated, leave blank or document N/A 04/15/2019 0137 by Sandrea Hammond, LPN Outcome: Progressing 04/15/2019 0124 by Sandrea Hammond, LPN Outcome: Progressing   Problem: SCI BOWEL ELIMINATION Goal: RH STG MANAGE BOWEL WITH ASSISTANCE Description: STG Manage Bowel with min Assistance. 04/15/2019 0137 by Sandrea Hammond, LPN Outcome: Progressing 04/15/2019 0124 by Sandrea Hammond, LPN Outcome: Progressing   Problem: SCI BLADDER ELIMINATION Goal: RH STG MANAGE BLADDER WITH ASSISTANCE Description: STG Manage Bladder With min Assistance 04/15/2019 0137 by Sandrea Hammond, LPN Outcome: Progressing 04/15/2019 0124 by Sandrea Hammond, LPN Outcome: Progressing   Problem: RH SAFETY Goal: RH STG ADHERE TO SAFETY PRECAUTIONS W/ASSISTANCE/DEVICE Description: STG Adhere to Safety Precautions With mod I Assistance/Device. 04/15/2019 0137 by Sandrea Hammond, LPN Outcome: Progressing 04/15/2019 0124 by Sandrea Hammond, LPN Outcome: Progressing Goal: RH STG DECREASED RISK OF FALL WITH ASSISTANCE Description: STG Decreased Risk of Fall With mod I Assistance. 04/15/2019 0137 by Sandrea Hammond, LPN Outcome: Progressing 04/15/2019 0124 by Sandrea Hammond, LPN Outcome: Progressing   Problem: RH SKIN INTEGRITY Goal: RH STG SKIN FREE OF INFECTION/BREAKDOWN Description: Patients skin will remain free from further infection or breakdown with mod I assist. 04/15/2019 0137 by Sandrea Hammond, LPN Outcome: Progressing 04/15/2019 0124 by Sandrea Hammond, LPN Outcome: Progressing Goal: RH STG MAINTAIN SKIN INTEGRITY WITH ASSISTANCE Description: STG Maintain Skin Integrity With mod I  Assistance. 04/15/2019 0137 by Sandrea Hammond, LPN Outcome: Progressing 04/15/2019 0124 by Sandrea Hammond, LPN Outcome: Progressing   Problem: RH PAIN MANAGEMENT Goal: RH STG PAIN MANAGED AT OR BELOW PT'S PAIN GOAL Description: < 4 04/15/2019 0137 by Sandrea Hammond, LPN Outcome: Progressing 04/15/2019 0124 by Sandrea Hammond, LPN Outcome: Progressing

## 2019-04-15 NOTE — Plan of Care (Signed)
  Problem: Consults Goal: RH SPINAL CORD INJURY PATIENT EDUCATION Description:  See Patient Education module for education specifics.  Outcome: Progressing Goal: Skin Care Protocol Initiated - if Braden Score 18 or less Description: If consults are not indicated, leave blank or document N/A Outcome: Progressing   Problem: SCI BOWEL ELIMINATION Goal: RH STG MANAGE BOWEL WITH ASSISTANCE Description: STG Manage Bowel with min Assistance. Outcome: Progressing   Problem: SCI BLADDER ELIMINATION Goal: RH STG MANAGE BLADDER WITH ASSISTANCE Description: STG Manage Bladder With min Assistance Outcome: Progressing   Problem: RH SAFETY Goal: RH STG ADHERE TO SAFETY PRECAUTIONS W/ASSISTANCE/DEVICE Description: STG Adhere to Safety Precautions With mod I Assistance/Device. Outcome: Progressing Goal: RH STG DECREASED RISK OF FALL WITH ASSISTANCE Description: STG Decreased Risk of Fall With mod I Assistance. Outcome: Progressing   Problem: RH SKIN INTEGRITY Goal: RH STG SKIN FREE OF INFECTION/BREAKDOWN Description: Patients skin will remain free from further infection or breakdown with mod I assist. Outcome: Progressing Goal: RH STG MAINTAIN SKIN INTEGRITY WITH ASSISTANCE Description: STG Maintain Skin Integrity With mod I Assistance. Outcome: Progressing   Problem: RH PAIN MANAGEMENT Goal: RH STG PAIN MANAGED AT OR BELOW PT'S PAIN GOAL Description: < 4 Outcome: Progressing

## 2019-04-15 NOTE — Progress Notes (Signed)
[facility] PHYSICAL MEDICINE & REHABILITATION PROGRESS NOTE   Subjective/Complaints:  Neck is feeling better overall- moving better- legs now have "squiggly" sensation and hands less stiff since last night.  LBM 2 days ago- suggested asking for prns if no BM by tonight.  .   ROS: +numbness improved today Denies CP, shortness of breath, nausea, vomiting, diarrhea.   Objective:   No results found. Recent Labs    04/13/19 0943 04/15/19 0526  WBC 12.1* 11.4*  HGB 12.3 11.9*  HCT 38.3 35.9*  PLT 145* 127*   Recent Labs    04/13/19 0943 04/15/19 0526  NA 141 139  K 3.7 4.5  CL 102 102  CO2 27 27  GLUCOSE 142* 137*  BUN 41* 42*  CREATININE 1.25* 1.04*  CALCIUM 8.5* 8.5*    Intake/Output Summary (Last 24 hours) at 04/15/2019 0939 Last data filed at 04/15/2019 0840 Gross per 24 hour  Intake 720 ml  Output --  Net 720 ml     Physical Exam: Vital Signs Blood pressure (!) 168/82, pulse (!) 49, temperature 97.6 F (36.4 C), resp. rate 19, height 5\' 7"  (1.702 m), weight 111.1 kg, SpO2 100 %. Constitutional: No distress . Vital signs reviewed.  Morbidly obese. On BSC- getting underwear pulled up by pt and OT, NAD HENT: Normocephalic.  Atraumatic. Eyes: EOMI. No discharge. Cardiovascular: No JVD. Respiratory: Normal effort.  No stridor. GI: Non-distended. Skin: Incision C/D/I this AM Psych: bright affect Musc: Lower extremity edema. Neuro: Alert Motor: Grossly 4-4+/5 throughout Sensation diminished to light touch in bilateral hands, ankles/feet  Assessment/Plan: 1. 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  Physiatrist is providing close team supervision and 24 hour management of active medical problems listed below.  Physiatrist and rehab team continue to assess barriers to discharge/monitor  patient progress toward functional and medical goals  Care Tool:  Bathing    Body parts bathed by patient: Right arm, Left arm, Abdomen, Chest, Right upper leg, Left upper leg, Face, Front perineal area, Buttocks, Right lower leg, Left lower leg   Body parts bathed by helper: Front perineal area, Buttocks, Right lower leg, Left lower leg     Bathing assist Assist Level: Minimal Assistance - Patient > 75%     Upper Body Dressing/Undressing Upper body dressing   What is the patient wearing?: Pull over shirt    Upper body assist Assist Level: Independent Assistive Device Comment: dressing stick  Lower Body Dressing/Undressing Lower body dressing      What is the patient wearing?: Underwear/pull up, Pants     Lower body assist Assist for lower body dressing: Minimal Assistance - Patient > 75%     Toileting Toileting    Toileting assist Assist for toileting: Contact Guard/Touching assist     Transfers Chair/bed transfer  Transfers assist     Chair/bed transfer assist level: Moderate Assistance - Patient 50 - 74%     Locomotion Ambulation   Ambulation assist      Assist level: Moderate Assistance - Patient 50 - 74% Assistive device: Parallel bars Max distance: 8   Walk 10 feet activity   Assist     Assist level: Minimal Assistance - Patient > 75% Assistive device: Walker-rolling   Walk 50 feet activity   Assist    Assist level: Minimal Assistance - Patient > 75% Assistive device: Walker-rolling    Walk  150 feet activity   Assist Walk 150 feet activity did not occur: Safety/medical concerns         Walk 10 feet on uneven surface  activity   Assist Walk 10 feet on uneven surfaces activity did not occur: Safety/medical concerns         Wheelchair     Assist Will patient use wheelchair at discharge?: Yes Type of Wheelchair: Manual    Wheelchair assist level: Supervision/Verbal cueing Max wheelchair distance: 150 ft     Wheelchair 50 feet with 2 turns activity    Assist        Assist Level: Supervision/Verbal cueing   Wheelchair 150 feet activity     Assist      Assist Level: Supervision/Verbal cueing   Blood pressure (!) 168/82, pulse (!) 49, temperature 97.6 F (36.4 C), resp. rate 19, height 5\' 7"  (1.702 m), weight 111.1 kg, SpO2 100 %.  Medical Problem List and Plan: 1.  Impaired mobility and ADLs secondary to cervical myelopathy            Continue CIR 2.  Antithrombotics: -DVT/anticoagulation:  Pharmaceutical: Cont Heparin               Restarted Coumadin home dose of 3mg  Mondays and Fridays on 12/25 with a goal of 2-3.   INR remained subtherapeutic on 12/27, labs pending for today  12/29- INR 2.0  12/30- will call [facility]U about concern for drainage since has been 15 days and because having increased numbness up to knees- waiting for response  12/31- INR 2.8- pharmacy is decreasing dose- might need decreased more due to Keflex  1/1- INR 2.9  -antiplatelet therapy: ASA 3. Pain Management:  Oxycodone prn  Flexeril changed to Skelaxin 800 mg TID prn for muscle spasms/tightness due to lethargy  Added Lyrica 25mg  for burning pain in right hand and right leg on 12/27 with improvement  12/29- pt refusing Lyrica- will stop for now. 4. Mood: LCSW to follow for evaluation and support.              -antipsychotic agents: NA 5. Neuropsych: This patient is capable of making decisions on her own behalf. 6. Skin/Wound Care: routine pressure relief measures.  7. Fluids/Electrolytes/Nutrition: Monitor I/O. 8.  Subdural/intradural collection with cord compression: IV decadron 10 mg every 4 hours changed to p.o. every 6 hours on 12/22, decreased to 3 times daily on 12/28 9. Hyperglycemia: has h/o diet controlled DM in the past. Will monitor BS ac/hs as now on steroids. CBG (last 3)  Recent Labs    04/14/19 1705 04/14/19 2102 04/15/19 0605  GLUCAP 143* 206* 123*      1/1- better  controlled overall 10.  OSA: continue CPAP at nights.  12/22- pt refusing- said wants 2L O2 at night.   11. PAF: On Bystolic bid.  Monitor HR TID--continue to hold coumadin.  12. H/o iron deficiency anemia:  Hemoglobin 11.0 on 12/27, continue to monitor 13. H/o Anxiety disorder: Has been stable on Zoloft.  14. Hypokalemia: Kdur increased to 40 meq bid, DC'd on 12/25  Potassium 3.9 on 12/25, continue to monitor   12/29- K+ was 3.3 on 12/28- -was repleted 12/28  12/30- K+ 3.7 15. Thrombocytopenia:   Platelets 148 on 12/27, continue to monitor  1/1- Plts 127 dwon from 145k 16. HTN/Peripheral edema: On Demadex daily   Labile on 12/28 17. Atrial fibrillation: Coumadin restarted 12/25 (see #2) 18. Seroma?- per NSU, will start Keflex- due to Renal impairment, will  do 500 mg BID x 7 days and needs f/u with Osterguard in 7 days. Will check labs in AM to f/u on WBC and renal fxn  1/1- WBC down to 11.4 from 12.1; Cr 1.04- improved from 1.25 19. Constipation  1/1- ask for prns if no BM by tonight    LOS: 11 days A FACE TO FACE EVALUATION WAS PERFORMED  Natasha Lawson 04/15/2019, 9:39 AM

## 2019-04-15 NOTE — Progress Notes (Signed)
Physical Therapy Session Note  Patient Details  Name: Natasha Lawson MRN: [id number] Date of Birth: 10-May-1940  Today's Date: 04/15/2019 PT Individual Time: 1303-1403 and 810 to 910 PT Individual Time Calculation (min): 60 min and 20mn  Short Term Goals: Week 1:  PT Short Term Goal 1 (Week 1): Pt will complete bed mobility with min A PT Short Term Goal 1 - Progress (Week 1): Met PT Short Term Goal 2 (Week 1): Pt will ambulate x 100 ft with LRAD and assist x 1 PT Short Term Goal 2 - Progress (Week 1): Progressing toward goal PT Short Term Goal 3 (Week 1): Pt will initiate stair training PT Short Term Goal 3 - Progress (Week 1): Met Week 2:  PT Short Term Goal 1 (Week 2): STG = LTG due to estimated d/c date. Week 3:     Skilled Therapeutic Interventions/Progress Updates:      Therapy Documentation Precautions:  Precautions Precautions: Cervical Required Braces or Orthoses: Cervical Brace Cervical Brace: For comfort, Soft collar Restrictions Weight Bearing Restrictions: No  PAIN  1/5 cervical spine  Pt initially supine and agreeable to treatment.  Pt incontinent of urine and requiring brief change.  Rolls L and R w/rails and mod assist for lower body.  Dependent for hygiene.  Supine to side to sit w/min assist. Maintains balance seated on edge of bed w/supervision while therapist donns ted hose and socks for pt.  SPT to BScommode w/RW and mod assist and cues for sts only, min assist for remainder of transfer.  Pt required total assist for hygiene.  STS w/RW w/min to mod assist and pt total assist for raising brief and pants.  Turn PT to wc w/min assist.   wc propulsion x [id number]/bilat ues for functional mobility training.  Transported remainder of way to gym for time management.   STS from wc w/min to mod assist and cues for mechanics.  Gait 4546f/RW and min assist but wc closely following due to proprioceptive deificits and LE weakness.  Pt walks w/steppage type gait and tends to lock  knees into extension throughout stance phase    Step ups 3inch step 20x 2 sets for LE strengthening wc propulsion x [id number] LEs forwards for hamstring strengthening, bacwards for quad strengthening x 75 ft Pt returned to room and left OOB in wc w/needs in reach and alarm set.     PM Session: Pain - denies pain  Pt initially supine in bed.  Supine to side to sit w/cues and min assist.  SPT to wc w/mod assist w/STS, w/min assist for balance and cues for sequencing to optimize momentum.   wc propulsion x 125 ft en route to gym .  In gym, pt worked on repeated STS in parallel bars using single hand push up from wc and single hand on bars to promote ant wt shift and allow for repeated efforts/LE strengthening.  Repeated x 6, 2 sets.  Noted pt w/skin tear RUE forearm.  Therapist cleaned w/sterile saline gauze and dressed w/gauze and kerlix.   Seated therex 5.b wt knee extension x 15 each side w/isometric hold x 2 count at end range. wc to/from mat w/min assist, cues for momentum/sequencing w/sts. Scooting L/R in sitting for wt shifting practice. Sit to supine w/mod assist.  Supine therex: Hip abd/add w/sliding board to decrease friction x15 each Hip and knee flexion (heel slides) w/sliding board x 15 each. Supine to side to sit w/mod assist due to UE weakness w/this approach.  Mat to wc w/mod assist for sts and min assist to complete transfer. Pt returned to room and left OOB in wc w/alarm set and needs in reach.  Pt gradually improving w/STS but primary limiting factors to safety with transfers/gait are LE sensory and strength deficits.  Very high fall risk due to proprioceptive issues.  Did report increased sensory perception in LE's described as "creepy feeling" that is new as well as decreased "stiffness" in hands.    Therapy/Group: Individual Therapy  Barbara Gallagher, PT 04/15/2019, 3:49 PM  

## 2019-04-15 NOTE — Progress Notes (Signed)
Recreational Therapy Session Note  Patient Details  Name: Natasha Lawson MRN: [id number] Date of Birth: 24-Apr-1940 Today's Date: 04/15/2019 Time:  1005-1038 Pain: no c/o Skilled Therapeutic Interventions/Progress Updates: Session focused on leisure education, activity analysis with potential modifications, energy conservation, coping strategies & discharge planning with Mod I.  Extensive discussion on awareness of self, recognizing stressors, stress response and stress management.  Pt very appreciative of discussion.  Therapy/Group: Individual Therapy  Daden Mahany 04/15/2019, 11:24 AM

## 2019-04-15 NOTE — Progress Notes (Signed)
[place] for warfarin Indication: atrial fibrillation (CHADS2VASc = 4)  Patient Measurements: Height: 5\' 7"  (170.2 cm) Weight: 244 lb 14.9 oz (111.1 kg) IBW/kg (Calculated) : 61.6  Vital Signs: Temp: 97.6 F (36.4 C) (01/01 0312) BP: 168/82 (01/01 0312) Pulse Rate: 49 (01/01 0312)  Labs: Recent Labs    04/13/19 0527 04/13/19 0943 04/14/19 0551 04/15/19 0526  HGB  --  12.3  --  11.9*  HCT  --  38.3  --  35.9*  PLT  --  145*  --  127*  LABPROT 26.2*  --  29.6* 30.1*  INR 2.4*  --  2.8* 2.9*  CREATININE  --  1.25*  --  1.04*    Estimated Creatinine Clearance: 59.1 mL/min (A) (by C-G formula based on SCr of 1.04 mg/dL (H)).  Assessment: 79 yo F with AFib on warfarin PTA admitted with cervical laminectomy. Per neurology, pharmacy can resume warfarin on POD#10 (12/25). Nursing spoke with patient and she states PTA she was taking Warfarin 2 mg PO daily. Med hx states 2mg  daily except 3mg  on Mon/Fri.  INR increased slightly to 2.9 - remains therapeutic.  No bleeding reported.   Goal of Therapy:  INR 2-3 Monitor platelets by anticoagulation protocol: Yes   Plan:  Repeat Coumadin 1mg  PO today Daily PT / INR  Kemba Hoppes D. Mina Marble, PharmD, BCPS, [place] 04/15/2019, 1:09 PM

## 2019-04-16 ENCOUNTER — Inpatient Hospital Stay (HOSPITAL_COMMUNITY): Payer: PRIVATE HEALTH INSURANCE

## 2019-04-16 LAB — GLUCOSE, CAPILLARY
Glucose-Capillary: 180 mg/dL — ABNORMAL HIGH (ref 70–99)
Glucose-Capillary: 114 mg/dL — ABNORMAL HIGH (ref 70–99)
Glucose-Capillary: 174 mg/dL — ABNORMAL HIGH (ref 70–99)
Glucose-Capillary: 201 mg/dL — ABNORMAL HIGH (ref 70–99)

## 2019-04-16 LAB — PROTIME-INR
INR: 2.8 — ABNORMAL HIGH (ref 0.8–1.2)
Prothrombin Time: 29.3 seconds — ABNORMAL HIGH (ref 11.4–15.2)

## 2019-04-16 MED ORDER — WARFARIN SODIUM 2 MG PO TABS
2.0000 mg | ORAL_TABLET | Freq: Once | ORAL | Status: AC
  Administered 2019-04-16: 2 mg via ORAL
  Filled 2019-04-16: qty 1

## 2019-04-16 NOTE — Progress Notes (Signed)
[place] for warfarin Indication: atrial fibrillation (CHADS2VASc = 4)  Patient Measurements: Height: 5\' 7"  (170.2 cm) Weight: 244 lb 14.9 oz (111.1 kg) IBW/kg (Calculated) : 61.6  Vital Signs: Temp: 97.7 F (36.5 C) (01/02 0300) BP: 161/64 (01/02 0300) Pulse Rate: 50 (01/02 0300)  Labs: Recent Labs    04/13/19 0943 04/14/19 0551 04/15/19 0526 04/16/19 0325  HGB 12.3  --  11.9*  --   HCT 38.3  --  35.9*  --   PLT 145*  --  127*  --   LABPROT  --  29.6* 30.1* 29.3*  INR  --  2.8* 2.9* 2.8*  CREATININE 1.25*  --  1.04*  --     Estimated Creatinine Clearance: 59.1 mL/min (A) (by C-G formula based on SCr of 1.04 mg/dL (H)).  Assessment: 79 yo F with AFib on warfarin PTA admitted with cervical laminectomy. Per neurology, pharmacy can resume warfarin on POD#10 (12/25). Nursing spoke with patient and she states PTA she was taking Warfarin 2 mg PO daily. Med hx states 2mg  daily except 3mg  on Mon/Fri.  INR increased slightly to 2.8 - remains therapeutic but sl down trending.  No bleeding reported.   Goal of Therapy:  INR 2-3 Monitor platelets by anticoagulation protocol: Yes   Plan:  Repeat Coumadin 2mg  PO today Daily PT / INR  Maryanna Shape, PharmD, BCPS, BCPPS Clinical Pharmacist  Pager: [phone]   04/16/2019, 8:31 AM

## 2019-04-16 NOTE — Progress Notes (Signed)
[facility] PHYSICAL MEDICINE & REHABILITATION PROGRESS NOTE   Subjective/Complaints:  Pt's LBM was yesterday AM- large- overall feeling good/better daily- sensation is getting better daily- broke the skin on R arm wound yesterday during therapy- is OK.  Has a band of numbness still in feet/ankles. .   ROS: +numbness improved today Denies CP, shortness of breath, nausea, vomiting, diarrhea.   Objective:   No results found. Recent Labs    04/15/19 0526  WBC 11.4*  HGB 11.9*  HCT 35.9*  PLT 127*   Recent Labs    04/15/19 0526  NA 139  K 4.5  CL 102  CO2 27  GLUCOSE 137*  BUN 42*  CREATININE 1.04*  CALCIUM 8.5*    Intake/Output Summary (Last 24 hours) at 04/16/2019 1052 Last data filed at 04/16/2019 0700 Gross per 24 hour  Intake 640 ml  Output --  Net 640 ml     Physical Exam: Vital Signs Blood pressure (!) 161/64, pulse (!) 50, temperature 97.7 F (36.5 C), resp. rate 19, height 5\' 7"  (1.702 m), weight 111.1 kg, SpO2 98 %. Constitutional: No distress . Vital signs reviewed.  Morbidly obese. In bed; just finishing breakfast, bright affect, NAD HENT: Normocephalic.  Atraumatic. Eyes: EOMI. No discharge. Cardiovascular: irregular but regular rate Respiratory: Normal effort.  No stridor. CTA B/L GI: Non-distended. Skin: Incision - covered, but drainage is soaking through banadage a little Psych: bright affect Musc: Lower extremity edema. Neuro: Alert Motor: Grossly 4-4+/5 throughout Sensation diminished to light touch in bilateral hands, ankles/feet  Assessment/Plan: 1. 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  Physiatrist is providing close team supervision and 24 hour management of active medical problems listed below.  Physiatrist and rehab team continue to assess barriers to  discharge/monitor patient progress toward functional and medical goals  Care Tool:  Bathing    Body parts bathed by patient: Right arm, Left arm, Abdomen, Chest, Right upper leg, Left upper leg, Face, Front perineal area, Buttocks, Right lower leg, Left lower leg   Body parts bathed by helper: Front perineal area, Buttocks, Right lower leg, Left lower leg     Bathing assist Assist Level: Minimal Assistance - Patient > 75%     Upper Body Dressing/Undressing Upper body dressing   What is the patient wearing?: Pull over shirt    Upper body assist Assist Level: Independent Assistive Device Comment: dressing stick  Lower Body Dressing/Undressing Lower body dressing      What is the patient wearing?: Underwear/pull up, Pants     Lower body assist Assist for lower body dressing: Minimal Assistance - Patient > 75%     Toileting Toileting    Toileting assist Assist for toileting: Contact Guard/Touching assist     Transfers Chair/bed transfer  Transfers assist     Chair/bed transfer assist level: Moderate Assistance - Patient 50 - 74%     Locomotion Ambulation   Ambulation assist      Assist level: 2 helpers Assistive device: Parallel bars Max distance: 8   Walk 10 feet activity   Assist     Assist level: 2 helpers Assistive device: Walker-rolling   Walk 50 feet activity   Assist    Assist level: 2 helpers Assistive device: Walker-rolling    Walk 150 feet activity   Assist Walk 150 feet activity did not occur: Safety/medical concerns  Walk 10 feet on uneven surface  activity   Assist Walk 10 feet on uneven surfaces activity did not occur: Safety/medical concerns         Wheelchair     Assist Will patient use wheelchair at discharge?: Yes Type of Wheelchair: Manual    Wheelchair assist level: Supervision/Verbal cueing Max wheelchair distance: 150 ft    Wheelchair 50 feet with 2 turns activity    Assist         Assist Level: Supervision/Verbal cueing   Wheelchair 150 feet activity     Assist      Assist Level: Supervision/Verbal cueing   Blood pressure (!) 161/64, pulse (!) 50, temperature 97.7 F (36.5 C), resp. rate 19, height 5\' 7"  (1.702 m), weight 111.1 kg, SpO2 98 %.  Medical Problem List and Plan: 1.  Impaired mobility and ADLs secondary to cervical myelopathy            Continue CIR 2.  Antithrombotics: -DVT/anticoagulation:  Pharmaceutical: Cont Heparin               Restarted Coumadin home dose of 3mg  Mondays and Fridays on 12/25 with a goal of 2-3.   INR remained subtherapeutic on 12/27, labs pending for today  12/29- INR 2.0  12/30- will call [facility] about concern for drainage since has been 15 days and because having increased numbness up to knees- waiting for response  12/31- INR 2.8- pharmacy is decreasing dose- might need decreased more due to Keflex  1/1- INR 2.9  1/2- INR 2.8  -antiplatelet therapy: ASA 3. Pain Management:  Oxycodone prn  Flexeril changed to Skelaxin 800 mg TID prn for muscle spasms/tightness due to lethargy  Added Lyrica 25mg  for burning pain in right hand and right leg on 12/27 with improvement  12/29- pt refusing Lyrica- will stop for now. 4. Mood: LCSW to follow for evaluation and support.              -antipsychotic agents: NA 5. Neuropsych: This patient is capable of making decisions on her own behalf. 6. Skin/Wound Care: routine pressure relief measures.  7. Fluids/Electrolytes/Nutrition: Monitor I/O. 8.  Subdural/intradural collection with cord compression: IV decadron 10 mg every 4 hours changed to p.o. every 6 hours on 12/22, decreased to 3 times daily on 12/28 9. Hyperglycemia: has h/o diet controlled DM in the past. Will monitor BS ac/hs as now on steroids. CBG (last 3)  Recent Labs    04/15/19 1616 04/15/19 2107 04/16/19 0608  GLUCAP 170* 268* 180*      1/2- better controlled overall- x1 >200 10.  OSA: continue CPAP at  nights.  12/22- pt refusing- said wants 2L O2 at night.   11. PAF: On Bystolic bid.  Monitor HR TID--continue to hold coumadin.  12. H/o iron deficiency anemia:  Hemoglobin 11.0 on 12/27, continue to monitor 13. H/o Anxiety disorder: Has been stable on Zoloft.  14. Hypokalemia: Kdur increased to 40 meq bid, DC'd on 12/25  Potassium 3.9 on 12/25, continue to monitor   12/29- K+ was 3.3 on 12/28- -was repleted 12/28  12/30- K+ 3.7'  1/2- reduced K+ since was increasing level to 4.5- now daily 15. Thrombocytopenia:   Platelets 148 on 12/27, continue to monitor  1/1- Plts 127 dwon from 145k 16. HTN/Peripheral edema: On Demadex daily   Labile on 12/28 17. Atrial fibrillation: Coumadin restarted 12/25 (see #2) 18. Seroma?- per NSU, will start Keflex- due to Renal impairment, will do 500 mg BID x  7 days and needs f/u with Osterguard in 7 days. Will check labs in AM to f/u on WBC and renal fxn  1/1- WBC down to 11.4 from 12.1; Cr 1.04- improved from 1.25 19. Constipation  1/1- ask for prns if no BM by tonight  1/2- LBM 1/1    LOS: 12 days A FACE TO FACE EVALUATION WAS PERFORMED  Athanasia Stanwood 04/16/2019, 10:52 AM

## 2019-04-17 ENCOUNTER — Inpatient Hospital Stay (HOSPITAL_COMMUNITY): Payer: PRIVATE HEALTH INSURANCE | Admitting: Occupational Therapy

## 2019-04-17 LAB — GLUCOSE, CAPILLARY
Glucose-Capillary: 158 mg/dL — ABNORMAL HIGH (ref 70–99)
Glucose-Capillary: 250 mg/dL — ABNORMAL HIGH (ref 70–99)
Glucose-Capillary: 112 mg/dL — ABNORMAL HIGH (ref 70–99)
Glucose-Capillary: 255 mg/dL — ABNORMAL HIGH (ref 70–99)

## 2019-04-17 LAB — PROTIME-INR
INR: 2.9 — ABNORMAL HIGH (ref 0.8–1.2)
Prothrombin Time: 30 seconds — ABNORMAL HIGH (ref 11.4–15.2)

## 2019-04-17 MED ORDER — WARFARIN SODIUM 1 MG PO TABS
1.0000 mg | ORAL_TABLET | Freq: Once | ORAL | Status: AC
  Administered 2019-04-17: 1 mg via ORAL
  Filled 2019-04-17: qty 1

## 2019-04-17 NOTE — Progress Notes (Signed)
Occupational Therapy Session Note  Patient Details  Name: Natasha Lawson MRN: [id number] Date of Birth: 09/11/1940  Today's Date: 04/17/2019 OT Individual Time: 1001-1058 OT Individual Time Calculation (min): 57 min   Skilled Therapeutic Interventions/Progress Updates:    Pt greeted in bed and premedicated for pain. Requesting to shower. Supine<sit completed with supervision assist. Mod A for sit<stand with RW, and Min A for ambulation to TTB with increased time. Pt bathed at sit<stand level, using LH sponge as needed. Noted she used LH sponge to also complete perihygiene with OT supplying her with an additional sponge for the rest of her body. Steady assist for dynamic standing balance with unilateral support on grab bar. Afterwards she completed stand pivot<w/c with Min A. Dressing completed w/c level sit<stand using RW and AE as needed. Min and Mod A for sit<stands with pt initiating scooting forward. Assist for brief and gripper socks. Oral care completed while seated at sink to conserve energy. At end of session pt opted to remain up in the w/c. Left her with all needs within reach. Tx focus placed on dynamic standing balance, sit<stands, and adaptive self care skills.   RN in after shower to change pts cervical dressings.   Therapy Documentation Precautions:  Precautions Precautions: Cervical Required Braces or Orthoses: Cervical Brace Cervical Brace: For comfort, Soft collar Restrictions Weight Bearing Restrictions: No ADL:     Therapy/Group: Individual Therapy  Nanci Lakatos A Tikesha Mort 04/17/2019, 12:15 PM

## 2019-04-17 NOTE — Progress Notes (Signed)
[facility] PHYSICAL MEDICINE & REHABILITATION PROGRESS NOTE   Subjective/Complaints:  Pt reports doing well- facetiming with husband- sitting up in bed- feels pretty "good" and numbness and "leaking out of hands".  Bladder also better- has some discomfort in L neck with ROM- not pain.  ROS: +numbness improved today Denies CP, shortness of breath, nausea, vomiting, diarrhea.   Objective:   No results found. Recent Labs    04/15/19 0526  WBC 11.4*  HGB 11.9*  HCT 35.9*  PLT 127*   Recent Labs    04/15/19 0526  NA 139  K 4.5  CL 102  CO2 27  GLUCOSE 137*  BUN 42*  CREATININE 1.04*  CALCIUM 8.5*    Intake/Output Summary (Last 24 hours) at 04/17/2019 1032 Last data filed at 04/17/2019 0900 Gross per 24 hour  Intake 900 ml  Output 1 ml  Net 899 ml     Physical Exam: Vital Signs Blood pressure (!) 158/73, pulse (!) 53, temperature 97.7 F (36.5 C), temperature source Oral, resp. rate 16, height 5\' 7"  (1.702 m), weight 111.1 kg, SpO2 99 %. Constitutional: No distress . Vital signs reviewed.  Morbidly obese. In bed; sitting up talking to husband on facetime, NAD HENT: Normocephalic.  Atraumatic. Eyes: EOMI. No discharge. Cardiovascular: irregular but regular rate Respiratory: Normal effort.  No stridor. CTA B/L GI: Non-distended. Skin: Incision - C/D/I Psych: bright affect Musc: Lower extremity edema. Neuro: Alert Motor: Grossly 4-4+/5 throughout Sensation diminished to light touch in bilateral hands, ankles/feet  Assessment/Plan: 1. 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  Physiatrist is providing close team supervision and 24 hour management of active medical problems listed below.  Physiatrist and rehab team continue to assess barriers to discharge/monitor patient progress toward  functional and medical goals  Care Tool:  Bathing    Body parts bathed by patient: Right arm, Left arm, Abdomen, Chest, Right upper leg, Left upper leg, Face, Front perineal area, Buttocks, Right lower leg, Left lower leg   Body parts bathed by helper: Front perineal area, Buttocks, Right lower leg, Left lower leg     Bathing assist Assist Level: Minimal Assistance - Patient > 75%     Upper Body Dressing/Undressing Upper body dressing   What is the patient wearing?: Pull over shirt    Upper body assist Assist Level: Independent Assistive Device Comment: dressing stick  Lower Body Dressing/Undressing Lower body dressing      What is the patient wearing?: Underwear/pull up, Pants     Lower body assist Assist for lower body dressing: Minimal Assistance - Patient > 75%     Toileting Toileting    Toileting assist Assist for toileting: Contact Guard/Touching assist     Transfers Chair/bed transfer  Transfers assist     Chair/bed transfer assist level: Moderate Assistance - Patient 50 - 74%     Locomotion Ambulation   Ambulation assist      Assist level: 2 helpers Assistive device: Parallel bars Max distance: 8   Walk 10 feet activity   Assist     Assist level: 2 helpers Assistive device: Walker-rolling   Walk 50 feet activity   Assist    Assist level: 2 helpers Assistive device: Walker-rolling    Walk 150 feet activity   Assist Walk 150 feet activity did not occur: Safety/medical concerns         Walk  10 feet on uneven surface  activity   Assist Walk 10 feet on uneven surfaces activity did not occur: Safety/medical concerns         Wheelchair     Assist Will patient use wheelchair at discharge?: Yes Type of Wheelchair: Manual    Wheelchair assist level: Supervision/Verbal cueing Max wheelchair distance: 150 ft    Wheelchair 50 feet with 2 turns activity    Assist        Assist Level: Supervision/Verbal  cueing   Wheelchair 150 feet activity     Assist      Assist Level: Supervision/Verbal cueing   Blood pressure (!) 158/73, pulse (!) 53, temperature 97.7 F (36.5 C), temperature source Oral, resp. rate 16, height 5\' 7"  (1.702 m), weight 111.1 kg, SpO2 99 %.  Medical Problem List and Plan: 1.  Impaired mobility and ADLs secondary to cervical myelopathy            Continue CIR 2.  Antithrombotics: -DVT/anticoagulation:  Pharmaceutical: Cont Heparin               Restarted Coumadin home dose of 3mg  Mondays and Fridays on 12/25 with a goal of 2-3.   INR remained subtherapeutic on 12/27, labs pending for today  12/29- INR 2.0  12/30- will call NSU about concern for drainage since has been 15 days and because having increased numbness up to knees- waiting for response  1/2- INR 2.8  13- INR 2.9  -antiplatelet therapy: ASA 3. Pain Management:  Oxycodone prn  Flexeril changed to Skelaxin 800 mg TID prn for muscle spasms/tightness due to lethargy  Added Lyrica 25mg  for burning pain in right hand and right leg on 12/27 with improvement  12/29- pt refusing Lyrica- will stop for now. 4. Mood: LCSW to follow for evaluation and support.              -antipsychotic agents: NA 5. Neuropsych: This patient is capable of making decisions on her own behalf. 6. Skin/Wound Care: routine pressure relief measures.  7. Fluids/Electrolytes/Nutrition: Monitor I/O. 8.  Subdural/intradural collection with cord compression: IV decadron 10 mg every 4 hours changed to p.o. every 6 hours on 12/22, decreased to 3 times daily on 12/28 9. Hyperglycemia: has h/o diet controlled DM in the past. Will monitor BS ac/hs as now on steroids. CBG (last 3)  Recent Labs    04/16/19 1703 04/16/19 2107 04/17/19 0637  GLUCAP 174* 201* 158*      1/2- better controlled overall- x1 >200- was 201 10.  OSA: continue CPAP at nights.  12/22- pt refusing- said wants 2L O2 at night.   11. PAF: On Bystolic bid.  Monitor HR  TID--continue to hold coumadin.  12. H/o iron deficiency anemia:  Hemoglobin 11.0 on 12/27, continue to monitor 13. H/o Anxiety disorder: Has been stable on Zoloft.  14. Hypokalemia: Kdur increased to 40 meq bid, DC'd on 12/25  Potassium 3.9 on 12/25, continue to monitor   12/29- K+ was 3.3 on 12/28- -was repleted 12/28  12/30- K+ 3.7'  1/2- reduced K+ since was increasing level to 4.5- now daily 15. Thrombocytopenia:   Platelets 148 on 12/27, continue to monitor  1/1- Plts 127 dwon from 145k 16. HTN/Peripheral edema: On Demadex daily   Labile on 12/28 17. Atrial fibrillation: Coumadin restarted 12/25 (see #2) 18. Seroma?- per NSU, will start Keflex- due to Renal impairment, will do 500 mg BID x 7 days and needs f/u with Osterguard in 7 days. Will  check labs in AM to f/u on WBC and renal fxn  1/1- WBC down to 11.4 from 12.1; Cr 1.04- improved from 1.25  1/3- labs q Monday 19. Constipation  1/1- ask for prns if no BM by tonight  1/3- LBM 1/2 in afternoon    LOS: 13 days A FACE TO FACE EVALUATION WAS PERFORMED  Natasha Lawson 04/17/2019, 10:32 AM

## 2019-04-17 NOTE — Progress Notes (Signed)
[place] for warfarin Indication: atrial fibrillation (CHADS2VASc = 4)  Patient Measurements: Height: 5\' 7"  (170.2 cm) Weight: 244 lb 14.9 oz (111.1 kg) IBW/kg (Calculated) : 61.6  Vital Signs: Temp: 97.7 F (36.5 C) (01/03 0522) Temp Source: Oral (01/03 0522) BP: 158/73 (01/03 0522) Pulse Rate: 53 (01/03 0522)  Labs: Recent Labs    04/15/19 0526 04/16/19 0325 04/17/19 0644  HGB 11.9*  --   --   HCT 35.9*  --   --   PLT 127*  --   --   LABPROT 30.1* 29.3* 30.0*  INR 2.9* 2.8* 2.9*  CREATININE 1.04*  --   --     Estimated Creatinine Clearance: 59.1 mL/min (A) (by C-G formula based on SCr of 1.04 mg/dL (H)).  Assessment: 79 yo F with AFib on warfarin PTA admitted with cervical laminectomy. Per neurology, pharmacy can resume warfarin on POD#10 (12/25). Nursing spoke with patient and she states PTA she was taking Warfarin 2 mg PO daily. Med hx states 2mg  daily except 3mg  on Mon/Fri.  INR therapeutic 2.9. No reported bleeding or bruising   Goal of Therapy:  INR 2-3 Monitor platelets by anticoagulation protocol: Yes   Plan:  Warfarin 1mg  x1 tonight Daily PT / INR  Georga Bora, PharmD Clinical Pharmacist 04/17/2019 8:44 AM Please check AMION for all [place] numbers

## 2019-04-18 ENCOUNTER — Inpatient Hospital Stay (HOSPITAL_COMMUNITY): Payer: PRIVATE HEALTH INSURANCE

## 2019-04-18 ENCOUNTER — Inpatient Hospital Stay (HOSPITAL_COMMUNITY): Payer: PRIVATE HEALTH INSURANCE | Admitting: Physical Therapy

## 2019-04-18 LAB — CBC
HCT: 35.8 % — ABNORMAL LOW (ref 36.0–46.0)
Hemoglobin: 11.6 g/dL — ABNORMAL LOW (ref 12.0–15.0)
MCH: 33 pg (ref 26.0–34.0)
MCHC: 32.4 g/dL (ref 30.0–36.0)
MCV: 102 fL — ABNORMAL HIGH (ref 80.0–100.0)
Platelets: 128 10*3/uL — ABNORMAL LOW (ref 150–400)
RBC: 3.51 MIL/uL — ABNORMAL LOW (ref 3.87–5.11)
RDW: 15 % (ref 11.5–15.5)
WBC: 10.4 10*3/uL (ref 4.0–10.5)
nRBC: 0 % (ref 0.0–0.2)

## 2019-04-18 LAB — GLUCOSE, CAPILLARY
Glucose-Capillary: 144 mg/dL — ABNORMAL HIGH (ref 70–99)
Glucose-Capillary: 132 mg/dL — ABNORMAL HIGH (ref 70–99)
Glucose-Capillary: 163 mg/dL — ABNORMAL HIGH (ref 70–99)
Glucose-Capillary: 189 mg/dL — ABNORMAL HIGH (ref 70–99)

## 2019-04-18 LAB — PROTIME-INR
INR: 3.1 — ABNORMAL HIGH (ref 0.8–1.2)
Prothrombin Time: 32.1 seconds — ABNORMAL HIGH (ref 11.4–15.2)

## 2019-04-18 LAB — BASIC METABOLIC PANEL
Anion gap: 9 (ref 5–15)
BUN: 50 mg/dL — ABNORMAL HIGH (ref 8–23)
CO2: 27 mmol/L (ref 22–32)
Calcium: 8.4 mg/dL — ABNORMAL LOW (ref 8.9–10.3)
Chloride: 106 mmol/L (ref 98–111)
Creatinine, Ser: 1.09 mg/dL — ABNORMAL HIGH (ref 0.44–1.00)
GFR calc Af Amer: 57 mL/min — ABNORMAL LOW (ref >60)
GFR calc non Af Amer: 49 mL/min — ABNORMAL LOW (ref >60)
Glucose, Bld: 150 mg/dL — ABNORMAL HIGH (ref 70–99)
Potassium: 4.2 mmol/L (ref 3.5–5.1)
Sodium: 142 mmol/L (ref 135–145)

## 2019-04-18 MED ORDER — OXYCODONE HCL 5 MG PO TABS: 5.0000 mg | ORAL_TABLET | Freq: Four times a day (QID) | ORAL | Status: DC | PRN

## 2019-04-18 MED ORDER — TORSEMIDE 20 MG PO TABS
20.0000 mg | ORAL_TABLET | Freq: Every day | ORAL | Status: DC
  Administered 2019-04-20 – 2019-04-21 (×2): 20 mg via ORAL
  Filled 2019-04-18 (×2): qty 1

## 2019-04-18 MED ORDER — WARFARIN 0.5 MG HALF TABLET
0.5000 mg | ORAL_TABLET | Freq: Once | ORAL | Status: AC
  Administered 2019-04-18: 18:00:00 0.5 mg via ORAL
  Filled 2019-04-18: qty 1

## 2019-04-18 NOTE — Progress Notes (Signed)
[place] for warfarin Indication: atrial fibrillation (CHADS2VASc = 4)  Patient Measurements: Height: 5\' 7"  (170.2 cm) Weight: 244 lb 14.9 oz (111.1 kg) IBW/kg (Calculated) : 61.6  Vital Signs: Temp: 98 F (36.7 C) (01/04 0514) Temp Source: Oral (01/04 0514) BP: 142/61 (01/04 0514) Pulse Rate: 55 (01/04 0514)  Labs: Recent Labs    04/16/19 0325 04/17/19 0644 04/18/19 0609  HGB  --   --  11.6*  HCT  --   --  35.8*  PLT  --   --  128*  LABPROT 29.3* 30.0* 32.1*  INR 2.8* 2.9* 3.1*  CREATININE  --   --  1.09*    Estimated Creatinine Clearance: 56.4 mL/min (A) (by C-G formula based on SCr of 1.09 mg/dL (H)).  Assessment: 79 yo F with AFib on warfarin PTA admitted with cervical laminectomy. Per neurology, pharmacy can resume warfarin on POD#10 (12/25). Nursing spoke with patient and she states PTA she was taking Warfarin 2 mg PO daily. Med hx states 2mg  daily except 3mg  on Mon/Fri.  INR up slightly to 3.1  Goal of Therapy:  INR 2-3 Monitor platelets by anticoagulation protocol: Yes   Plan:  Warfarin 0.5 mg po x 1 Daily PT / INR  Anette Guarneri, PharmD Clinical Pharmacist 04/18/2019 1:14 PM Please check AMION for all [place] numbers

## 2019-04-18 NOTE — Plan of Care (Signed)
  Problem: Consults Goal: RH SPINAL CORD INJURY PATIENT EDUCATION Description:  See Patient Education module for education specifics.  Outcome: Progressing Goal: Skin Care Protocol Initiated - if Braden Score 18 or less Description: If consults are not indicated, leave blank or document N/A Outcome: Progressing   Problem: SCI BOWEL ELIMINATION Goal: RH STG MANAGE BOWEL WITH ASSISTANCE Description: STG Manage Bowel with mod I Assistance. Outcome: Progressing   Problem: SCI BLADDER ELIMINATION Goal: RH STG MANAGE BLADDER WITH ASSISTANCE Description: STG Manage Bladder With mod I Assistance Outcome: Progressing   Problem: RH SAFETY Goal: RH STG ADHERE TO SAFETY PRECAUTIONS W/ASSISTANCE/DEVICE Description: STG Adhere to Safety Precautions With mod I Assistance/Device. Outcome: Progressing Goal: RH STG DECREASED RISK OF FALL WITH ASSISTANCE Description: STG Decreased Risk of Fall With mod I Assistance. Outcome: Progressing   Problem: RH SKIN INTEGRITY Goal: RH STG SKIN FREE OF INFECTION/BREAKDOWN Description: Patients skin will remain free from further infection or breakdown with mod I assist. Outcome: Progressing Goal: RH STG MAINTAIN SKIN INTEGRITY WITH ASSISTANCE Description: STG Maintain Skin Integrity With mod I Assistance. Outcome: Progressing   Problem: RH PAIN MANAGEMENT Goal: RH STG PAIN MANAGED AT OR BELOW PT'S PAIN GOAL Description: < 4 Outcome: Progressing

## 2019-04-18 NOTE — Progress Notes (Signed)
Occupational Therapy Session Note  Patient Details  Name: Natasha Lawson MRN: [id number] Date of Birth: [birth year]  Today's Date: 04/18/2019 OT Individual Time: 0700-0755 OT Individual Time Calculation (min): 55 min    Short Term Goals: Week 2:  OT Short Term Goal 1 (Week 2): STG=LTG due to LOS  Skilled Therapeutic Interventions/Progress Updates:    OT intervention with focus on functional transfers, BADL tranining, sit<>stand, standing balance, discharge planning, home safety, activity tolerance, and safety awareness to increase independence with BADLs. Sit<>stand with RW at [facility]. Stand pivot transfer with RW at [facility]. Standing balance for LB bahting/dressing tasks with CTA. Pt will not be able to access bathroom at w/c level and will be using BSC.  Pt will continue sink baths until able to access bathroom.  Pt's granddaughter will be assisting at home. Pt remained in w/c with all needs within reach.   Therapy Documentation Precautions:  Precautions Precautions: Cervical Required Braces or Orthoses: Cervical Brace Cervical Brace: For comfort, Soft collar Restrictions Weight Bearing Restrictions: No General:   Vital Signs: Therapy Vitals Temp: 98 F (36.7 C) Temp Source: Oral Pulse Rate: (!) 55 BP: (!) 142/61 Oxygen Therapy SpO2: 100 % Pain:  Pt c/o "a little soreness" in back of neck; emotional support, RN aware   Therapy/Group: Individual Therapy  Leroy Libman 04/18/2019, 7:53 AM

## 2019-04-18 NOTE — Progress Notes (Signed)
  Patient ID: Natasha Lawson, female   DOB: 1940/08/09, 79 y.o.   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  A walker will not resolve issue with performing activities of daily living.  A wheelchair will allow patient to safely perform daily activities.  Patient is not able to propel themselves in the home using a standard weight wheelchair due to upper extremity weakness.  Patient can self propel in the lightweight wheelchair.  Reesa Chew, PA-C

## 2019-04-18 NOTE — Progress Notes (Signed)
[place] PHYSICAL MEDICINE & REHABILITATION PROGRESS NOTE   Subjective/Complaints: Patient reports that she feels 220% better today.  Pain is well controlled.  Moving bowels regularly.  Feels that her bladder function is starting to normalize.  Hgb 11.6, stable.  Cr 1.09, stable.   ROS: +numbness improved today Denies CP, shortness of breath, nausea, vomiting, diarrhea.   Objective:   No results found. Recent Labs    04/18/19 0609  WBC 10.4  HGB 11.6*  HCT 35.8*  PLT 128*   Recent Labs    04/18/19 0609  NA 142  K 4.2  CL 106  CO2 27  GLUCOSE 150*  BUN 50*  CREATININE 1.09*  CALCIUM 8.4*    Intake/Output Summary (Last 24 hours) at 04/18/2019 0912 Last data filed at 04/17/2019 1700 Gross per 24 hour  Intake 490 ml  Output --  Net 490 ml     Physical Exam: Vital Signs Blood pressure (!) 142/61, pulse (!) 55, temperature 98 F (36.7 C), temperature source Oral, resp. rate 18, height 5\' 7"  (1.702 m), weight 111.1 kg, SpO2 100 %. Constitutional: No distress . Vital signs reviewed.  Morbidly obese. Sitting up in [place].  HENT: Normocephalic.  Atraumatic. Eyes: EOMI. No discharge. Cardiovascular: irregular but regular rate Respiratory: Normal effort.  No stridor. CTA B/L GI: Non-distended. Skin: Incision - C/D/I Psych: bright affect Musc: Lower extremity edema. Neuro: Alert Motor: Grossly 4-4+/5 throughout Sensation diminished to light touch in bilateral hands, ankles/feet  Assessment/Plan: 1. 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  Physiatrist is providing close team supervision and 24 hour management of active medical problems listed below.  Physiatrist and rehab team continue to assess barriers to discharge/monitor patient progress toward functional and medical goals  Care  Tool:  Bathing    Body parts bathed by patient: Right arm, Left arm, Abdomen, Chest, Right upper leg, Left upper leg, Face, Front perineal area, Buttocks, Right lower leg, Left lower leg   Body parts bathed by helper: Front perineal area, Buttocks, Right lower leg, Left lower leg     Bathing assist Assist Level: Contact Guard/Touching assist     Upper Body Dressing/Undressing Upper body dressing   What is the patient wearing?: Pull over shirt    Upper body assist Assist Level: Independent Assistive Device Comment: dressing stick  Lower Body Dressing/Undressing Lower body dressing      What is the patient wearing?: Underwear/pull up, Pants     Lower body assist Assist for lower body dressing: Contact Guard/Touching assist     Toileting Toileting    Toileting assist Assist for toileting: Contact Guard/Touching assist     Transfers Chair/bed transfer  Transfers assist     Chair/bed transfer assist level: Moderate Assistance - Patient 50 - 74%     Locomotion Ambulation   Ambulation assist      Assist level: 2 helpers Assistive device: Parallel bars Max distance: 8   Walk 10 feet activity   Assist     Assist level: 2 helpers Assistive device: Walker-rolling   Walk 50 feet activity   Assist    Assist level: 2 helpers Assistive device: Walker-rolling    Walk 150 feet activity   Assist Walk 150 feet activity did not occur: Safety/medical concerns         Walk 10 feet on uneven surface  activity   Assist Walk 10  feet on uneven surfaces activity did not occur: Safety/medical concerns         Wheelchair     Assist Will patient use wheelchair at discharge?: Yes Type of Wheelchair: Manual    Wheelchair assist level: Supervision/Verbal cueing Max wheelchair distance: 150 ft    Wheelchair 50 feet with 2 turns activity    Assist        Assist Level: Supervision/Verbal cueing   Wheelchair 150 feet activity      Assist      Assist Level: Supervision/Verbal cueing   Blood pressure (!) 142/61, pulse (!) 55, temperature 98 F (36.7 C), temperature source Oral, resp. rate 18, height 5\' 7"  (1.702 m), weight 111.1 kg, SpO2 100 %.  Medical Problem List and Plan: 1.  Impaired mobility and ADLs secondary to cervical myelopathy            Continue CIR 2.  Antithrombotics: -DVT/anticoagulation:  Pharmaceutical: Cont Heparin               Restarted Coumadin home dose of 3mg  Mondays and Fridays on 12/25 with a goal of 2-3.   INR remained subtherapeutic on 12/27, labs pending for today  12/29- INR 2.0  12/30- will call NSU about concern for drainage since has been 15 days and because having increased numbness up to knees- waiting for response  1/2- INR 2.8  1/3- INR 2.9  1/4: INR 3.1  -antiplatelet therapy: ASA 3. Pain Management:  Oxycodone prn  Flexeril changed to Skelaxin 800 mg TID prn for muscle spasms/tightness due to lethargy  Added Lyrica 25mg  for burning pain in right hand and right leg on 12/27 with improvement  12/29- pt refusing Lyrica- will stop for now. 4. Mood: LCSW to follow for evaluation and support.              -antipsychotic agents: NA 5. Neuropsych: This patient is capable of making decisions on her own behalf. 6. Skin/Wound Care: routine pressure relief measures.  7. Fluids/Electrolytes/Nutrition: Monitor I/O. 8.  Subdural/intradural collection with cord compression: IV decadron 10 mg every 4 hours changed to p.o. every 6 hours on 12/22, decreased to 3 times daily on 12/28 9. Hyperglycemia: has h/o diet controlled DM in the past. Will monitor BS ac/hs as now on steroids. CBG (last 3)  Recent Labs    04/17/19 1720 04/17/19 2140 04/18/19 0603  GLUCAP 112* 255* 144*    1/2- better controlled overall- x1 >200- was 201  1/4: well controlled 10.  OSA: continue CPAP at nights.  12/22- pt refusing- said wants 2L O2 at night.   11. PAF: On Bystolic bid.  Monitor HR  TID--continue to hold coumadin.  12. H/o iron deficiency anemia:  Hemoglobin 11.0 on 12/27, continue to monitor 13. H/o Anxiety disorder: Has been stable on Zoloft.  14. Hypokalemia: Kdur increased to 40 meq bid, DC'd on 12/25  Potassium 3.9 on 12/25, continue to monitor   12/29- K+ was 3.3 on 12/28- -was repleted 12/28  12/30- K+ 3.7'  1/2- reduced K+ since was increasing level to 4.5- now daily  1/4: K+ stable. 15. Thrombocytopenia:   Platelets 148 on 12/27, continue to monitor  1/1- Plts 127 dwon from 145k  1/4: platelets stable at 128.  16. HTN/Peripheral edema: On Demadex daily   Labile on 12/28 17. Atrial fibrillation: Coumadin restarted 12/25 (see #2) 18. Seroma?- per NSU, will start Keflex- due to Renal impairment, will do 500 mg BID x 7 days and needs f/u with  Osterguard in 7 days. Will check labs in AM to f/u on WBC and renal fxn  1/1- WBC down to 11.4 from 12.1; Cr 1.04- improved from 1.25  1/3- labs q Monday 19. Constipation  1/1- ask for prns if no BM by tonight  1/3- LBM 1/2 in afternoon    LOS: 14 days A FACE TO FACE EVALUATION WAS PERFORMED  Jefte Carithers P Henryk Ursin 04/18/2019, 9:12 AM

## 2019-04-18 NOTE — Progress Notes (Signed)
Occupational Therapy Session Note  Patient Details  Name: Natasha Lawson MRN: [id number] Date of Birth: 08/17/1940  Today's Date: 04/18/2019 OT Individual Time: 0900-1000 OT Individual Time Calculation (min): 60 min    Short Term Goals: Week 2:  OT Short Term Goal 1 (Week 2): STG=LTG due to LOS  Skilled Therapeutic Interventions/Progress Updates:    OT intervention with focus on sit<>stand and BUE activities to increase independence with BADLs.  Pt attempted sit<>stand X 2 with max A.  Pt had just completed PT session and stated her "legs were worn out." Focus shifted to BUE [facility] tasks-stacking poker chips, removing beads from theraputty, and theraputty activities to increase BUE function and ability to complete BADLs. Pt with intention tremor in B hands.  Pt propelled w/c with BLE back to room and remained seated in w/c with all needs within reach.   Therapy Documentation Precautions:  Precautions Precautions: Cervical Required Braces or Orthoses: Cervical Brace Cervical Brace: For comfort, Soft collar Restrictions Weight Bearing Restrictions: No  Pain:  Pt denies pain   Therapy/Group: Individual Therapy  Leroy Libman 04/18/2019, 10:57 AM

## 2019-04-18 NOTE — Progress Notes (Signed)
Physical Therapy Session Note  Patient Details  Name: Natasha Lawson MRN: [id number] Date of Birth: 06-08-1940  Today's Date: 04/18/2019 PT Individual Time: 1300-1410 PT Individual Time Calculation (min): 70 min   Short Term Goals: Week 2:  PT Short Term Goal 1 (Week 2): STG = LTG due to estimated d/c date.  Skilled Therapeutic Interventions/Progress Updates:    Pt received seated in bed, agreeable to PT session. No complaints of pain. Pt's husband Natasha Lawson present at beginning of therapy session. Reviewed d/c date of 1/7 and d/c goals of Supervision to min A overall. Per pt and her husband they will have 24/7 assist upon d/c home with patient's sister being there during the day and her niece being available at night. Will review family education training with patient's husband this week and provide videos of how to assist pt in order to train family members that will be with her during the day. Also per pt they have installed a 2nd rail in the steps to enter her home. Pt is able to come from seated in bed to sitting EOB at Supervision level with use of bedrail. Pt is min to mod A for BLE management getting back into bed with use of bedrail as well. Per pt her bed is adjustable at home (head elevates). As pt currently relies on bedrails provided handout for where to purchase bedrail for improved independence with bed mobility. Sit to stand with CGA to min A to RW throughout session. Ambulation x 70 ft with RW at CGA level, decreased BLE coordination during gait due to ongoing proprioceptive deficits. Pt exhibits improved endurance for gait training this date. Standing alt L/R 4" step taps with RW and min A for balance for BLE strengthening and coordination training. Pt has incontinence of urine during session. Pt reports improvement in ability to tell if she needs to urinate but still has ongoing incontinence. Educated pt on timed toileting to decrease number of bladder accidents. Manual w/c propulsion x 150 ft  with use of BUE at Supervision level. Stand pivot transfer w/c to [facility] with min A and RW. Pt is able to continently void again once seated on commode. Pt is setup A for pericare, max A for brief change. Pt is able to doff her shirt and change into nightgown independently. Stand pivot transfer back to bed with RW and min A. Sit to supine min A for RLE management. Pt left semi-reclined in bed with needs in reach at end of session.  Therapy Documentation Precautions:  Precautions Precautions: Cervical Required Braces or Orthoses: Cervical Brace Cervical Brace: For comfort, Soft collar Restrictions Weight Bearing Restrictions: No    Therapy/Group: Individual Therapy   Excell Seltzer, PT, DPT  04/18/2019, 2:44 PM

## 2019-04-19 ENCOUNTER — Inpatient Hospital Stay (HOSPITAL_COMMUNITY): Payer: PRIVATE HEALTH INSURANCE | Admitting: Physical Therapy

## 2019-04-19 ENCOUNTER — Inpatient Hospital Stay (HOSPITAL_COMMUNITY): Payer: PRIVATE HEALTH INSURANCE

## 2019-04-19 LAB — AEROBIC/ANAEROBIC CULTURE W GRAM STAIN (SURGICAL/DEEP WOUND): Gram Stain: NONE SEEN

## 2019-04-19 LAB — BASIC METABOLIC PANEL
Anion gap: 9 (ref 5–15)
BUN: 52 mg/dL — ABNORMAL HIGH (ref 8–23)
CO2: 29 mmol/L (ref 22–32)
Calcium: 8.4 mg/dL — ABNORMAL LOW (ref 8.9–10.3)
Chloride: 105 mmol/L (ref 98–111)
Creatinine, Ser: 0.96 mg/dL (ref 0.44–1.00)
GFR calc Af Amer: >60 mL/min (ref >60)
GFR calc non Af Amer: 57 mL/min — ABNORMAL LOW (ref >60)
Glucose, Bld: 180 mg/dL — ABNORMAL HIGH (ref 70–99)
Potassium: 4.5 mmol/L (ref 3.5–5.1)
Sodium: 143 mmol/L (ref 135–145)

## 2019-04-19 LAB — GLUCOSE, CAPILLARY
Glucose-Capillary: 150 mg/dL — ABNORMAL HIGH (ref 70–99)
Glucose-Capillary: 145 mg/dL — ABNORMAL HIGH (ref 70–99)
Glucose-Capillary: 130 mg/dL — ABNORMAL HIGH (ref 70–99)
Glucose-Capillary: 151 mg/dL — ABNORMAL HIGH (ref 70–99)

## 2019-04-19 LAB — PROTIME-INR
INR: 3 — ABNORMAL HIGH (ref 0.8–1.2)
Prothrombin Time: 30.8 seconds — ABNORMAL HIGH (ref 11.4–15.2)

## 2019-04-19 MED ORDER — WARFARIN 0.5 MG HALF TABLET
0.5000 mg | ORAL_TABLET | Freq: Once | ORAL | Status: AC
  Administered 2019-04-19: 0.5 mg via ORAL
  Filled 2019-04-19 (×2): qty 1

## 2019-04-19 MED ORDER — DEXAMETHASONE 4 MG PO TABS
10.0000 mg | ORAL_TABLET | Freq: Two times a day (BID) | ORAL | Status: DC
  Administered 2019-04-19 – 2019-04-21 (×4): 10 mg via ORAL
  Filled 2019-04-19 (×4): qty 3

## 2019-04-19 NOTE — Progress Notes (Signed)
[place] PHYSICAL MEDICINE & REHABILITATION PROGRESS NOTE   Subjective/Complaints:  Pt reports feeling good- they changed her neck incision dressing at 7am- at 8:15 am, it already soaked/saturated with serous drainage.    ROS: +numbness improved today Denies CP, shortness of breath, nausea, vomiting, diarrhea.   Objective:   No results found. Recent Labs    04/18/19 0609  WBC 10.4  HGB 11.6*  HCT 35.8*  PLT 128*   Recent Labs    04/18/19 0609 04/19/19 0513  NA 142 143  K 4.2 4.5  CL 106 105  CO2 27 29  GLUCOSE 150* 180*  BUN 50* 52*  CREATININE 1.09* 0.96  CALCIUM 8.4* 8.4*    Intake/Output Summary (Last 24 hours) at 04/19/2019 0859 Last data filed at 04/19/2019 0720 Gross per 24 hour  Intake 820 ml  Output -  Net 820 ml     Physical Exam: Vital Signs Blood pressure (!) 156/62, pulse (!) 53, temperature 97.6 F (36.4 C), temperature source Oral, resp. rate 17, height 5\' 7"  (1.702 m), weight 111.1 kg, SpO2 100 %. Constitutional: No distress . Vital signs reviewed.  Morbidly obese. Sitting up in [place]. Talking to husband on Facetime.  HENT: Normocephalic.  Atraumatic. Eyes: EOMI. No discharge. Cardiovascular: irregular but regular rate Respiratory: Normal effort.  No stridor. CTA B/L GI: Non-distended. Skin: Incision - on neck- saturated dressing with serous drainage- amber color- was changed last ~ 1.25-1.5 hrs ago Psych: bright affect Musc: Lower extremity edema. Neuro: Alert Motor: Grossly 4-4+/5 throughout Sensation diminished to light touch in bilateral hands, ankles/feet  Assessment/Plan: 1. 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  Physiatrist is providing close team supervision and 24 hour management of active medical problems listed below.  Physiatrist and rehab team continue  to assess barriers to discharge/monitor patient progress toward functional and medical goals  Care Tool:  Bathing    Body parts bathed by patient: Right arm, Left arm, Abdomen, Chest, Right upper leg, Left upper leg, Face, Front perineal area, Buttocks, Right lower leg, Left lower leg   Body parts bathed by helper: Front perineal area, Buttocks, Right lower leg, Left lower leg     Bathing assist Assist Level: Contact Guard/Touching assist     Upper Body Dressing/Undressing Upper body dressing   What is the patient wearing?: Pull over shirt    Upper body assist Assist Level: Independent Assistive Device Comment: dressing stick  Lower Body Dressing/Undressing Lower body dressing      What is the patient wearing?: Underwear/pull up, Pants     Lower body assist Assist for lower body dressing: Supervision/Verbal cueing     Toileting Toileting    Toileting assist Assist for toileting: Contact Guard/Touching assist     Transfers Chair/bed transfer  Transfers assist     Chair/bed transfer assist level: Minimal Assistance - Patient > 75%     Locomotion Ambulation   Ambulation assist      Assist level: Minimal Assistance - Patient > 75% Assistive device: Walker-rolling Max distance: 70'   Walk 10 feet activity   Assist     Assist level: Minimal Assistance - Patient > 75% Assistive device: Walker-rolling   Walk 50 feet activity   Assist    Assist level: Minimal Assistance - Patient > 75% Assistive device: Walker-rolling    Walk 150 feet activity   Assist Walk 150 feet  activity did not occur: Safety/medical concerns         Walk 10 feet on uneven surface  activity   Assist Walk 10 feet on uneven surfaces activity did not occur: Safety/medical concerns         Wheelchair     Assist Will patient use wheelchair at discharge?: Yes Type of Wheelchair: Manual    Wheelchair assist level: Supervision/Verbal cueing Max wheelchair  distance: 150 ft    Wheelchair 50 feet with 2 turns activity    Assist        Assist Level: Supervision/Verbal cueing   Wheelchair 150 feet activity     Assist      Assist Level: Supervision/Verbal cueing   Blood pressure (!) 156/62, pulse (!) 53, temperature 97.6 F (36.4 C), temperature source Oral, resp. rate 17, height 5\' 7"  (1.702 m), weight 111.1 kg, SpO2 100 %.  Medical Problem List and Plan: 1.  Impaired mobility and ADLs secondary to cervical myelopathy            Continue CIR 2.  Antithrombotics: -DVT/anticoagulation:  Pharmaceutical: Cont Heparin               Restarted Coumadin home dose of 3mg  Mondays and Fridays on 12/25 with a goal of 2-3.   INR remained subtherapeutic on 12/27, labs pending for today  12/29- INR 2.0  12/30- will call NSU about concern for drainage since has been 15 days and because having increased numbness up to knees- waiting for response  1/4: INR 3.1  1/5- INR 3.0  -antiplatelet therapy: ASA 3. Pain Management:  Oxycodone prn  Flexeril changed to Skelaxin 800 mg TID prn for muscle spasms/tightness due to lethargy  Added Lyrica 25mg  for burning pain in right hand and right leg on 12/27 with improvement  12/29- pt refusing Lyrica- will stop for now. 4. Mood: LCSW to follow for evaluation and support.              -antipsychotic agents: NA 5. Neuropsych: This patient is capable of making decisions on her own behalf. 6. Skin/Wound Care: routine pressure relief measures.  7. Fluids/Electrolytes/Nutrition: Monitor I/O.  1/5- BUN 52- will recheck in AM and encourage intake.  8.  Subdural/intradural collection with cord compression: IV decadron 10 mg every 4 hours changed to p.o. every 6 hours on 12/22, decreased to 3 times daily on 12/28 9. Hyperglycemia: has h/o diet controlled DM in the past. Will monitor BS ac/hs as now on steroids. CBG (last 3)  Recent Labs    04/18/19 1653 04/18/19 2107 04/19/19 0631  GLUCAP 163* 189* 150*     1/2- better controlled overall- x1 >200- was 201  1/4: well controlled 10.  OSA: continue CPAP at nights.  12/22- pt refusing- said wants 2L O2 at night.   11. PAF: On Bystolic bid.  Monitor HR TID--continue to hold coumadin.  12. H/o iron deficiency anemia:  Hemoglobin 11.0 on 12/27, continue to monitor 13. H/o Anxiety disorder: Has been stable on Zoloft.  14. Hypokalemia: Kdur increased to 40 meq bid, DC'd on 12/25  Potassium 3.9 on 12/25, continue to monitor   12/29- K+ was 3.3 on 12/28- -was repleted 12/28  12/30- K+ 3.7'  1/2- reduced K+ since was increasing level to 4.5- now daily  1/4: K+ stable. 15. Thrombocytopenia:   Platelets 148 on 12/27, continue to monitor  1/1- Plts 127 down from 145k  1/4: platelets stable at 128.  16. HTN/Peripheral edema: On Demadex daily  Labile on 12/28 17. Atrial fibrillation: Coumadin restarted 12/25 (see #2) 18. Seroma?- per NSU, will start Keflex- due to Renal impairment, will do 500 mg BID x 7 days and needs f/u with Osterguard in 7 days. Will check labs in AM to f/u on WBC and renal fxn  1/1- WBC down to 11.4 from 12.1; Cr 1.04- improved from 1.25  1/3- labs q Monday  1/4- WBC 10.4k 19. Constipation  1/1- ask for prns if no BM by tonight  1/3- LBM 1/2 in afternoon    LOS: 15 days A FACE TO FACE EVALUATION WAS PERFORMED  Natasha Lawson 04/19/2019, 8:59 AM

## 2019-04-19 NOTE — Progress Notes (Signed)
[place] for warfarin Indication: atrial fibrillation (CHADS2VASc = 4)  Patient Measurements: Height: 5\' 7"  (170.2 cm) Weight: 244 lb 14.9 oz (111.1 kg) IBW/kg (Calculated) : 61.6  Vital Signs: Temp: 97.9 F (36.6 C) (01/05 1426) Temp Source: Oral (01/05 1426) BP: 138/70 (01/05 1426) Pulse Rate: 62 (01/05 1426)  Labs: Recent Labs    04/17/19 0644 04/18/19 0609 04/19/19 0513  HGB  --  11.6*  --   HCT  --  35.8*  --   PLT  --  128*  --   LABPROT 30.0* 32.1* 30.8*  INR 2.9* 3.1* 3.0*  CREATININE  --  1.09* 0.96    Estimated Creatinine Clearance: 64.1 mL/min (by C-G formula based on SCr of 0.96 mg/dL).  Assessment: 79 yo F with AFib on warfarin PTA admitted with cervical laminectomy. Per neurology, pharmacy can resume warfarin on POD#10 (12/25). Nursing spoke with patient and she states PTA she was taking Warfarin 2 mg PO daily. Med hx states 2mg  daily except 3mg  on Mon/Fri.  INR 3.0, no noted bleeding, serous drainage from neck incision. Eating 100%  Goal of Therapy:  INR 2-3 Monitor platelets by anticoagulation protocol: Yes   Plan:  Warfarin 0.5 mg po x 1 Daily PT / INR, Qmon CBC Planned discharge 1/7, pharmacy to suggest discharge dose   Benetta Spar, PharmD, BCPS, [facility] Clinical Pharmacist  Please check AMION for all [place] phone numbers After 10:00 PM, call [place] [phone]

## 2019-04-19 NOTE — Patient Care Conference (Signed)
Inpatient RehabilitationTeam Conference and Plan of Care Update Date: 04/19/2019   Time: 11:25 AM    Patient Name: Natasha Lawson      Medical Record Number: [id number]  Date of Birth: 1940-04-21 Sex: Female         Room/Bed: 4M11C/4M11C-01 Payor Info: Payor: MEDICARE / Plan: MEDICARE PART A AND B / Product Type: *No Product type* /    Admit Date/Time:  04/04/2019  3:32 PM  Primary Diagnosis:  Cervical arthritis with myelopathy  Patient Active Problem List   Diagnosis Date Noted  . Thrombocytopenia ([place])   . Iron deficiency anemia   . Postoperative pain   . Subtherapeutic international normalized ratio (INR)   . Hyperglycemia   . HTN (hypertension) 04/05/2019  . Quadriplegia ([place]) 04/05/2019  . Atrial fibrillation ([place]) 04/05/2019  . Anticoagulated on Coumadin 04/05/2019  . 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  . Cervical myelopathy ([place]) 03/29/2019    Expected Discharge Date: Expected Discharge Date: 04/21/19  Team Members Present: Physician leading conference: Dr. Courtney Heys Social Worker Present: Lennart Pall, LCSW Nurse Present: Mohammed Kindle, RN Case Manager: Karene Fry, RN PT Present: Excell Seltzer, PT OT Present: Willeen Cass, OT;Roanna Epley, COTA SLP Present: Stormy Fabian, SLP PPS Coordinator present : Gunnar Fusi, Novella Olive, PT     Current Status/Progress Goal Weekly Team Focus  Bowel/Bladder   Continent of bowel and bladder, LBM-04/18/19  Maintain continence  Assess and assist with toileting needs   Swallow/Nutrition/ Hydration             ADL's   sit<>stand-supervision/min A with RW, functional transfers-CGA/min A; bathing/dressing-CGA with AE, fatigues quickly  Supervision overall, min A shower transfers  ADL retraining, functional tranfsers, sit<>stand, standing balance, endurance, family educaiton   Mobility   Supervision to mod A bed mobility, CGA to min A sit to stand to RW, stand pivot transfer with RW CGA to min A, gait up to 61'  with CGA, mod A stairs with one handrail  Supervision to min A overall, short distance gait then w/c level goals  d/c planning, family education, BLE NMR   Communication             Safety/Cognition/ Behavioral Observations            Pain   Denies pain  Remain pain free  Assess pain every shift and as needed   Skin   Scattered bruising, surgical site posterior neck with minimal to moderate serasanguinous drainage, skin tear noted to right forearm, MASD to buttocks  Promote healing, no further skin impairment  Assess skin every shift, continue wound care orders as ordered.    Rehab Goals Patient on target to meet rehab goals: Yes *See Care Plan and progress notes for long and short-term goals.     Barriers to Discharge  Current Status/Progress Possible Resolutions Date Resolved   Nursing                  PT                    OT                  SLP                SW                Discharge Planning/Teaching Needs:  Home with spouse and daughter and other family members to provide 24/7 assistance.  Teaching to be planned  prior to d/c   Team Discussion: Drainage from neck incision, increased BUN, enc fluids, monitor labs.  RN cont B/B.  PT making progress, min/CGA 70', I W/C level, min A stairs.  OT fatigues in the pm, S transfers in morning, mod A transfers in the afternoon, numbness feet and hands.   Revisions to Treatment Plan: N/A     Medical Summary Current Status: continent- asks for tylenol prn; Weekly Focus/Goal: CGA for RW- 75 ft - mod I in W/C; CGA for transfers; OT- does fatigue- in AM- almost Supervision- can be mod I sit-stand when tired;  Barriers to Discharge: Behavior;Decreased family/caregiver support;Home enviroment access/layout;Weight;Wound care  Barriers to Discharge Comments: still c/o numbness in hands/feets- using hands functionally Possible Resolutions to Barriers: d/c 1/7- family traiing still needed   Continued Need for Acute Rehabilitation  Level of Care: The patient requires daily medical management by a physician with specialized training in physical medicine and rehabilitation for the following reasons: Direction of a multidisciplinary physical rehabilitation program to maximize functional independence : Yes Medical management of patient stability for increased activity during participation in an intensive rehabilitation regime.: Yes Analysis of laboratory values and/or radiology reports with any subsequent need for medication adjustment and/or medical intervention. : Yes   I attest that I was present, lead the team conference, and concur with the assessment and plan of the team.   Retta Diones 04/19/2019, 10:21 PM   Team conference was held via web/ teleconference due to [place] - 19

## 2019-04-19 NOTE — Progress Notes (Signed)
Physical Therapy Session Note  Patient Details  Name: Natasha Lawson MRN: [id number] Date of Birth: 10/19/1940  Today's Date: 04/19/2019 PT Individual Time: 0900-1000 PT Individual Time Calculation (min): 60 min   Short Term Goals: Week 2:  PT Short Term Goal 1 (Week 2): STG = LTG due to estimated d/c date.  Skilled Therapeutic Interventions/Progress Updates:    Pt received seated in w/c in room, agreeable to PT session. No complaints of pain. Pt reports urge to urinate. Stand pivot transfer w/c to [facility] with RW and CGA. Pt is Supervision for standing balance with RW while managing clothing and performing pericare. Pt does require assist to don brief and pants following toileting. Manual w/c propulsion x 150 ft with use of BUE at mod I level. Pt does require cueing for management of w/c parts. Ascend/descend 8 x 3" steps with 2 handrails and min A, step-to gait pattern. Pt exhibits improved endurance for stairs this date. Car transfer with CGA to RW, pt is able to manage BLE independently with use of UE to assist lifting legs in/out of car. Ambulation x 50 ft with RW and min A for balance with flexed trunk posture and decreased BLE coordination due to decreased proprioception. Pt requests to return to bed at end of session. Sit to supine Supervision with HOB elevated, use of R bedrail, and use of BUE to assist with LE management. Pt left semi-reclined in bed with needs in reach at end of session, bed alarm in place.  Therapy Documentation Precautions:  Precautions Precautions: Cervical Required Braces or Orthoses: Cervical Brace Cervical Brace: For comfort, Soft collar Restrictions Weight Bearing Restrictions: No    Therapy/Group: Individual Therapy   Excell Seltzer, PT, DPT  04/19/2019, 3:25 PM

## 2019-04-19 NOTE — Progress Notes (Signed)
Occupational Therapy Session Note  Patient Details  Name: Natasha Lawson MRN: [id number] Date of Birth: August 04, 1940  Today's Date: 04/19/2019 OT Individual Time: 1300-1410 OT Individual Time Calculation (min): 70 min    Short Term Goals: Week 2:  OT Short Term Goal 1 (Week 2): STG=LTG due to LOS  Skilled Therapeutic Interventions/Progress Updates:    OT intervention with focus on bed mobility, sit<>stand, standing balance, functional transfers, toileting, w/c mobility, bathing at shower level and dressing with sit<>stand from seat to increase independence with BADLs. Supine>sit EOB with supervision.  Stand pivot transfer with RW bed to [facility] with supervision.  Pt completed all toileting tasks with supervision.Pt propelled w/c to bathroom and transferred to shower bench for bathing.  Pt uses long handle sponges to assist with LB bathing tasks and to bathe buttocks with sit<>stand. Pt completed all bathing/dressing tasks with supervision. Pt uses dressing stick to assist with donning socks.  Continued with home safety education and energy conservation strategies. Pt returned to bed with supervision and repositioned in bed without assistance. Pt remained in bed with all needs within reach and bed alarm activated.   Therapy Documentation Precautions:  Precautions Precautions: Cervical Required Braces or Orthoses: Cervical Brace Cervical Brace: For comfort, Soft collar Restrictions Weight Bearing Restrictions: No  Pain:  Pt denies pain this afternoon   Therapy/Group: Individual Therapy  Leroy Libman 04/19/2019, 2:16 PM

## 2019-04-19 NOTE — Progress Notes (Signed)
F/u NSGY eval, wound looks like it is still healing well, no erythema/induration. Dressing was clean when I saw her but reportedly still having some serosang drainage. No change in plan of care, told her to use dressings as needed to prevent soiling linens. Follow up with me 2 weeks after discharge, but call with any concerns or questions prior to then.

## 2019-04-19 NOTE — Progress Notes (Signed)
Occupational Therapy Session Note  Patient Details  Name: Natasha Lawson MRN: [id number] Date of Birth: October 30, 1940  Today's Date: 04/19/2019 OT Individual Time: 0700-0800 OT Individual Time Calculation (min): 60 min    Short Term Goals: Week 2:  OT Short Term Goal 1 (Week 2): STG=LTG due to LOS  Skilled Therapeutic Interventions/Progress Updates:    Pt resting in w/c upon arrival. Pt declined shower until afternoon session.  Pt completed dressing tasks with sit<>stand from w/c with supervision.  Pt completed grooming tasks w/c level at sink without assistance.  OT intervention with focus on standing balance/endurance, activity tolerance, safety awareness, education, and w/c mobility to increase independence with BADLs and prepare for discharge 1/7. Sit<>stand with supervision this morning.  Continued activity tolerance and energy conservation education.  Discussed importance of scheduling tasks with appropriate breaks during day. Pt performed sit<>stand X 5 during session.  Pt required min A for last sit<>stand 2/2 fatigue. Pt remained in w/c with all needs within reach.   Therapy Documentation Precautions:  Precautions Precautions: Cervical Required Braces or Orthoses: Cervical Brace Cervical Brace: For comfort, Soft collar Restrictions Weight Bearing Restrictions: No Pain:  Pt denies pain this morning   Therapy/Group: Individual Therapy  Leroy Libman 04/19/2019, 8:06 AM

## 2019-04-20 ENCOUNTER — Ambulatory Visit (HOSPITAL_COMMUNITY): Payer: PRIVATE HEALTH INSURANCE | Admitting: Physical Therapy

## 2019-04-20 ENCOUNTER — Inpatient Hospital Stay (HOSPITAL_COMMUNITY): Payer: PRIVATE HEALTH INSURANCE

## 2019-04-20 ENCOUNTER — Encounter (HOSPITAL_COMMUNITY): Payer: PRIVATE HEALTH INSURANCE

## 2019-04-20 LAB — CBC WITH DIFFERENTIAL/PLATELET
Abs Immature Granulocytes: 0.17 10*3/uL — ABNORMAL HIGH (ref 0.00–0.07)
Basophils Absolute: 0 10*3/uL (ref 0.0–0.1)
Basophils Relative: 0 %
Eosinophils Absolute: 0 10*3/uL (ref 0.0–0.5)
Eosinophils Relative: 0 %
HCT: 33.9 % — ABNORMAL LOW (ref 36.0–46.0)
Hemoglobin: 11.1 g/dL — ABNORMAL LOW (ref 12.0–15.0)
Immature Granulocytes: 2 %
Lymphocytes Relative: 2 %
Lymphs Abs: 0.2 10*3/uL — ABNORMAL LOW (ref 0.7–4.0)
MCH: 33 pg (ref 26.0–34.0)
MCHC: 32.7 g/dL (ref 30.0–36.0)
MCV: 100.9 fL — ABNORMAL HIGH (ref 80.0–100.0)
Monocytes Absolute: 0.3 10*3/uL (ref 0.1–1.0)
Monocytes Relative: 3 %
Neutro Abs: 8.5 10*3/uL — ABNORMAL HIGH (ref 1.7–7.7)
Neutrophils Relative %: 93 %
Platelets: UNDETERMINED 10*3/uL (ref 150–400)
RBC: 3.36 MIL/uL — ABNORMAL LOW (ref 3.87–5.11)
RDW: 14.8 % (ref 11.5–15.5)
WBC: 9.2 10*3/uL (ref 4.0–10.5)
nRBC: 0 % (ref 0.0–0.2)

## 2019-04-20 LAB — BASIC METABOLIC PANEL
Anion gap: 9 (ref 5–15)
BUN: 42 mg/dL — ABNORMAL HIGH (ref 8–23)
CO2: 24 mmol/L (ref 22–32)
Calcium: 8 mg/dL — ABNORMAL LOW (ref 8.9–10.3)
Chloride: 105 mmol/L (ref 98–111)
Creatinine, Ser: 0.85 mg/dL (ref 0.44–1.00)
GFR calc Af Amer: >60 mL/min (ref >60)
GFR calc non Af Amer: >60 mL/min (ref >60)
Glucose, Bld: 165 mg/dL — ABNORMAL HIGH (ref 70–99)
Potassium: 4.9 mmol/L (ref 3.5–5.1)
Sodium: 138 mmol/L (ref 135–145)

## 2019-04-20 LAB — PROTIME-INR
INR: 2.6 — ABNORMAL HIGH (ref 0.8–1.2)
Prothrombin Time: 27.7 seconds — ABNORMAL HIGH (ref 11.4–15.2)

## 2019-04-20 LAB — GLUCOSE, CAPILLARY
Glucose-Capillary: 142 mg/dL — ABNORMAL HIGH (ref 70–99)
Glucose-Capillary: 127 mg/dL — ABNORMAL HIGH (ref 70–99)
Glucose-Capillary: 216 mg/dL — ABNORMAL HIGH (ref 70–99)
Glucose-Capillary: 155 mg/dL — ABNORMAL HIGH (ref 70–99)

## 2019-04-20 MED ORDER — WARFARIN SODIUM 2 MG PO TABS
2.0000 mg | ORAL_TABLET | Freq: Once | ORAL | Status: AC
  Administered 2019-04-20: 2 mg via ORAL
  Filled 2019-04-20: qty 1

## 2019-04-20 NOTE — Progress Notes (Signed)
Physical Therapy Session Note  Patient Details  Name: Natasha Lawson MRN: [id number] Date of Birth: 1940-11-22  Today's Date: 04/20/2019 PT Individual Time: 1000-1045 PT Individual Time Calculation (min): 45 min   Short Term Goals: Week 2:  PT Short Term Goal 1 (Week 2): STG = LTG due to estimated d/c date.  Skilled Therapeutic Interventions/Progress Updates:    Pt received seated in w/c in room with husband present for hands-on family education session. Pt is at mod I level for w/c mobility x 150 ft with use of BUE and is independent for management of w/c parts. Pt is CGA to min A to stand to RW throughout session. Pt is able to ascend/descend 8 x 3" stairs with 2 handrails and min A with step-to gait pattern. Demonstrated how to provided min A to patient and pt's husband was able to video assist in order to show family members. Car transfer with min A for LLE management in/out of car, CGA to stand from car seat height to RW. Performed demonstration of how to provide this level of assist to patient and pt's husband was able to video demonstration. Pt is able to perform rolling in bed and supine to sit at mod I level with HOB elevated and use of bedrail, requires min A for RLE management when going from sit to supine. Pt's husband demos good understanding of assist level that patient requires and necessary equipment. Pt left semi-reclined in bed with needs in reach at end of session.  Therapy Documentation Precautions:  Precautions Precautions: Cervical Precaution Comments: reviewed cervical precautions Required Braces or Orthoses: Cervical Brace Cervical Brace: For comfort, Soft collar Restrictions Weight Bearing Restrictions: No    Therapy/Group: Individual Therapy   Excell Seltzer, PT, DPT  04/20/2019, 3:49 PM

## 2019-04-20 NOTE — Progress Notes (Signed)
Occupational Therapy Session Note  Patient Details  Name: Natasha Lawson MRN: [id number] Date of Birth: 06-09-1940  70 min total tx time  Skilled Therapeutic Interventions/Progress Updates:    1:1. Pt received w/c agreeable to tx. Pt with no report of pain. Pt propels w/c to/from all tx destinaitons with MOD I over carpet, tile and laminate to simulate home environment. Pt propels w/c around gift shop for close quarters maneuvering to simulate moving around furniture. Pt completes functional mobiltiy to standard chair in atrium with close supervision and VC for keeping RW with her and taking time during turns. Pt retrieves items off floor with reacher seated in w/c propelling with BUE. Edu re use of bag behing w/c to keep needed items. Pt completes dowel HEP for global strengthening with 4# dowel rod seated in w/c for BUE strengthening required for transfer and BADLs. Exited session with pt seated in w/c, call light in reach and all needs met   Therapy Documentation Precautions:  Precautions Precautions: Cervical Precaution Comments: reviewed cervical precautions Required Braces or Orthoses: Cervical Brace Cervical Brace: For comfort, Soft collar Restrictions Weight Bearing Restrictions: No General:   Vital Signs: Therapy Vitals Temp: 98 F (36.7 C) Temp Source: Oral Pulse Rate: (!) 53 Resp: 18 BP: (!) 148/64 Patient Position (if appropriate): Lying Oxygen Therapy SpO2: 100 % O2 Device: Room Air Pain:   ADL:   Vision Baseline Vision/History: Cataracts;Wears glasses Wears Glasses: Reading only Patient Visual Report: No change from baseline Vision Assessment?: No apparent visual deficits Perception  Perception: Within Functional Limits Praxis Praxis: Intact Exercises:   Other Treatments:     Therapy/Group: Individual Therapy  Tonny Branch 04/20/2019, 3:07 PM

## 2019-04-20 NOTE — Progress Notes (Signed)
Occupational Therapy Session Note  Patient Details  Name: Natasha Lawson MRN: [id number] Date of Birth: [birth year]  Today's Date: 04/20/2019 OT Individual Time: 1100-1155 OT Individual Time Calculation (min): 55 min    Short Term Goals: Week 2:  OT Short Term Goal 1 (Week 2): STG=LTG due to LOS  Skilled Therapeutic Interventions/Progress Updates:    Pt resting in bed upon arrival with husband present.  OT intervention with focus on bed mobility, sit<>stand, standing balance, toilet transfers, toileting, and education in preparation for discharge home tomorrow.  Supervision for supine<>sit EOB using bed rails. Stand pivot transfers to [facility] with supervision.  Sit<>stand with supervision. Standing balance with supervision.  Recommendation that pt have close supervision when standing for BADLs and toileting in addition to stand pivot transfers. Discussed energy conservation and daily schedule strategies.  Pt and husband verbalized understanding of all recommendations. Pt returned to bed and remained in bed with all needs within reach, bed alarm activated, and husband present.  Therapy Documentation Precautions:  Precautions Precautions: Cervical Precaution Comments: reviewed cervical precautions Required Braces or Orthoses: Cervical Brace Cervical Brace: For comfort, Soft collar Restrictions Weight Bearing Restrictions: No  Pain: Pain Assessment Pain Scale: 0-10 Pain Score: 0-No pain  Therapy/Group: Individual Therapy  Leroy Libman 04/20/2019, 12:02 PM

## 2019-04-20 NOTE — Progress Notes (Signed)
[place] for warfarin Indication: atrial fibrillation (CHADS2VASc = 4)  Patient Measurements: Height: 5\' 7"  (170.2 cm) Weight: 244 lb 14.9 oz (111.1 kg) IBW/kg (Calculated) : 61.6  Vital Signs: Temp: 97.7 F (36.5 C) (01/06 0457) Temp Source: Oral (01/06 0457) BP: 140/73 (01/06 0457) Pulse Rate: 55 (01/06 0457)  Labs: Recent Labs    04/18/19 0609 04/19/19 0513 04/20/19 0535  HGB 11.6*  --  11.1*  HCT 35.8*  --  33.9*  PLT 128*  --  PLATELET CLUMPS NOTED ON SMEAR, UNABLE TO ESTIMATE  LABPROT 32.1* 30.8* 27.7*  INR 3.1* 3.0* 2.6*  CREATININE 1.09* 0.96 0.85    Estimated Creatinine Clearance: 72.4 mL/min (by C-G formula based on SCr of 0.85 mg/dL).  Assessment: 79 yo F with AFib on warfarin PTA admitted with cervical laminectomy. Per neurology, pharmacy can resume warfarin on POD#10 (12/25). Nursing spoke with patient and she states PTA she was taking Warfarin 2 mg PO daily. Med hx states 2mg  daily except 3mg  on Mon/Fri.  INR 2.6, no noted bleeding, serous drainage from neck incision. Eating 100%.   Goal of Therapy:  INR 2-3 Monitor platelets by anticoagulation protocol: Yes   Plan:  Warfarin 2 mg po x 1 Daily PT / INR, Qmon CBC Planned discharge 1/7, consider discharging with 2mg  MWF and 1mg  TuThSaSun  And INR follow up next week   Benetta Spar, PharmD, BCPS, [place] Pharmacist  Please check AMION for all [place] phone numbers After 10:00 PM, call [place]

## 2019-04-20 NOTE — Plan of Care (Signed)
  Problem: Consults Goal: RH SPINAL CORD INJURY PATIENT EDUCATION Description:  See Patient Education module for education specifics.  Outcome: Progressing Goal: Skin Care Protocol Initiated - if Braden Score 18 or less Description: If consults are not indicated, leave blank or document N/A Outcome: Progressing   Problem: SCI BOWEL ELIMINATION Goal: RH STG MANAGE BOWEL WITH ASSISTANCE Description: STG Manage Bowel with mod I Assistance. Outcome: Progressing   Problem: SCI BLADDER ELIMINATION Goal: RH STG MANAGE BLADDER WITH ASSISTANCE Description: STG Manage Bladder With mod I Assistance Outcome: Progressing   Problem: RH SAFETY Goal: RH STG ADHERE TO SAFETY PRECAUTIONS W/ASSISTANCE/DEVICE Description: STG Adhere to Safety Precautions With mod I Assistance/Device. Outcome: Progressing Goal: RH STG DECREASED RISK OF FALL WITH ASSISTANCE Description: STG Decreased Risk of Fall With mod I Assistance. Outcome: Progressing   Problem: RH SKIN INTEGRITY Goal: RH STG SKIN FREE OF INFECTION/BREAKDOWN Description: Patients skin will remain free from further infection or breakdown with mod I assist. Outcome: Progressing Goal: RH STG MAINTAIN SKIN INTEGRITY WITH ASSISTANCE Description: STG Maintain Skin Integrity With mod I Assistance. Outcome: Progressing   Problem: RH PAIN MANAGEMENT Goal: RH STG PAIN MANAGED AT OR BELOW PT'S PAIN GOAL Description: < 4 Outcome: Progressing

## 2019-04-20 NOTE — Progress Notes (Signed)
Physical Therapy Discharge Summary  Patient Details  Name: Natasha Lawson MRN: [id number] Date of Birth: 1940-10-08  Today's Date: 04/20/2019   Patient has met 8 of 9 long term goals due to improved activity tolerance, improved balance, improved postural control, increased strength and ability to compensate for deficits.  Patient to discharge at a wheelchair level [place].   Patient's care partner is independent to provide the necessary physical assistance at discharge. Patient's husband has completed family education and is safe to train other family members to assist pt upon d/c home.  Reasons goals not met: Pt did not meet bed mobility goal as she still requires min A for safe bed mobility. Pt's family is able to provide this level of assist.  Recommendation:  Patient will benefit from ongoing skilled PT services in home health setting to continue to advance safe functional mobility, address ongoing impairments in endurance, strength, balance, safety, independence with functional mobility, and minimize fall risk.  Equipment: 20x18 manual w/c  Reasons for discharge: treatment goals met and discharge from hospital  Patient/family agrees with progress made and goals achieved: Yes  PT Discharge Precautions/Restrictions Precautions Precautions: Cervical Precaution Comments: reviewed cervical precautions Required Braces or Orthoses: Cervical Brace Cervical Brace: For comfort;Soft collar Restrictions Weight Bearing Restrictions: No Pain Pain Assessment Pain Scale: 0-10 Pain Score: 0-No pain Vision/Perception  Perception Perception: Within Functional Limits Praxis Praxis: Intact  Cognition Overall Cognitive Status: Within Functional Limits for tasks assessed Arousal/Alertness: Awake/alert Orientation Level: Oriented X4 Attention: Focused Focused Attention: Appears intact Memory: Appears intact Awareness: Appears intact Problem Solving: Appears intact Safety/Judgment: Appears  intact Sensation Sensation Light Touch: Impaired Detail Light Touch Impaired Details: Impaired RLE;Impaired LLE Proprioception: Impaired Detail Proprioception Impaired Details: Absent RLE;Absent LLE Coordination Gross Motor Movements are Fluid and Coordinated: No Fine Motor Movements are Fluid and Coordinated: Yes Coordination and Movement Description: impaired 2/2 dec proprioception BLE Motor  Motor Motor: Other (comment) Motor - Skilled Clinical Observations: Generalized weakness and deconditioning Motor - Discharge Observations: ongoing generalized weakness, improved since eval  Mobility Bed Mobility Bed Mobility: Rolling Right;Rolling Left;Supine to Sit;Sit to Supine Rolling Right: Independent with assistive device Rolling Left: Independent with assistive device Supine to Sit: Independent with assistive device Sit to Supine: Minimal Assistance - Patient > 75% Transfers Transfers: Sit to Stand;Stand Pivot Transfers Sit to Stand: Minimal Assistance - Patient > 75% Stand Pivot Transfers: Minimal Assistance - Patient > 75% Stand Pivot Transfer Details: Manual facilitation for weight shifting Transfer (Assistive device): Rolling walker Locomotion  Gait Ambulation: Yes Gait Assistance: Minimal Assistance - Patient > 75% Gait Distance (Feet): 70 Feet Assistive device: Rolling walker Gait Assistance Details: Verbal cues for precautions/safety Gait Gait: Yes Gait Pattern: Impaired Gait Pattern: Decreased step length - right;Decreased step length - left;Decreased hip/knee flexion - right;Decreased hip/knee flexion - left;Decreased dorsiflexion - right;Decreased dorsiflexion - left;Shuffle;Ataxic;Narrow base of support Gait velocity: decreased Stairs / Additional Locomotion Stairs: Yes Stairs Assistance: Minimal Assistance - Patient > 75% Stair Management Technique: Two rails;Step to pattern Number of Stairs: 8 Height of Stairs: 3 Wheelchair Mobility Wheelchair Mobility:  Yes Wheelchair Assistance: Independent with Camera operator: Both upper extremities Wheelchair Parts Management: Independent Distance: 150  Trunk/Postural Assessment  Cervical Assessment Cervical Assessment: Exceptions to WFL(cervical precautions) Thoracic Assessment Thoracic Assessment: Exceptions to WFL(kyphotic; rounded shoulders) Lumbar Assessment Lumbar Assessment: Exceptions to WFL(posterior pelvic tilt) Postural Control Postural Control: Deficits on evaluation  Balance Balance Balance Assessed: Yes Static Sitting Balance Static Sitting - Balance Support: Feet supported;No  upper extremity supported Static Sitting - Level of Assistance: 6: Modified independent (Device/Increase time) Dynamic Sitting Balance Dynamic Sitting - Balance Support: Feet supported;During functional activity;No upper extremity supported Dynamic Sitting - Level of Assistance: 6: Modified independent (Device/Increase time) Static Standing Balance Static Standing - Balance Support: Bilateral upper extremity supported;During functional activity Static Standing - Level of Assistance: 5: Stand by assistance Dynamic Standing Balance Dynamic Standing - Balance Support: Bilateral upper extremity supported;During functional activity Dynamic Standing - Level of Assistance: 4: Min assist Extremity Assessment   RLE Assessment RLE Assessment: Exceptions to [facility] General Strength Comments: impaired, see below RLE Strength Right Hip Flexion: 4/5 Right Knee Flexion: 4+/5 Right Knee Extension: 4+/5 Right Ankle Dorsiflexion: 4/5 LLE Assessment LLE Assessment: Exceptions to [facility] General Strength Comments: impaired, see below LLE Strength Left Hip Flexion: 4/5 Left Knee Flexion: 4/5 Left Knee Extension: 4+/5 Left Ankle Dorsiflexion: 4/5     Excell Seltzer, PT, DPT 04/20/2019, 10:54 AM

## 2019-04-20 NOTE — Progress Notes (Signed)
[facility] Discharge Summary Patient Details  Name: Natasha Lawson MRN: [id number] Date of Birth: Nov 21, 1940 Today's Date: 04/20/2019  Long term goals set: 1  Long term goals met: 1  Comments on progress toward goals: Pt participated in TR sessions during this admission focused on activity tolerance, energy conservation, activity analysis identifying modifications and stress management/relaxation training. Pt is able to identify and use strategies to address the above with Mod I.  Reasons for discharge: discharge from hospital Patient/family agrees with progress made and goals achieved: Yes  Laketha Leopard 04/20/2019, 8:16 AM

## 2019-04-20 NOTE — Progress Notes (Signed)
[facility] PHYSICAL MEDICINE & REHABILITATION PROGRESS NOTE   Subjective/Complaints:  Pt reports having BMs- "forced a little".  Incision still draining- but has dressing for it.    ROS: +numbness improved today Denies CP, shortness of breath, nausea, vomiting, diarrhea.   Objective:   No results found. Recent Labs    04/18/19 0609  WBC 10.4  HGB 11.6*  HCT 35.8*  PLT 128*   Recent Labs    04/19/19 0513 04/20/19 0535  NA 143 138  K 4.5 4.9  CL 105 105  CO2 29 24  GLUCOSE 180* 165*  BUN 52* 42*  CREATININE 0.96 0.85  CALCIUM 8.4* 8.0*    Intake/Output Summary (Last 24 hours) at 04/20/2019 0844 Last data filed at 04/20/2019 0730 Gross per 24 hour  Intake 720 ml  Output --  Net 720 ml     Physical Exam: Vital Signs Blood pressure 140/73, pulse (!) 55, temperature 97.7 F (36.5 C), temperature source Oral, resp. rate 17, height 5\' 7"  (1.702 m), weight 111.1 kg, SpO2 100 %. Constitutional: No distress . Vital signs reviewed.  Morbidly obese. Sitting up in bed; playing game on ipad and listening to music, NAD HENT: Normocephalic.  Atraumatic. Eyes: EOMI. No discharge. Cardiovascular: irregular but regular rate Respiratory: Normal effort.  No stridor. CTA B/L GI: Non-distended. Skin: Incision - on neck- C/D/I Psych: bright affect Musc: Lower extremity edema. Neuro: Alert Motor: Grossly 4-4+/5 throughout Sensation diminished to light touch in bilateral hands, ankles/feet  Assessment/Plan: 1. 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  Physiatrist is providing close team supervision and 24 hour management of active medical problems listed below.  Physiatrist and rehab team continue to assess barriers to discharge/monitor patient progress toward functional and medical goals  Care  Tool:  Bathing    Body parts bathed by patient: Right arm, Left arm, Abdomen, Chest, Right upper leg, Left upper leg, Face, Front perineal area, Buttocks, Right lower leg, Left lower leg   Body parts bathed by helper: Front perineal area, Buttocks, Right lower leg, Left lower leg     Bathing assist Assist Level: Supervision/Verbal cueing     Upper Body Dressing/Undressing Upper body dressing   What is the patient wearing?: Pull over shirt    Upper body assist Assist Level: Supervision/Verbal cueing Assistive Device Comment: dressing stick  Lower Body Dressing/Undressing Lower body dressing      What is the patient wearing?: Underwear/pull up, Pants     Lower body assist Assist for lower body dressing: Supervision/Verbal cueing     Toileting Toileting    Toileting assist Assist for toileting: Supervision/Verbal cueing     Transfers Chair/bed transfer  Transfers assist     Chair/bed transfer assist level: Minimal Assistance - Patient > 75%     Locomotion Ambulation   Ambulation assist      Assist level: Minimal Assistance - Patient > 75% Assistive device: Walker-rolling Max distance: 50'   Walk 10 feet activity   Assist     Assist level: Minimal Assistance - Patient > 75% Assistive device: Walker-rolling   Walk 50 feet activity   Assist    Assist level: Minimal Assistance - Patient > 75% Assistive device: Walker-rolling    Walk 150 feet activity   Assist Walk 150 feet activity did not occur: Safety/medical concerns         Walk 10 feet  on uneven surface  activity   Assist Walk 10 feet on uneven surfaces activity did not occur: Safety/medical concerns         Wheelchair     Assist Will patient use wheelchair at discharge?: Yes Type of Wheelchair: Manual    Wheelchair assist level: Independent Max wheelchair distance: 150 ft    Wheelchair 50 feet with 2 turns activity    Assist        Assist Level:  Independent   Wheelchair 150 feet activity     Assist      Assist Level: Independent   Blood pressure 140/73, pulse (!) 55, temperature 97.7 F (36.5 C), temperature source Oral, resp. rate 17, height 5\' 7"  (1.702 m), weight 111.1 kg, SpO2 100 %.  Medical Problem List and Plan: 1.  Impaired mobility and ADLs secondary to cervical myelopathy            Continue CIR 2.  Antithrombotics: -DVT/anticoagulation:  Pharmaceutical: Cont Heparin               Restarted Coumadin home dose of 3mg  Mondays and Fridays on 12/25 with a goal of 2-3.   INR remained subtherapeutic on 12/27, labs pending for today  12/29- INR 2.0  12/30- will call [facility] about concern for drainage since has been 15 days and because having increased numbness up to knees- waiting for response  1/4: INR 3.1  1/5- INR 3.0  1/6- INR 2.6  -antiplatelet therapy: ASA 3. Pain Management:  Oxycodone prn  Flexeril changed to Skelaxin 800 mg TID prn for muscle spasms/tightness due to lethargy  Added Lyrica 25mg  for burning pain in right hand and right leg on 12/27 with improvement  12/29- pt refusing Lyrica- will stop for now. 4. Mood: LCSW to follow for evaluation and support.              -antipsychotic agents: NA 5. Neuropsych: This patient is capable of making decisions on her own behalf. 6. Skin/Wound Care: routine pressure relief measures.  7. Fluids/Electrolytes/Nutrition: Monitor I/O.  1/5- BUN 52- will recheck in AM and encourage intake.  8.  Subdural/intradural collection with cord compression: IV decadron 10 mg every 4 hours changed to p.o. every 6 hours on 12/22, decreased to 3 times daily on 12/28 9. Hyperglycemia: has h/o diet controlled DM in the past. Will monitor BS ac/hs as now on steroids. CBG (last 3)  Recent Labs    04/19/19 1621 04/19/19 2125 04/20/19 0625  GLUCAP 130* 151* 142*    1/2- better controlled overall- x1 >200- was 201  1/6: well controlled- con't regimen 10.  OSA: continue CPAP at  nights.  12/22- pt refusing- said wants 2L O2 at night.   11. PAF: On Bystolic bid.  Monitor HR TID--continue to hold coumadin.  12. H/o iron deficiency anemia:  Hemoglobin 11.0 on 12/27, continue to monitor 13. H/o Anxiety disorder: Has been stable on Zoloft.  14. Hypokalemia: Kdur increased to 40 meq bid, DC'd on 12/25  Potassium 3.9 on 12/25, continue to monitor   12/29- K+ was 3.3 on 12/28- -was repleted 12/28  12/30- K+ 3.7'  1/2- reduced K+ since was increasing level to 4.5- now daily  1/4: K+ stable. 15. Thrombocytopenia:   Platelets 148 on 12/27, continue to monitor  1/1- Plts 127 down from 145k  1/4: platelets stable at 128.  16. HTN/Peripheral edema: On Demadex daily   Labile on 12/28 17. Atrial fibrillation: Coumadin restarted 12/25 (see #2) 18. Seroma?- per  [facility], will start Keflex- due to Renal impairment, will do 500 mg BID x 7 days and needs f/u with Osterguard in 7 days. Will check labs in AM to f/u on WBC and renal fxn  1/1- WBC down to 11.4 from 12.1; Cr 1.04- improved from 1.25  1/3- labs q Monday  1/4- WBC 10.4k 19. Constipation  1/1- ask for prns if no BM by tonight  1/3- LBM 1/2 in afternoon  1/6- having BMs per pt 20. Elevated BUN  1/6- BUN down to 42    LOS: 16 days A FACE TO FACE EVALUATION WAS PERFORMED  Natasha Lawson 04/20/2019, 8:44 AM

## 2019-04-21 LAB — PROTIME-INR
INR: 2.2 — ABNORMAL HIGH (ref 0.8–1.2)
Prothrombin Time: 24.2 seconds — ABNORMAL HIGH (ref 11.4–15.2)

## 2019-04-21 LAB — GLUCOSE, CAPILLARY: Glucose-Capillary: 136 mg/dL — ABNORMAL HIGH (ref 70–99)

## 2019-04-21 MED ORDER — WARFARIN SODIUM 2 MG PO TABS: 2.0000 mg | ORAL_TABLET | ORAL | Status: DC

## 2019-04-21 MED ORDER — BYSTOLIC 20 MG PO TABS: 10.0000 mg | ORAL_TABLET | Freq: Every day | ORAL | 0 refills | Status: DC

## 2019-04-21 MED ORDER — POLYSACCHARIDE IRON COMPLEX 150 MG PO CAPS: 150.0000 mg | ORAL_CAPSULE | Freq: Two times a day (BID) | ORAL | 0 refills | Status: AC

## 2019-04-21 MED ORDER — SENNOSIDES-DOCUSATE SODIUM 8.6-50 MG PO TABS: 1.0000 | ORAL_TABLET | Freq: Every day | ORAL | 0 refills | Status: DC

## 2019-04-21 MED ORDER — ACETAMINOPHEN 325 MG PO TABS: 325.0000 mg | ORAL_TABLET | ORAL | Status: DC | PRN

## 2019-04-21 MED ORDER — DEXAMETHASONE 2 MG PO TABS: 10.0000 mg | ORAL_TABLET | Freq: Two times a day (BID) | ORAL | 0 refills | Status: DC

## 2019-04-21 NOTE — Progress Notes (Signed)
[place] [place] PHYSICAL MEDICINE & REHABILITATION PROGRESS NOTE   Subjective/Complaints:  Pt reports ready for d/c.   Denies issues.   ROS: +numbness improved today Denies CP, shortness of breath, nausea, vomiting, diarrhea.   Objective:   No results found. Recent Labs    04/20/19 0535  WBC 9.2  HGB 11.1*  HCT 33.9*  PLT PLATELET CLUMPS NOTED ON SMEAR, UNABLE TO ESTIMATE   Recent Labs    04/19/19 0513 04/20/19 0535  NA 143 138  K 4.5 4.9  CL 105 105  CO2 29 24  GLUCOSE 180* 165*  BUN 52* 42*  CREATININE 0.96 0.85  CALCIUM 8.4* 8.0*    Intake/Output Summary (Last 24 hours) at 04/21/2019 0855 Last data filed at 04/20/2019 1841 Gross per 24 hour  Intake 360 ml  Output --  Net 360 ml     Physical Exam: Vital Signs Blood pressure 129/62, pulse (!) 56, temperature 97.6 F (36.4 C), temperature source Oral, resp. rate 18, height 5\' 7"  (1.702 m), weight 111.1 kg, SpO2 99 %. Constitutional: No distress . Vital signs reviewed.  Morbidly obese. Sitting up in bed; talking to husband on phoneNAD HENT: Normocephalic.  Atraumatic. Eyes: EOMI. No discharge. Cardiovascular: irregular but regular rate Respiratory: Normal effort.  No stridor. CTA B/L GI: Non-distended. Skin: Incision - on neck- C/D/I Psych: bright affect Musc: Lower extremity edema. Neuro: Alert Motor: Grossly 4-4+/5 throughout Sensation diminished to light touch in bilateral hands, ankles/feet  Assessment/Plan: 1. 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  Physiatrist is providing close team supervision and 24 hour management of active medical problems listed below.  Physiatrist and rehab team continue to assess barriers to discharge/monitor patient progress toward functional and medical goals  Care Tool:  Bathing    Body parts  bathed by patient: Right arm, Left arm, Abdomen, Chest, Right upper leg, Left upper leg, Face, Front perineal area, Buttocks, Right lower leg, Left lower leg   Body parts bathed by helper: Front perineal area, Buttocks, Right lower leg, Left lower leg     Bathing assist Assist Level: Supervision/Verbal cueing     Upper Body Dressing/Undressing Upper body dressing   What is the patient wearing?: Pull over shirt    Upper body assist Assist Level: Independent Assistive Device Comment: dressing stick  Lower Body Dressing/Undressing Lower body dressing      What is the patient wearing?: Underwear/pull up, Pants     Lower body assist Assist for lower body dressing: Supervision/Verbal cueing     Toileting Toileting    Toileting assist Assist for toileting: Supervision/Verbal cueing     Transfers Chair/bed transfer  Transfers assist     Chair/bed transfer assist level: Minimal Assistance - Patient > 75%     Locomotion Ambulation   Ambulation assist      Assist level: Minimal Assistance - Patient > 75% Assistive device: Walker-rolling Max distance: 70'   Walk 10 feet activity   Assist     Assist level: Minimal Assistance - Patient > 75% Assistive device: Walker-rolling   Walk 50 feet activity   Assist    Assist level: Minimal Assistance - Patient > 75% Assistive device: Walker-rolling    Walk 150 feet activity   Assist Walk 150 feet activity did not occur: Safety/medical concerns         Walk 10 feet on uneven surface  activity  Assist Walk 10 feet on uneven surfaces activity did not occur: Safety/medical concerns         Wheelchair     Assist Will patient use wheelchair at discharge?: Yes Type of Wheelchair: Manual    Wheelchair assist level: Independent Max wheelchair distance: 150 ft    Wheelchair 50 feet with 2 turns activity    Assist        Assist Level: Independent   Wheelchair 150 feet activity      Assist      Assist Level: Independent   Blood pressure 129/62, pulse (!) 56, temperature 97.6 F (36.4 C), temperature source Oral, resp. rate 18, height 5\' 7"  (1.702 m), weight 111.1 kg, SpO2 99 %.  Medical Problem List and Plan: 1.  Impaired mobility and ADLs secondary to cervical myelopathy            Continue CIR 2.  Antithrombotics: -DVT/anticoagulation:  Pharmaceutical: Cont Heparin               Restarted Coumadin home dose of 3mg  Mondays and Fridays on 12/25 with a goal of 2-3.   INR remained subtherapeutic on 12/27, labs pending for today  12/29- INR 2.0  12/30- will call [facility] about concern for drainage since has been 15 days and because having increased numbness up to knees- waiting for response  1/4: INR 3.1  1/5- INR 3.0  1/6- INR 2.6  -antiplatelet therapy: ASA 3. Pain Management:  Oxycodone prn  Flexeril changed to Skelaxin 800 mg TID prn for muscle spasms/tightness due to lethargy  Added Lyrica 25mg  for burning pain in right hand and right leg on 12/27 with improvement  12/29- pt refusing Lyrica- will stop for now. 4. Mood: LCSW to follow for evaluation and support.              -antipsychotic agents: NA 5. Neuropsych: This patient is capable of making decisions on her own behalf. 6. Skin/Wound Care: routine pressure relief measures.  7. Fluids/Electrolytes/Nutrition: Monitor I/O.  1/5- BUN 52- will recheck in AM and encourage intake.  8.  Subdural/intradural collection with cord compression: IV decadron 10 mg every 4 hours changed to p.o. every 6 hours on 12/22, decreased to 3 times daily on 12/28 9. Hyperglycemia: has h/o diet controlled DM in the past. Will monitor BS ac/hs as now on steroids. CBG (last 3)  Recent Labs    04/20/19 1643 04/20/19 2101 04/21/19 0622  GLUCAP 216* 155* 136*    1/2- better controlled overall- x1 >200- was 201  1/6: well controlled- con't regimen 10.  OSA: continue CPAP at nights.  12/22- pt refusing- said wants 2L O2 at  night.   11. PAF: On Bystolic bid.  Monitor HR TID--continue to hold coumadin.  12. H/o iron deficiency anemia:  Hemoglobin 11.0 on 12/27, continue to monitor 13. H/o Anxiety disorder: Has been stable on Zoloft.  14. Hypokalemia: Kdur increased to 40 meq bid, DC'd on 12/25  Potassium 3.9 on 12/25, continue to monitor   12/29- K+ was 3.3 on 12/28- -was repleted 12/28  12/30- K+ 3.7'  1/2- reduced K+ since was increasing level to 4.5- now daily  1/4: K+ stable. 15. Thrombocytopenia:   Platelets 148 on 12/27, continue to monitor  1/1- Plts 127 down from 145k  1/4: platelets stable at 128.  16. HTN/Peripheral edema: On Demadex daily   Labile on 12/28 17. Atrial fibrillation: Coumadin restarted 12/25 (see #2) 18. Seroma?- per [facility], will start Keflex- due to Renal  impairment, will do 500 mg BID x 7 days and needs f/u with Osterguard in 7 days. Will check labs in AM to f/u on WBC and renal fxn  1/1- WBC down to 11.4 from 12.1; Cr 1.04- improved from 1.25  1/3- labs q Monday  1/4- WBC 10.4k 19. Constipation  1/1- ask for prns if no BM by tonight  1/3- LBM 1/2 in afternoon  1/6- having BMs per pt 20. Elevated BUN  1/6- BUN down to 42    LOS: 17 days A FACE TO FACE EVALUATION WAS PERFORMED  Natasha Lawson 04/21/2019, 8:55 AM

## 2019-04-21 NOTE — Discharge Instructions (Signed)
Inpatient Rehab Discharge Instructions  Deshundra Formby Discharge date and time: 04/21/19   Activities/Precautions/ Functional Status: Activity: no lifting, driving, or strenuous exercise till cleared by MD Diet: regular diet Low salt diet. Drink plenty of fluids to avoid dehydration.  Wound Care:  Wash with soap and water. Pat dry and cover with dry dressing. Change dressing twice a day. Contact Dr. Venetia Constable if you develop any problems with your incision/wound--redness, swelling, increase in pain, drainage or if you develop fever or chills.   Functional status:  ___ No restrictions     ___ Walk up steps independently _X__ 24/7 supervision/assistance   ___ Walk up steps with assistance ___ Intermittent supervision/assistance  ___ Bathe/dress independently ___ Walk with walker     ___ Bathe/dress with assistance ___ Walk Independently    ___ Shower independently ___ Walk with assistance    _X__ Shower with assistance _X__ No alcohol     ___ Return to work/school ________   Special Instructions: 1. Coumadin check on Jan 12th at 10:15 am.   .COMMUNITY REFERRALS UPON DISCHARGE:    Home Health:   PT     OT                       Agency:  [facility] Phone: [phone]   Medical Equipment/Items Ordered:  Wheelchair, cushion                                                      Agency/Supplier:  [facility] @ [phone]    My questions have been answered and I understand these instructions. I will adhere to these goals and the provided educational materials after my discharge from the hospital.  Patient/Caregiver Signature _______________________________ Date __________  Clinician Signature _______________________________________ Date __________  Please bring this form and your medication list with you to all your follow-up doctor's appointments.

## 2019-04-21 NOTE — Plan of Care (Signed)
  Problem: Consults Goal: RH SPINAL CORD INJURY PATIENT EDUCATION Description:  See Patient Education module for education specifics.  Outcome: Completed/Met Goal: Skin Care Protocol Initiated - if Braden Score 18 or less Description: If consults are not indicated, leave blank or document N/A Outcome: Completed/Met   Problem: SCI BOWEL ELIMINATION Goal: RH STG MANAGE BOWEL WITH ASSISTANCE Description: STG Manage Bowel with mod I Assistance. Outcome: Completed/Met   Problem: SCI BLADDER ELIMINATION Goal: RH STG MANAGE BLADDER WITH ASSISTANCE Description: STG Manage Bladder With mod I Assistance Outcome: Completed/Met   Problem: RH SAFETY Goal: RH STG ADHERE TO SAFETY PRECAUTIONS W/ASSISTANCE/DEVICE Description: STG Adhere to Safety Precautions With mod I Assistance/Device. Outcome: Completed/Met Goal: RH STG DECREASED RISK OF FALL WITH ASSISTANCE Description: STG Decreased Risk of Fall With mod I Assistance. Outcome: Completed/Met   Problem: RH SKIN INTEGRITY Goal: RH STG SKIN FREE OF INFECTION/BREAKDOWN Description: Patients skin will remain free from further infection or breakdown with mod I assist. Outcome: Completed/Met Goal: RH STG MAINTAIN SKIN INTEGRITY WITH ASSISTANCE Description: STG Maintain Skin Integrity With mod I Assistance. Outcome: Completed/Met   Problem: RH PAIN MANAGEMENT Goal: RH STG PAIN MANAGED AT OR BELOW PT'S PAIN GOAL Description: < 4 Outcome: Completed/Met

## 2019-04-21 NOTE — Discharge Summary (Signed)
Physician Discharge Summary  Patient ID: Natasha Lawson MRN: [id number] DOB/AGE: 1940-09-08 79 y.o.  Admit date: 04/04/2019 Discharge date: 04/21/2019  Discharge Diagnoses:  Principal Problem:   Cervical arthritis with myelopathy Active Problems:   HTN (hypertension)   Quadriplegia (HCC)   Atrial fibrillation (HCC)   Anticoagulated on Coumadin   Thrombocytopenia (HCC)   Iron deficiency anemia   Postoperative pain   Steroid-induced hyperglycemia   Discharged Condition: stable   Significant Diagnostic Studies: N/A   Labs:  Basic Metabolic Panel: Recent Labs  Lab 04/15/19 0526 04/18/19 0609 04/19/19 0513 04/20/19 0535  NA 139 142 143 138  K 4.5 4.2 4.5 4.9  CL 102 106 105 105  CO2 27 27 29 24   GLUCOSE 137* 150* 180* 165*  BUN 42* 50* 52* 42*  CREATININE 1.04* 1.09* 0.96 0.85  CALCIUM 8.5* 8.4* 8.4* 8.0*    CBC: Recent Labs  Lab 04/15/19 0526 04/18/19 0609 04/20/19 0535  WBC 11.4* 10.4 9.2  NEUTROABS 10.1*  --  8.5*  HGB 11.9* 11.6* 11.1*  HCT 35.9* 35.8* 33.9*  MCV 99.2 102.0* 100.9*  PLT 127* 128* PLATELET CLUMPS NOTED ON SMEAR, UNABLE TO ESTIMATE    CBG: Recent Labs  Lab 04/20/19 0625 04/20/19 1139 04/20/19 1643 04/20/19 2101 04/21/19 0622  GLUCAP 142* 127* 216* 155* 136*    Brief HPI:   Natasha Lawson is a 79 y.o. female with history of breat cancer, HTN, PAF, attempts at ACDF in the past with residual left-sided weakness and neuropathic symptoms, gait disorder with falls who started developing quadriplegia due to myelopathy.  She underwent ACDF 03/29/2019 with improvement in balance and dysesthesias and was discharged home on 12/16.  But she had problems with balance with recurrent falls as well as difficulty walking and was readmitted on 12/18.  MRI of cervical spine revealed postop changes with small subdural and/or intradural collection extending from C5-C7 with mass-effect on dorsal and right aspect and moderate compression fraction of cervical spinal  cord without signal changes.  She was started on IV Decadron with recommendations to hold chronic Coumadin through postop day 10.  Therapy evaluations completed revealing functional deficits and CIR was recommended due to functional deficits from myelopathy.    Hospital Course: Natasha Lawson was admitted to rehab 04/04/2019 for inpatient therapies to consist of PT, ST and OT at least three hours five days a week. Past admission physiatrist, therapy team and rehab RN have worked together to provide customized collaborative inpatient rehab. Flexeril was discontinued due to concerns of hallucinations and her pain control has improved and currently controlled on prn tylenol. Constipation has resolved with adjustment of bowel program. Her po intake has improved and she is continent of bowel and bladder. Blood pressures were monitored on TID basis and is controlled on current regimen. Serial check of lytes showed acute on chronic renal failure and Demadex was held for a couple of days with encouragement to increase fluid intake. Hypokalemia has resolved with increase in supplementation briefly. Renal status is back to baseline.   Cervical incision has been monitored daily and she did develop serous drainage from wound on 12/30 and Dr. Venetia Constable was contacted to evaluate incision. He felt that drainage from wound was due to mobilizing of seroma and she was covered with Keflex X one week for wound prophylaxis. Wound drainage is resolving and incision is intact with resolution of fluctuance.  Follow up CBC showed ABLA and thrombocytopenia is stable.  Steroid  induced hyperglycemia has been monitored with ac/hs  cbg checks and use of SSI. This is being tapered to off in the next 7 days and anticipate BS will continue to improve. Pharmacy has been assisting in coumadin management and INR is therapeutic at 2.3 at discharge. She was discharged on 2 mg MWF and 1 mg on TTSS. She is to check her protime on Monday and call PCP  with results. She is continent of bowel and bladder. She has made gains during rehab stay and is at supervision level. She will continue to receive follow up HHPT and [facility] by [facility] after discharge   Rehab course: During patient's stay in rehab weekly team conferences were held to monitor patient's progress, set goals and discuss barriers to discharge. At admission, patient required max assist with basic self care tasks and mod assist with mobility. She  has had improvement in activity tolerance, balance, postural control as well as ability to compensate for deficits. She is able to complete ADL tasks with supervision. She requires min assist for bed mobility,for transfer and to ambulate 54' with RW. Family education was completed with husband regarding all aspects of care and safety.     Disposition: Home  Diet: Regular  Special Instructions: 1. Wash incision with soap and water, pat dry and apply dry dressing daily. 2. No strenuous activity or driving till cleared by MD.  3. Recommend repeat of CBC in 1-2 weeks on post hospital follow up. 4. Patient to recheck protime 1/11 and contact PCP with results.   Discharge Instructions    Ambulatory referral to Physical Medicine Rehab   Complete by: As directed    1-2 weeks TC appointment     Allergies as of 04/21/2019      Reactions   Capoten [captopril] Swelling   Tongue   Iodine Hives   Norvasc [amlodipine Besylate] Swelling   Shellfish Allergy Hives   Terazosin Hcl Other (See Comments)   Caused a-fib   Xarelto [rivaroxaban] Other (See Comments)   Severe back pain   Welchol [colesevelam Hcl] Other (See Comments)   Confusion   Crestor [rosuvastatin Calcium] Other (See Comments)   Muscle aches   Fenofibrate Other (See Comments)   Muscle aches   Pravachol [pravastatin Sodium] Other (See Comments)   fatigue   Spironolactone Diarrhea   Statins Other (See Comments)   Joint pain      Medication List    STOP taking  these medications   cyclobenzaprine 10 MG tablet Commonly known as: FLEXERIL   oxyCODONE 5 MG immediate release tablet Commonly known as: Oxy IR/ROXICODONE     TAKE these medications   acetaminophen 325 MG tablet Commonly known as: TYLENOL Take 1-2 tablets (325-650 mg total) by mouth every 4 (four) hours as needed for mild pain.   allopurinol 300 MG tablet Commonly known as: ZYLOPRIM Take 300 mg by mouth daily with breakfast.   Bystolic 20 MG Tabs Generic drug: Nebivolol HCl Take 0.5 tablets (10 mg total) by mouth daily with breakfast. What changed: how much to take   dexamethasone 2 MG tablet Commonly known as: DECADRON Take 5 tablets (10 mg total) by mouth every 12 (twelve) hours. Till Saturday then decrease to once a day till gone   iron polysaccharides 150 MG capsule Commonly known as: NIFEREX Take 1 capsule (150 mg total) by mouth 2 (two) times daily with a meal.   omeprazole 20 MG capsule Commonly known as: PRILOSEC Take 20 mg by mouth daily before breakfast.   potassium chloride 10 MEQ  CR capsule Commonly known as: MICRO-K Take 10 mEq by mouth 2 (two) times daily.   PRESCRIPTION MEDICATION Inhale into the lungs at bedtime. CPAP   senna-docusate 8.6-50 MG tablet Commonly known as: Senokot-S Take 1 tablet by mouth at bedtime.   sertraline 100 MG tablet Commonly known as: ZOLOFT Take 100 mg by mouth daily with breakfast.   torsemide 20 MG tablet Commonly known as: DEMADEX Take 20 mg by mouth daily with breakfast.   Vitamin D (Ergocalciferol) 1.25 MG (50000 UT) Caps capsule Commonly known as: DRISDOL Take 50,000 Units by mouth every Friday.   warfarin 2 MG tablet Commonly known as: COUMADIN Take 1 tablet (2 mg total) by mouth See admin instructions. Take whole pill by mouth on Monday, Wednesday, Friday. Take 1/2 a pill on Tuesday, Thursday, Saturday and Sunday What changed:   additional instructions  Another medication with the same name was removed.  Continue taking this medication, and follow the directions you see here.      Follow-up Information    Lovorn, Jinny Blossom, MD Follow up.   Specialty: Physical Medicine and Rehabilitation Why: Office will call you with follow up appointment Contact information: [id number] [place] [address] [phone]        Judith Part, MD. Call on 04/22/2019.   Specialty: Neurosurgery Why: for post hospital follow up Contact information: [place] [address] [place], [place], NP Follow up on 05/03/2019.   Specialty: Cardiothoracic Surgery Why: appointment at 1:30 pm. Coumadin check on 04/26/19 at 10:15 am Contact information: [place] [address] [phone]        Moshe Cipro, MD .   Specialty: Internal Medicine Contact information: [place] [address] [phone]           Signed: Bary Leriche 04/21/2019, 9:47 PM

## 2019-04-21 NOTE — Progress Notes (Signed)
Provider discussed discharge instructions with patient and her husband at bedside. Patient packed all her belongings with assistance. No questions/concerns per patient. Patient's posterior neck dressing was changed before discharge. Discharged home with her husband.

## 2019-04-22 NOTE — Progress Notes (Signed)
Social Work Discharge Note   The overall goal for the admission was met for:   Discharge location: Yes - home with spouse and family in [place] of Stay: Yes - 17 days  Discharge activity level: Yes - supervision/ min assist  Home/community participation: Yes  Services provided included: MD, RD, PT, OT, RN, Pharmacy and SW  Financial Services: Medicare and Private Insurance: Generic  Follow-up services arranged: Home Health: PT, OT via [place], DME: 20x18 lightweight w/c, cushion via [employer] and Patient/Family has no preference for HH/DME agencies  Comments (or additional information):    Contact info:  Pt @ [phone] or spouse, Jenny Reichmann, [place] [phone]  Patient/Family verbalized understanding of follow-up arrangements: Yes  Individual responsible for coordination of the follow-up plan: pt  Confirmed correct DME delivered: Johana Hopkinson 04/22/2019    Melika Reder

## 2019-04-25 ENCOUNTER — Telehealth: Payer: Self-pay | Admitting: *Deleted

## 2019-04-25 NOTE — Telephone Encounter (Signed)
Transitional care call completed, appointment confirmed, address  Confirmed, new patient packet mailed   Questions for our staff to ask patients on Transitional care 48 hour phone call:   1. Are you/is patient experiencing any problems since coming home? Nothing new  Are there any questions regarding any aspect of care?   No  2. Are there any questions regarding medications administration/dosing? No  Are meds being taken as prescribed? Yes Patient should review meds with caller to confirm   3. Have there been any falls? Yes  4. Has Home Health been to the house and/or have they contacted you? Yes  If not, have you tried to contact them? Can we help you contact them?   5. Are bowels and bladder emptying properly?  No,  Are there any unexpected incontinence issues? Nothing new If applicable, is patient following bowel/bladder programs?   6. Any fevers, problems with breathing, unexpected pain?  No  7. Are there any skin problems or new areas of breakdown? Laceration during therapy, draining at incision site, dressing changed daily  8. Has the patient/family member arranged specialty MD follow up (ie cardiology/neurology/renal/surgical/etc)? Yes Can we help arrange?   9. Does the patient need any other services or support that we can help arrange? No   10. Are caregivers following through as expected in assisting the patient? Yes  11. Has the patient quit smoking, drinking alcohol, or using drugs as recommended? Never user

## 2019-04-27 ENCOUNTER — Telehealth: Payer: Self-pay

## 2019-04-27 NOTE — Telephone Encounter (Signed)
Patient called stating her arm riped during therapy and its beginning to get de-infected. Called and left message for patient to call back to clarify her message.

## 2019-05-02 ENCOUNTER — Encounter
Payer: Medicare Other | Attending: Physical Medicine and Rehabilitation | Admitting: Physical Medicine and Rehabilitation

## 2019-05-02 ENCOUNTER — Other Ambulatory Visit: Payer: Self-pay

## 2019-05-02 ENCOUNTER — Encounter: Payer: Self-pay | Admitting: Physical Medicine and Rehabilitation

## 2019-05-02 VITALS — BP 134/74 | HR 68 | Temp 97.2°F | Ht 67.0 in | Wt 233.0 lb

## 2019-05-02 DIAGNOSIS — R21 Rash and other nonspecific skin eruption: Secondary | ICD-10-CM | POA: Diagnosis present

## 2019-05-02 DIAGNOSIS — G959 Disease of spinal cord, unspecified: Secondary | ICD-10-CM | POA: Insufficient documentation

## 2019-05-02 NOTE — Progress Notes (Signed)
Subjective:    Patient ID: Natasha Lawson, female    DOB: [birth year], 79 y.o.   MRN: [id number]  HPI  Mrs. Darras presents for transitional care follow-up after CIR admission for cervical myelopathy.  She has been doing very well at home. Her pain has been well controlled and she has not required any medications. She has been receiving home PT and OT and ambulating within her home. Her husband has been providing assistance as needed.   She has been having drainage from her incision site, which also occurred in the hospital. It is yellow. She does not have much pain around the incision site.   She continues to have numbness and tingling in her bilateral hands but feels that this has been improving.   She has been taking all medications as prescribed and does not require any refills at this times. She has a follow-up with her PCP tomorrow.   She has a new left sided flank rash that is not too itchy or painful but does cause a dull burning pain. She is not sure what caused it.   She is no longer taking the tylenol, senna-docusate, or decadron.   She is taking the allopurinol, iron , nebivolol, prilosec, potassium chloride, Zoloft, Torsemide, Vitamin D, and Warfarin. She is getting her INRs checked.   Pain Inventory Average Pain 4 Pain Right Now 1 My pain is tingling and aching  In the last 24 hours, has pain interfered with the following? General activity 5 Relation with others 5 Enjoyment of life 5 What TIME of day is your pain at its worst? varies Sleep (in general) Good  Pain is worse with: standing Pain improves with: na Relief from Meds: na  Mobility walk with assistance ability to climb steps?  yes do you drive?  no use a wheelchair needs help with transfers  Function not employed: date last employed .  Neuro/Psych bladder control problems weakness numbness tingling trouble walking  Prior Studies Any changes since last visit?  no  Physicians involved in your  care Primary care Dr. Moshe Cipro Neurosurgeon Dr. Mellody Drown   Family History  Problem Relation Age of Onset  . Heart disease Mother   . Cancer - Lung Father   . Cancer Sister    Social History   Socioeconomic History  . Marital status: Married    Spouse name: Not on file  . Number of children: Not on file  . Years of education: Not on file  . Highest education level: Not on file  Occupational History  . Not on file  Tobacco Use  . Smoking status: Never Smoker  . Smokeless tobacco: Never Used  Substance and Sexual Activity  . Alcohol use: Never  . Drug use: Never  . Sexual activity: Not Currently    Birth control/protection: Post-menopausal    Comment: Hysterectomy  Other Topics Concern  . Not on file  Social History Narrative  . Not on file   Social Determinants of Health   Financial Resource Strain:   . Difficulty of Paying Living Expenses: Not on file  Food Insecurity:   . Worried About Charity fundraiser in the Last Year: Not on file  . Ran Out of Food in the Last Year: Not on file  Transportation Needs:   . Lack of Transportation (Medical): Not on file  . Lack of Transportation (Non-Medical): Not on file  Physical Activity:   . Days of Exercise per Week: Not on file  . Minutes of  Exercise per Session: Not on file  Stress:   . Feeling of Stress : Not on file  Social Connections:   . Frequency of Communication with Friends and Family: Not on file  . Frequency of Social Gatherings with Friends and Family: Not on file  . Attends Religious Services: Not on file  . Active Member of Clubs or Organizations: Not on file  . Attends Archivist Meetings: Not on file  . Marital Status: Not on file   Past Surgical History:  Procedure Laterality Date  . ABDOMINAL HYSTERECTOMY    . BREAST SURGERY Right 08/14/2014   lumpectomy/lynph nodes  . BREAST SURGERY Right 10/05/2016   mass removed  . CARDIAC CATHETERIZATION    . CARPAL TUNNEL RELEASE  Bilateral 10/01/11, 12/02/11  . CHOLECYSTECTOMY  1987  . COLONOSCOPY    . DILATION AND CURETTAGE OF UTERUS  10/10/2008   hysteroscopy with biopsy  . EYE SURGERY Bilateral    removed cataracts  . HYSTEROSCOPY WITH D & C  08/19/2011  . INCONTINENCE SURGERY  01/11/2007  . JOINT REPLACEMENT Right 10/18/2013  . laparoscopy bilateral salpingo-oophorectomy  01/27/2012  . NECK SURGERY  11/16/2017  . pci lad  01/02/2015   stent   . POSTERIOR CERVICAL FUSION/FORAMINOTOMY N/A 03/29/2019   Procedure: Cervical Five to Cervical Seven Posterior cervical laminectomy and instrumented fusion;  Surgeon: Judith Part, MD;  Location: [facility];  Service: Neurosurgery;  Laterality: N/A;  Cervical Five to Cervical Seven Posterior cervical laminectomy and instrumented fusion  . radiation breast canceer Right    from 12/08/14-01/12/15  . rotator cuff  Right 04/26/2010   repair  . TONSILLECTOMY  1954  . TUBAL LIGATION  1984  . UPPER GI ENDOSCOPY  2016   Past Medical History:  Diagnosis Date  . Anxiety   . [facility])    breast cancer right  . Dysrhythmia    A-Fib on coumadin  . GERD (gastroesophageal reflux disease)   . Gout   . 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 History of blood transfusion   . Hypertension   . Sleep apnea    uses CPAP nightly   BP 134/74   Pulse 68   Temp (!) 97.2 F (36.2 C)   Ht 5\' 7"  (1.702 m)   Wt 233 lb (105.7 kg)   LMP  (LMP Unknown)   SpO2 98%   BMI 36.49 kg/m   Opioid Risk Score:   Fall Risk Score:  `1  Depression screen PHQ 2/9  Depression screen PHQ 2/9 05/02/2019  Decreased Interest 0  Down, Depressed, Hopeless 0  PHQ - 2 Score 0   Review of Systems  Respiratory: Positive for apnea and shortness of breath.   Musculoskeletal: Positive for gait problem and neck pain.  Skin: Positive for rash.  Neurological: Positive for weakness and numbness.  All other systems reviewed and are negative.      Objective:   Physical Exam Constitutional: No  distress . Vital signs reviewed.  Morbidly obese. Husband accompanies patient.  HENT: Normocephalic.  Atraumatic. Eyes: EOMI. No discharge. Cardiovascular: irregular but regular rate Respiratory: Normal effort.  No stridor. CTA B/L GI: Non-distended. Skin: Incision - on neck- C/D/I. With purple bruising. No active drainage in clinic.  Psych: bright affect Musc: Lower extremity edema. Neuro: Alert Motor: Grossly 4-4+/5 throughout Sensation diminished to light touch in bilateral hands, ankles/feet        Assessment & Plan:  1. Impaired mobility  and ADLs secondary to cervical myelopathy Continue Home PT and OT.       Numbness and tingling in hands continuing to improve.  2. Antithrombotics: -DVT/anticoagulation: Pharmaceutical: Continue Coumadin. She is getting INR checks. 3. Pain Management: Well controlled, not requiring medications.  4. Mood: Very positive.  5. Neuropsych: This patient is capable of making decisions on her own behalf. 6. Skin/Wound Care: Has new rash on her left flank that is large, in a dermatomal distribution, and appears flat with petichiae. It is not very painful or itchy to her. Recommended that she consult her PCP when she see them tomorrow.  7. HTN/Peripheral edema: Stable in office today.  Can follow up PRN. Very pleased with her progress!

## 2019-06-28 ENCOUNTER — Telehealth: Payer: Self-pay

## 2019-06-28 DIAGNOSIS — N3946 Mixed incontinence: Secondary | ICD-10-CM

## 2019-06-28 NOTE — Telephone Encounter (Signed)
Patient calling asking about the referral to a Neurologist. No order in referrals.

## 2019-06-28 NOTE — Telephone Encounter (Signed)
Pt's last note wasn't mentioning a Neurologist and she's not telling me why in this current phone call.   I don't have any reason to refer her to a Neurologist???? Can we get more information to let us know why- we are not allowed to put in referral without explaining why? Or, she can call her PCP and ask for one- either way.    Thank you.

## 2019-06-29 NOTE — Telephone Encounter (Signed)
My mistake it is a Dealer for incontinence and she is still having it severely she states.

## 2019-06-29 NOTE — Telephone Encounter (Signed)
Put in Urology consult for incontinence. Thank you

## 2021-06-24 NOTE — Progress Notes (Unsigned)
[address], [place] [address]   CLINIC:  Medical Oncology/Hematology  CONSULT NOTE  Patient Care Team: Moshe Cipro, MD as PCP - General (Internal Medicine)  CHIEF COMPLAINTS/PURPOSE OF CONSULTATION:  ***  HISTORY OF PRESENTING ILLNESS:  Natasha Lawson 81 y.o. female is here because of ***  MEDICAL HISTORY:  Past Medical History:  Diagnosis Date   Anxiety    Cancer ([place])    breast cancer right   Dysrhythmia    A-Fib on coumadin   GERD (gastroesophageal reflux disease)    Gout    Hearing loss    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   History of blood transfusion    Hypertension    Sleep apnea    uses CPAP nightly    SURGICAL HISTORY: Past Surgical History:  Procedure Laterality Date   ABDOMINAL HYSTERECTOMY     BREAST SURGERY Right 08/14/2014   lumpectomy/lynph nodes   BREAST SURGERY Right 10/05/2016   mass removed   [place] Bilateral 10/01/11, 12/02/11   [place] OF UTERUS  10/10/2008   hysteroscopy with biopsy   EYE SURGERY Bilateral    removed cataracts   HYSTEROSCOPY WITH D & C  08/19/2011   INCONTINENCE SURGERY  01/11/2007   JOINT REPLACEMENT Right 10/18/2013   laparoscopy bilateral salpingo-oophorectomy  01/27/2012   NECK SURGERY  11/16/2017   pci lad  01/02/2015   stent    POSTERIOR CERVICAL FUSION/FORAMINOTOMY N/A 03/29/2019   Procedure: Cervical Five to Cervical Seven Posterior cervical laminectomy and instrumented fusion;  Surgeon: Judith Part, MD;  Location: [facility];  Service: Neurosurgery;  Laterality: N/A;  Cervical Five to Cervical Seven Posterior cervical laminectomy and instrumented fusion   radiation breast canceer Right    from 12/08/14-01/12/15   rotator cuff  Right 04/26/2010   repair   [place]   UPPER GI ENDOSCOPY  2016    SOCIAL HISTORY: Social History   Socioeconomic  History   Marital status: Married    Spouse name: Not on file   Number of children: Not on file   Years of education: Not on file   Highest education level: Not on file  Occupational History   Not on file  Tobacco Use   Smoking status: Never   Smokeless tobacco: Never  Vaping Use   Vaping Use: Never used  Substance and Sexual Activity   Alcohol use: Never   Drug use: Never   Sexual activity: Not Currently    Birth control/protection: Post-menopausal    Comment: Hysterectomy  Other Topics Concern   Not on file  Social History Narrative   Not on file   Social Determinants of Health   Financial Resource Strain: Not on file  Food Insecurity: Not on file  Transportation Needs: Not on file  Physical Activity: Not on file  Stress: Not on file  Social Connections: Not on file  Intimate Partner Violence: Not on file    FAMILY HISTORY: Family History  Problem Relation Age of Onset   Heart disease Mother    Cancer - Lung Father    Cancer Sister     ALLERGIES:  is allergic to capoten [captopril], iodine, norvasc [amlodipine besylate], shellfish allergy, terazosin hcl, xarelto [rivaroxaban], welchol [colesevelam hcl], crestor [rosuvastatin calcium], fenofibrate, pravachol [pravastatin sodium], spironolactone, and statins.  MEDICATIONS:  Current  Outpatient Medications  Medication Sig Dispense Refill   allopurinol (ZYLOPRIM) 300 MG tablet Take 300 mg by mouth daily with breakfast.      iron polysaccharides (NIFEREX) 150 MG capsule Take 1 capsule (150 mg total) by mouth 2 (two) times daily with a meal. 60 capsule 0   Nebivolol HCl (BYSTOLIC) 20 MG TABS Take 0.5 tablets (10 mg total) by mouth daily with breakfast. 30 tablet 0   omeprazole (PRILOSEC) 20 MG capsule Take 20 mg by mouth daily before breakfast.     potassium chloride (MICRO-K) 10 MEQ CR capsule Take 10 mEq by mouth 2 (two) times daily.     PRESCRIPTION MEDICATION Inhale into the lungs at bedtime. CPAP     sertraline  (ZOLOFT) 100 MG tablet Take 100 mg by mouth daily with breakfast.      torsemide (DEMADEX) 20 MG tablet Take 20 mg by mouth daily with breakfast.      Vitamin D, Ergocalciferol, (DRISDOL) 1.25 MG (50000 UT) CAPS capsule Take 50,000 Units by mouth every Friday.      warfarin (COUMADIN) 2 MG tablet Take 1 tablet (2 mg total) by mouth See admin instructions. Take whole pill by mouth on Monday, Wednesday, Friday. Take 1/2 a pill on Tuesday, Thursday, Saturday and Sunday     No current facility-administered medications for this visit.    REVIEW OF SYSTEMS:   Review of Systems - Oncology    PHYSICAL EXAMINATION: ECOG PERFORMANCE STATUS: {CHL ONC ECOG PS:[phone]}  There were no vitals filed for this visit. There were no vitals filed for this visit.  Physical Exam    LABORATORY DATA:  I have reviewed the data as listed No results found for this or any previous visit (from the past 2160 hour(s)).  RADIOGRAPHIC STUDIES: I have personally reviewed the radiological images as listed and agreed with the findings in the report. No results found.  ASSESSMENT & PLAN: ***   PLAN SUMMARY & DISPOSITION: ***  All questions were answered. The patient knows to call the clinic with any problems, questions or concerns.   Medical decision making: ***  Time spent on visit: I spent {CHL ONC TIME VISIT - JGGEZ:[id number]} counseling the patient face to face. The total time spent in the appointment was {CHL ONC TIME VISIT - TKPTW:[id number]} and more than 50% was on counseling.  I, Tarri Abernethy PA-C, have seen this patient in conjunction with Dr. Derek Jack.  Greater than 50% of visit was performed by Dr. Delton Coombes.   Harriett Rush, PA-C *** ***  DR. Delton CoombesMarland Kitchen

## 2021-06-25 ENCOUNTER — Encounter (HOSPITAL_COMMUNITY): Payer: Self-pay | Admitting: Hematology

## 2021-06-25 ENCOUNTER — Inpatient Hospital Stay (HOSPITAL_COMMUNITY): Payer: Medicare Other | Attending: Hematology | Admitting: Hematology

## 2021-06-25 ENCOUNTER — Inpatient Hospital Stay (HOSPITAL_COMMUNITY): Payer: Medicare Other

## 2021-06-25 VITALS — BP 135/61 | HR 64 | Temp 97.0°F | Resp 18 | Ht 67.0 in | Wt 218.2 lb

## 2021-06-25 DIAGNOSIS — M109 Gout, unspecified: Secondary | ICD-10-CM | POA: Insufficient documentation

## 2021-06-25 DIAGNOSIS — I480 Paroxysmal atrial fibrillation: Secondary | ICD-10-CM | POA: Insufficient documentation

## 2021-06-25 DIAGNOSIS — Z923 Personal history of irradiation: Secondary | ICD-10-CM | POA: Insufficient documentation

## 2021-06-25 DIAGNOSIS — Z853 Personal history of malignant neoplasm of breast: Secondary | ICD-10-CM | POA: Diagnosis not present

## 2021-06-25 DIAGNOSIS — Z801 Family history of malignant neoplasm of trachea, bronchus and lung: Secondary | ICD-10-CM | POA: Diagnosis not present

## 2021-06-25 DIAGNOSIS — Z7901 Long term (current) use of anticoagulants: Secondary | ICD-10-CM

## 2021-06-25 DIAGNOSIS — R791 Abnormal coagulation profile: Secondary | ICD-10-CM | POA: Diagnosis not present

## 2021-06-25 DIAGNOSIS — I5032 Chronic diastolic (congestive) heart failure: Secondary | ICD-10-CM | POA: Insufficient documentation

## 2021-06-25 DIAGNOSIS — D508 Other iron deficiency anemias: Secondary | ICD-10-CM

## 2021-06-25 DIAGNOSIS — D696 Thrombocytopenia, unspecified: Secondary | ICD-10-CM

## 2021-06-25 DIAGNOSIS — I110 Hypertensive heart disease with heart failure: Secondary | ICD-10-CM | POA: Diagnosis not present

## 2021-06-25 DIAGNOSIS — D509 Iron deficiency anemia, unspecified: Secondary | ICD-10-CM | POA: Insufficient documentation

## 2021-06-25 DIAGNOSIS — Z79899 Other long term (current) drug therapy: Secondary | ICD-10-CM | POA: Insufficient documentation

## 2021-06-25 LAB — CBC WITH DIFFERENTIAL/PLATELET
Abs Immature Granulocytes: 0.01 10*3/uL (ref 0.00–0.07)
Basophils Absolute: 0 10*3/uL (ref 0.0–0.1)
Basophils Relative: 1 %
Eosinophils Absolute: 0 10*3/uL (ref 0.0–0.5)
Eosinophils Relative: 1 %
HCT: 34.7 % — ABNORMAL LOW (ref 36.0–46.0)
Hemoglobin: 11 g/dL — ABNORMAL LOW (ref 12.0–15.0)
Immature Granulocytes: 0 %
Lymphocytes Relative: 19 %
Lymphs Abs: 0.8 10*3/uL (ref 0.7–4.0)
MCH: 32.8 pg (ref 26.0–34.0)
MCHC: 31.7 g/dL (ref 30.0–36.0)
MCV: 103.6 fL — ABNORMAL HIGH (ref 80.0–100.0)
Monocytes Absolute: 0.5 10*3/uL (ref 0.1–1.0)
Monocytes Relative: 11 %
Neutro Abs: 2.9 10*3/uL (ref 1.7–7.7)
Neutrophils Relative %: 68 %
Platelets: 126 10*3/uL — ABNORMAL LOW (ref 150–400)
RBC: 3.35 MIL/uL — ABNORMAL LOW (ref 3.87–5.11)
RDW: 14.2 % (ref 11.5–15.5)
WBC: 4.3 10*3/uL (ref 4.0–10.5)
nRBC: 0 % (ref 0.0–0.2)

## 2021-06-25 LAB — HEPATIC FUNCTION PANEL
ALT: 15 U/L (ref 0–44)
AST: 17 U/L (ref 15–41)
Albumin: 3.8 g/dL (ref 3.5–5.0)
Alkaline Phosphatase: 124 U/L (ref 38–126)
Bilirubin, Direct: 0.1 mg/dL (ref 0.0–0.2)
Indirect Bilirubin: 0.5 mg/dL (ref 0.3–0.9)
Total Bilirubin: 0.6 mg/dL (ref 0.3–1.2)
Total Protein: 6.8 g/dL (ref 6.5–8.1)

## 2021-06-25 LAB — IRON AND TIBC
Iron: 57 ug/dL (ref 28–170)
Saturation Ratios: 18 % (ref 10.4–31.8)
TIBC: 315 ug/dL (ref 250–450)
UIBC: 258 ug/dL

## 2021-06-25 LAB — FOLATE: Folate: 20.3 ng/mL (ref >5.9)

## 2021-06-25 LAB — PROTIME-INR
INR: 2.2 — ABNORMAL HIGH (ref 0.8–1.2)
Prothrombin Time: 24 seconds — ABNORMAL HIGH (ref 11.4–15.2)

## 2021-06-25 LAB — FIBRINOGEN: Fibrinogen: 426 mg/dL (ref 210–475)

## 2021-06-25 LAB — FERRITIN: Ferritin: 71 ng/mL (ref 11–307)

## 2021-06-25 LAB — VITAMIN B12: Vitamin B-12: 248 pg/mL (ref 180–914)

## 2021-06-25 LAB — APTT: aPTT: 39 seconds — ABNORMAL HIGH (ref 24–36)

## 2021-06-25 NOTE — Patient Instructions (Addendum)
[facility] at [facility] ?Discharge Instructions ? ?You were seen today by Dr. Delton Coombes & Tarri Abernethy PA-C for your elevated INR. ? ?As we discussed, there are many factors that can affect how your body processes warfarin.  We will check several labs today to try to determine what could be causing this variability in your INR.  We will also check an ultrasound of your liver to see if you have any liver disease that is causing issues with your warfarin and INR. ? ?Due to your history of anemia and low platelets, we will also check your blood levels, iron levels, and other related tests. ? ?Continue to follow-up with your cardiology office for monitoring of your INR and adjustment of your warfarin. ? ?Please speak with your primary care provider about switching your gout medication.  Instead of allopurinol, I suggest that you start taking Uloric (febuxostat).  Allopurinol can interact with warfarin and cause your blood to be too thin (increased effective warfarin).  However, warfarin does not interact with Uloric, so this may be a better medication for you.  **This medication may require prior authorization by your insurance company. ? ?FOLLOW-UP APPOINTMENT: Office visit in 4 weeks, after labs and ultrasound ? ? ?Thank you for choosing [place] at [facility] to provide your oncology and hematology care.  To afford each patient quality time with our provider, please arrive at least 15 minutes before your scheduled appointment time.  ? ?If you have a lab appointment with the [facility] please come in thru the Main Entrance and check in at the main information desk. ? ?You need to re-schedule your appointment should you arrive 10 or more minutes late.  We strive to give you quality time with our providers, and arriving late affects you and other patients whose appointments are after yours.  Also, if you no show three or more times for appointments you may be  dismissed from the clinic at the providers discretion.     ?Again, thank you for choosing [facility].  Our hope is that these requests will decrease the amount of time that you wait before being seen by our physicians.       ?_____________________________________________________________ ? ?Should you have questions after your visit to [facility], please contact our office at [phone] and follow the prompts.  Our office hours are 8:00 a.m. and 4:30 p.m. Monday - Friday.  Please note that voicemails left after 4:00 p.m. may not be returned until the following business day.  We are closed weekends and major holidays.  You do have access to a nurse 24-7, just call the main number to the clinic [phone] and do not press any options, hold on the line and a nurse will answer the phone.   ? ?For prescription refill requests, have your pharmacy contact our office and allow 72 hours.   ? ?Due to Covid, you will need to wear a mask upon entering the hospital. If you do not have a mask, a mask will be given to you at the Main Entrance upon arrival. For doctor visits, patients may have 1 support person age 27 or older with them. For treatment visits, patients can not have anyone with them due to social distancing guidelines and our immunocompromised population.  ? ? ?

## 2021-06-25 NOTE — Progress Notes (Signed)
Misc lab was inadvertently discontinued by interface.  Order was a Radiation protection practitioner send out to [employer].  Georgia Duff in the lab notified of lab and instructions faxed to her so that she can get it to the proper laboratory for processing.  Will advise if there are any issues with this order. ?

## 2021-06-26 LAB — COPPER, SERUM: Copper: 105 ug/dL (ref 80–158)

## 2021-06-27 LAB — MISC LABCORP TEST (SEND OUT)

## 2021-06-27 LAB — METHYLMALONIC ACID, SERUM: Methylmalonic Acid, Quantitative: 454 nmol/L — ABNORMAL HIGH (ref 0–378)

## 2021-07-09 ENCOUNTER — Other Ambulatory Visit: Payer: Self-pay

## 2021-07-09 ENCOUNTER — Ambulatory Visit (HOSPITAL_COMMUNITY)
Admission: RE | Admit: 2021-07-09 | Discharge: 2021-07-09 | Disposition: A | Discharge status: Home / Self Care | Payer: Medicare Other | Source: Ambulatory Visit | Attending: Physician Assistant | Admitting: Physician Assistant

## 2021-07-09 DIAGNOSIS — R791 Abnormal coagulation profile: Secondary | ICD-10-CM | POA: Diagnosis not present

## 2021-07-09 IMAGING — US US ABDOMEN LIMITED
1 series · 14 of 25 positions shown · non-contrast
Comparison: None.

CLINICAL DATA: Possible fatty liver.

EXAM:
ULTRASOUND ABDOMEN LIMITED RIGHT UPPER QUADRANT

[Series 1: us abdomen limited · 0.25mm/px · 14 of 41 slices shown]
[im 1/41]
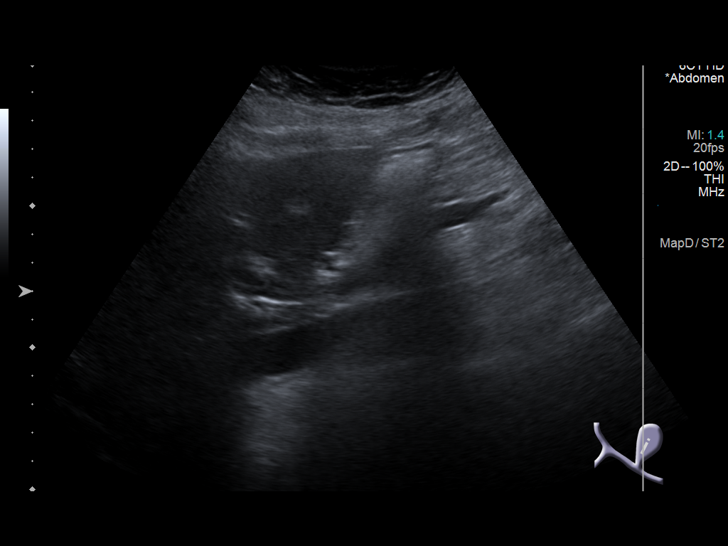
[im 4/41]
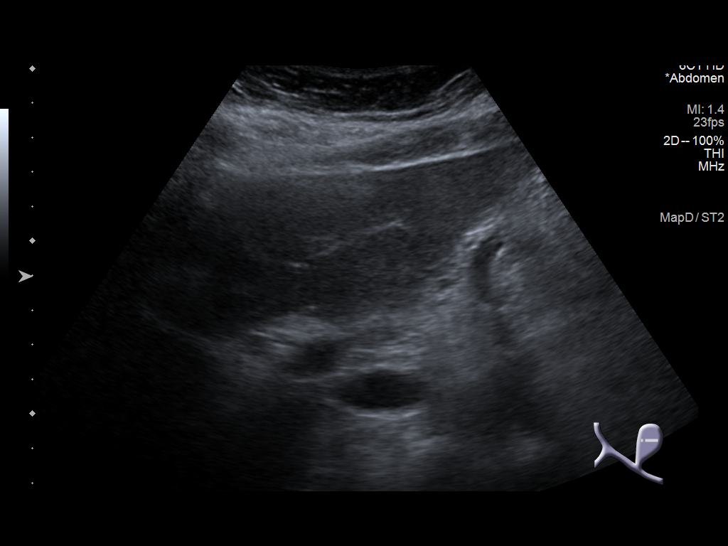
[im 7/41]
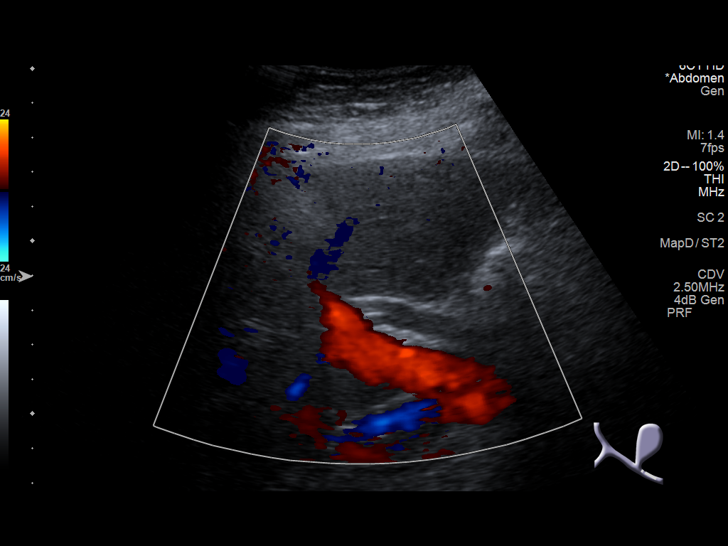
[im 11/41]
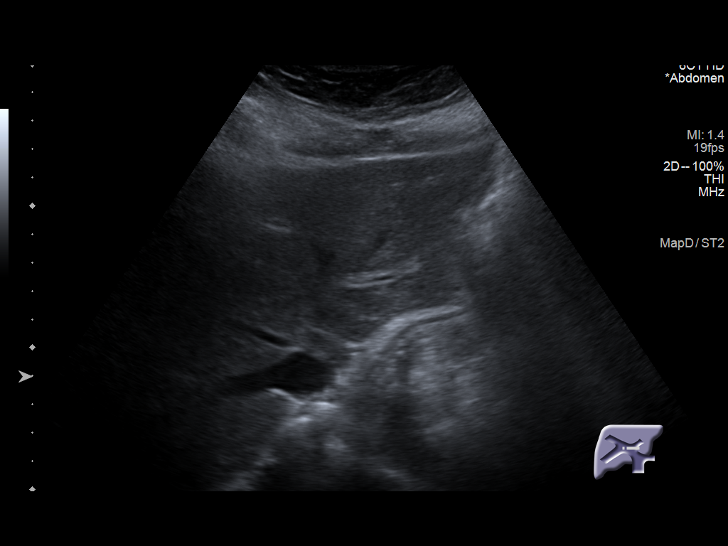
[im 14/41]
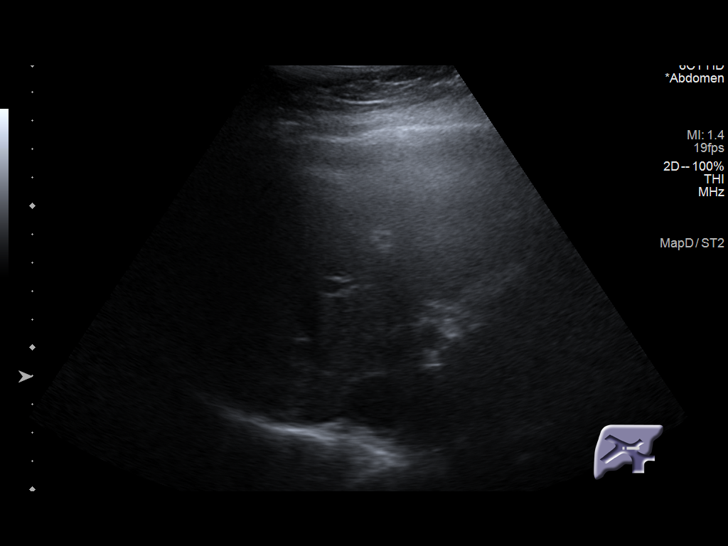
[im 16/41]
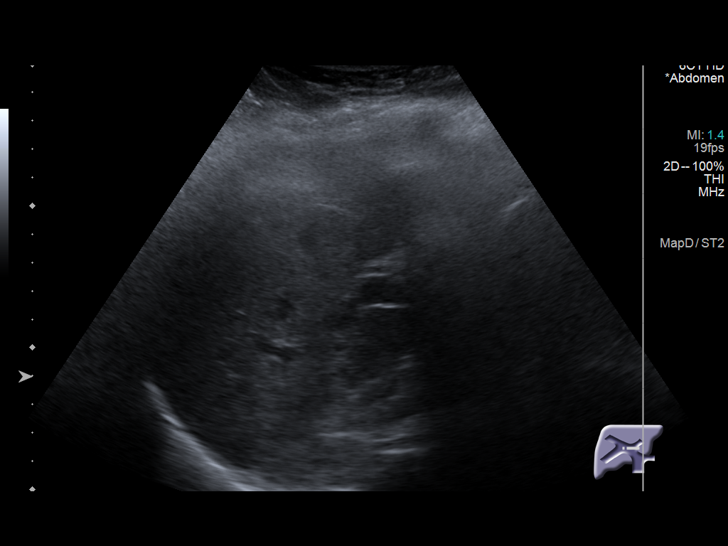
[im 19/41]
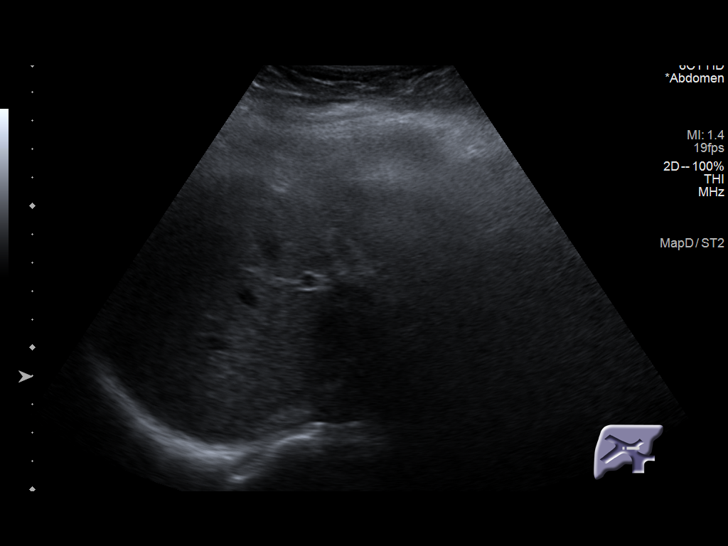
[im 22/41]
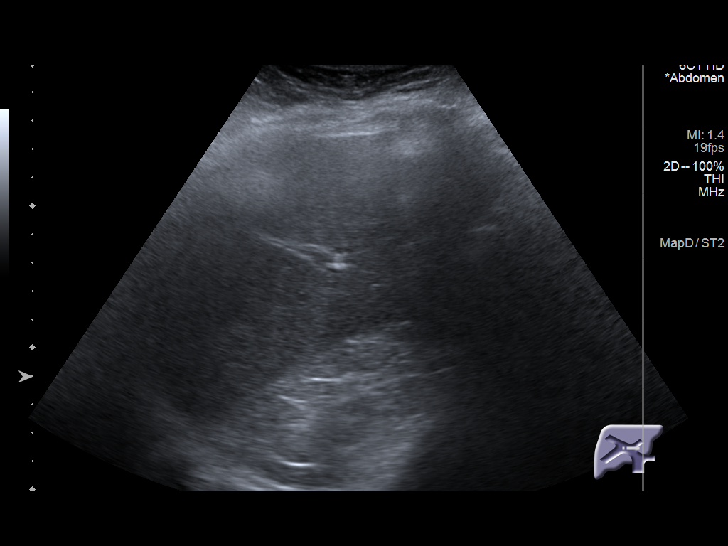
[im 26/41]
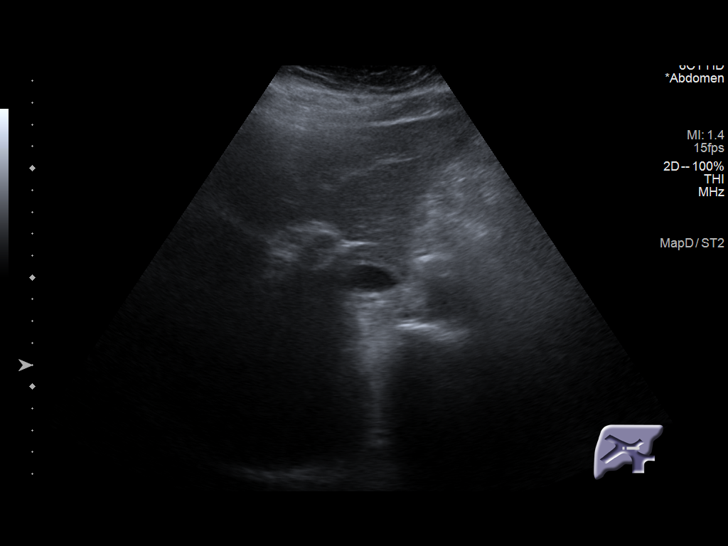
[im 27/41]
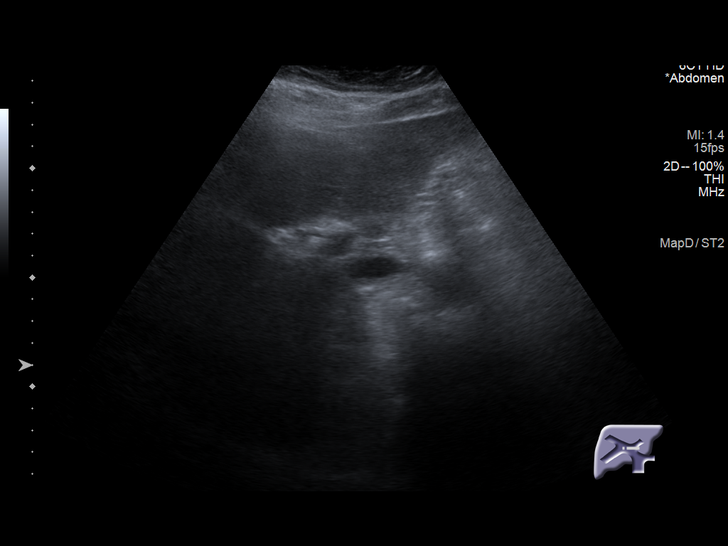
[im 31/41]
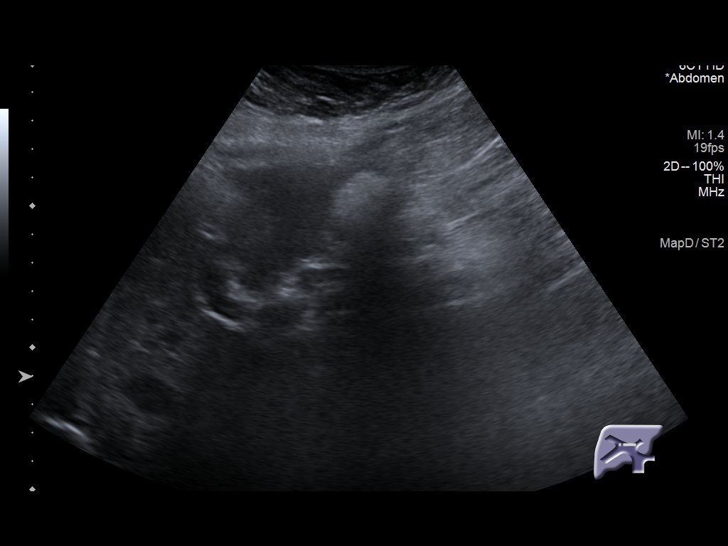
[im 34/41]
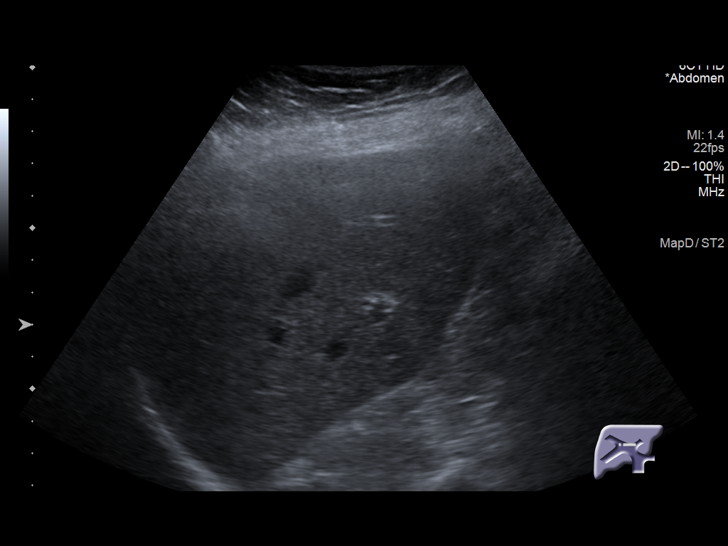
[im 37/41]
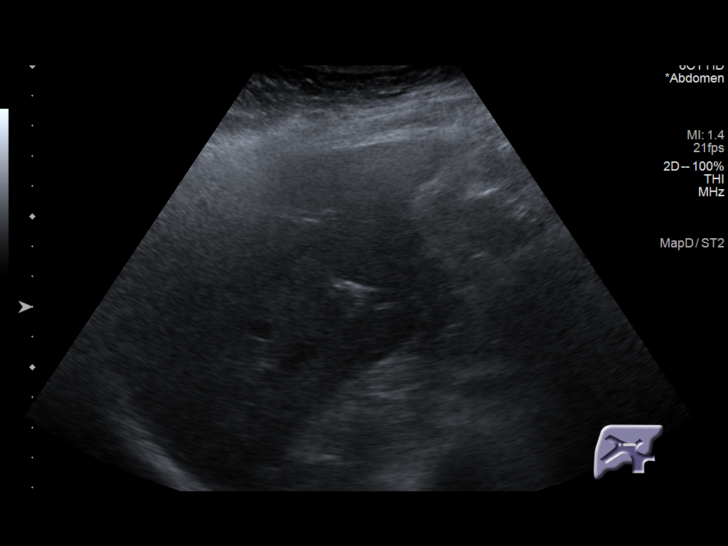
[im 41/41]
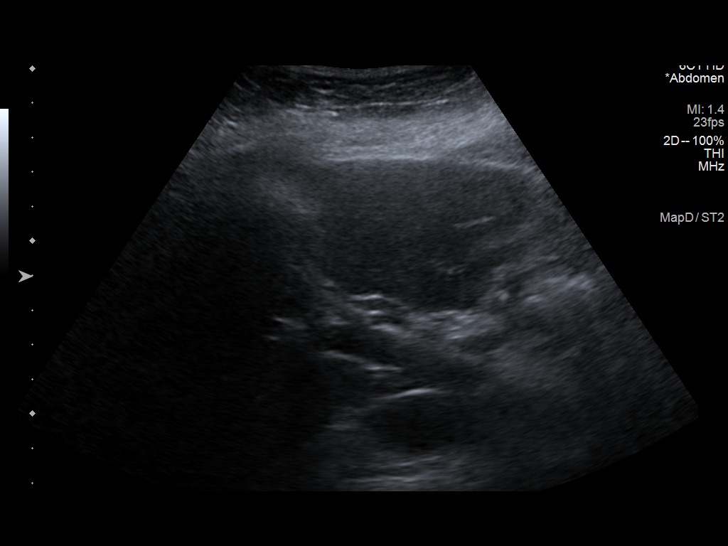

[14 of 25 positions shown; findings below may reference images not displayed]

FINDINGS: Evaluation is limited due to body habitus and bowel gas.

Gallbladder:

Cholecystectomy.

Common bile duct:

Diameter: 7 mm

Liver:

There is diffuse increased liver echogenicity most commonly seen in
the setting of fatty infiltration. Superimposed inflammation or
fibrosis is not excluded. Clinical correlation is recommended.
Portal vein is patent on color Doppler imaging with normal direction
of blood flow towards the liver.

Other: None.
IMPRESSION: 1. Fatty liver.
2. Cholecystectomy.

## 2021-07-17 ENCOUNTER — Other Ambulatory Visit (HOSPITAL_COMMUNITY): Payer: Self-pay | Admitting: Neurological Surgery

## 2021-07-17 ENCOUNTER — Other Ambulatory Visit: Payer: Self-pay | Admitting: Neurological Surgery

## 2021-07-17 DIAGNOSIS — G959 Disease of spinal cord, unspecified: Secondary | ICD-10-CM

## 2021-07-17 DIAGNOSIS — M5416 Radiculopathy, lumbar region: Secondary | ICD-10-CM

## 2021-07-22 NOTE — Progress Notes (Signed)
? ?[place] ?[address] ?[place], [place] [address] ? ? ?CLINIC:  ?Medical Oncology/Hematology ? ?PCP:  ?Moshe Cipro, MD ?[place] [address] ?[phone] ? ? ?REASON FOR VISIT:  ?Follow-up for supratherapeutic INR ? ?CURRENT THERAPY: Warfarin ? ?INTERVAL HISTORY:  ?[name] 81 y.o. female returns for routine follow-up of her supratherapeutic INR.  She was seen for initial consultation by Dr. Delton Coombes and Tarri Abernethy PA-C on 06/25/2021. ? ?At today's visit, she reports feeling about the same.  She has not had any changes in her health since her visit last month. ?She remains on warfarin for stroke prophylaxis in the setting of paroxysmal atrial fibrillation. ?She is currently taking warfarin 1.5 mg, and reports that her last INR was 2.1 when it was checked last week.  She continues to report easy bruising.  She denies any bleeding such as epistaxis, hematemesis, hematochezia, melena, hematuria, or gum bleeding. ?She reports that she is not taking febuxostat instead of allopurinol. ? ?She has 50% energy and 100% appetite. She endorses that she is maintaining a stable weight. ? ? ?REVIEW OF SYSTEMS:  ?Review of Systems  ?Constitutional:  Positive for fatigue. Negative for appetite change, chills, diaphoresis, fever and unexpected weight change.  ?HENT:   Negative for lump/mass and nosebleeds.   ?Eyes:  Negative for eye problems.  ?Respiratory:  Positive for shortness of breath (with exertion). Negative for cough and hemoptysis.   ?Cardiovascular:  Negative for chest pain, leg swelling and palpitations.  ?Gastrointestinal:  Negative for abdominal pain, blood in stool, constipation, diarrhea, nausea and vomiting.  ?Genitourinary:  Positive for bladder incontinence. Negative for hematuria.   ?Skin: Negative.   ?Neurological:  Positive for numbness (tingling in hands and feet). Negative for dizziness, headaches and light-headedness.  ?Hematological:  Does not  bruise/bleed easily.   ? ? ?PAST MEDICAL/SURGICAL HISTORY:  ?Past Medical History:  ?Diagnosis Date  ? Anxiety   ? Cancer [facility])   ? breast cancer right  ? Dysrhythmia   ? A-Fib on coumadin  ? GERD (gastroesophageal reflux disease)   ? Gout   ? Hearing loss   ? 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  ? History of blood transfusion   ? Hypertension   ? Sleep apnea   ? uses CPAP nightly  ? ?Past Surgical History:  ?Procedure Laterality Date  ? ABDOMINAL HYSTERECTOMY    ? BREAST SURGERY Right 08/14/2014  ? lumpectomy/lynph nodes  ? BREAST SURGERY Right 10/05/2016  ? mass removed  ? CARDIAC CATHETERIZATION    ? CARPAL TUNNEL RELEASE Bilateral 10/01/11, 12/02/11  ? CHOLECYSTECTOMY  1987  ? COLONOSCOPY    ? DILATION AND CURETTAGE OF UTERUS  10/10/2008  ? hysteroscopy with biopsy  ? EYE SURGERY Bilateral   ? removed cataracts  ? HYSTEROSCOPY WITH D & C  08/19/2011  ? INCONTINENCE SURGERY  01/11/2007  ? JOINT REPLACEMENT Right 10/18/2013  ? laparoscopy bilateral salpingo-oophorectomy  01/27/2012  ? NECK SURGERY  11/16/2017  ? pci lad  01/02/2015  ? stent   ? POSTERIOR CERVICAL FUSION/FORAMINOTOMY N/A 03/29/2019  ? Procedure: Cervical Five to Cervical Seven Posterior cervical laminectomy and instrumented fusion;  Surgeon: Judith Part, MD;  Location: [facility];  Service: Neurosurgery;  Laterality: N/A;  Cervical Five to Cervical Seven Posterior cervical laminectomy and instrumented fusion  ? radiation breast canceer Right   ? from 12/08/14-01/12/15  ? rotator cuff  Right 04/26/2010  ? repair  ? TONSILLECTOMY  1954  ?  TUBAL LIGATION  1984  ? UPPER GI ENDOSCOPY  2016  ? ? ? ?SOCIAL HISTORY:  ?Social History  ? ?Socioeconomic History  ? Marital status: Married  ?  Spouse name: Not on file  ? Number of children: Not on file  ? Years of education: Not on file  ? Highest education level: Not on file  ?Occupational History  ? Not on file  ?Tobacco Use  ? Smoking status: Never  ? Smokeless tobacco: Never  ?Vaping Use  ? Vaping Use: Never used   ?Substance and Sexual Activity  ? Alcohol use: Never  ? Drug use: Never  ? Sexual activity: Not Currently  ?  Birth control/protection: Post-menopausal  ?  Comment: Hysterectomy  ?Other Topics Concern  ? Not on file  ?Social History Narrative  ? Not on file  ? ?Social Determinants of Health  ? ?Financial Resource Strain: Not on file  ?Food Insecurity: Not on file  ?Transportation Needs: Not on file  ?Physical Activity: Not on file  ?Stress: Not on file  ?Social Connections: Not on file  ?Intimate Partner Violence: Not on file  ? ? ?FAMILY HISTORY:  ?Family History  ?Problem Relation Age of Onset  ? Heart disease Mother   ? Cancer - Lung Father   ? Cancer Sister   ? ? ?CURRENT MEDICATIONS:  ?Outpatient Encounter Medications as of 07/23/2021  ?Medication Sig Note  ? allopurinol (ZYLOPRIM) 300 MG tablet Take 300 mg by mouth daily with breakfast.    ? iron polysaccharides (NIFEREX) 150 MG capsule Take 1 capsule (150 mg total) by mouth 2 (two) times daily with a meal.   ? Multiple Vitamins-Minerals (ONCOVITE) TABS Take by mouth.   ? omeprazole (PRILOSEC) 20 MG capsule Take 20 mg by mouth daily before breakfast.   ? potassium chloride (MICRO-K) 10 MEQ CR capsule Take 10 mEq by mouth 2 (two) times daily.   ? PRESCRIPTION MEDICATION Inhale into the lungs at bedtime. CPAP 04/01/2019: Pt unable to use post surgery because of pressure it places on back of head - will resume when MD approves use  ? sertraline (ZOLOFT) 100 MG tablet Take 100 mg by mouth daily with breakfast.    ? torsemide (DEMADEX) 20 MG tablet Take 20 mg by mouth daily with breakfast.    ? Vitamin D, Ergocalciferol, (DRISDOL) 1.25 MG (50000 UT) CAPS capsule Take 50,000 Units by mouth every Friday.    ? warfarin (COUMADIN) 3 MG tablet Take 1.5 mg by mouth daily.   ? ?No facility-administered encounter medications on file as of 07/23/2021.  ? ? ?ALLERGIES:  ?Allergies  ?Allergen Reactions  ? Capoten [Captopril] Swelling  ?  Tongue ?  ? Iodine Hives  ? Norvasc  [Amlodipine Besylate] Swelling  ? Shellfish Allergy Hives  ? Terazosin Hcl Other (See Comments)  ?  Caused a-fib  ? Xarelto [Rivaroxaban] Other (See Comments)  ?  Severe back pain  ? Welchol [Colesevelam Hcl] Other (See Comments)  ?  Confusion ?  ? Crestor [Rosuvastatin Calcium] Other (See Comments)  ?  Muscle aches  ? Fenofibrate Other (See Comments)  ?  Muscle aches  ? Pravachol [Pravastatin Sodium] Other (See Comments)  ?  fatigue  ? Spironolactone Diarrhea  ? Statins Other (See Comments)  ?  Joint pain  ? ? ? ?PHYSICAL EXAM:  ?ECOG PERFORMANCE STATUS: 2 - Symptomatic, <50% confined to bed ? ?There were no vitals filed for this visit. ?There were no vitals filed for this visit. ?Physical Exam ?  Constitutional:   ?   Appearance: Normal appearance. She is obese.  ?   Comments: Presents in wheelchair  ?HENT:  ?   Head: Normocephalic and atraumatic.  ?   Mouth/Throat:  ?   Mouth: Mucous membranes are moist.  ?Eyes:  ?   Extraocular Movements: Extraocular movements intact.  ?   Pupils: Pupils are equal, round, and reactive to light.  ?Cardiovascular:  ?   Rate and Rhythm: Normal rate and regular rhythm.  ?   Pulses: Normal pulses.  ?   Heart sounds: Normal heart sounds.  ?Pulmonary:  ?   Effort: Pulmonary effort is normal.  ?   Breath sounds: Normal breath sounds.  ?Abdominal:  ?   General: Bowel sounds are normal.  ?   Palpations: Abdomen is soft.  ?   Tenderness: There is no abdominal tenderness.  ?Musculoskeletal:     ?   General: No swelling.  ?   Right lower leg: No edema.  ?   Left lower leg: No edema.  ?Lymphadenopathy:  ?   Cervical: No cervical adenopathy.  ?Skin: ?   General: Skin is warm and dry.  ?   Findings: Bruising present.  ?Neurological:  ?   General: No focal deficit present.  ?   Mental Status: She is alert and oriented to person, place, and time.  ?Psychiatric:     ?   Mood and Affect: Mood normal.     ?   Behavior: Behavior normal.  ? ? ? ?LABORATORY DATA:  ?I have reviewed the labs as listed.   ?CBC ?   ?Component Value Date/Time  ? WBC 4.3 06/25/2021 1446  ? RBC 3.35 (L) 06/25/2021 1446  ? HGB 11.0 (L) 06/25/2021 1446  ? HCT 34.7 (L) 06/25/2021 1446  ? PLT 126 (L) 06/25/2021 1446  ? MCV 103.6 (H) 06/25/2021 1446

## 2021-07-23 ENCOUNTER — Inpatient Hospital Stay (HOSPITAL_COMMUNITY): Payer: Medicare Other | Attending: Hematology | Admitting: Physician Assistant

## 2021-07-23 VITALS — BP 123/50 | HR 60 | Temp 97.8°F | Resp 18 | Ht 67.0 in | Wt 217.0 lb

## 2021-07-23 DIAGNOSIS — D508 Other iron deficiency anemias: Secondary | ICD-10-CM

## 2021-07-23 DIAGNOSIS — R791 Abnormal coagulation profile: Secondary | ICD-10-CM | POA: Insufficient documentation

## 2021-07-23 DIAGNOSIS — Z7901 Long term (current) use of anticoagulants: Secondary | ICD-10-CM | POA: Diagnosis not present

## 2021-07-23 DIAGNOSIS — D696 Thrombocytopenia, unspecified: Secondary | ICD-10-CM | POA: Diagnosis not present

## 2021-07-23 NOTE — Patient Instructions (Signed)
[place] at [facility] ?Discharge Instructions ? ?You were seen today by Tarri Abernethy PA-C for your follow-up visit.   ? ?WARFARIN: Your unique genetic (DNA) makeup predisposes you to have increased warfarin sensitivity.  This means that you may require lower doses of warfarin then most people.  You should avoid other medications that would increase your sensitivity to warfarin (such as allopurinol). ? ?LOW PLATELETS: ?- This may be related to your underlying fatty liver disease. ?- Your vitamin B-12 levels were slightly low, so I recommend that you start taking VITAMIN B-12 SUPPLEMENT 500 mcg DAILY. ? ?FOLLOW-UP APPOINTMENT: Repeat labs in 6 months, with phone visit the following week. ?------------------- ? ?Thank you for choosing [place] at [facility] to provide your oncology and hematology care.  To afford each patient quality time with our provider, please arrive at least 15 minutes before your scheduled appointment time.  ? ?If you have a lab appointment with the [facility] please come in thru the Main Entrance and check in at the main information desk. ? ?You need to re-schedule your appointment should you arrive 10 or more minutes late.  We strive to give you quality time with our providers, and arriving late affects you and other patients whose appointments are after yours.  Also, if you no show three or more times for appointments you may be dismissed from the clinic at the providers discretion.     ?Again, thank you for choosing [facility]  Our hope is that these requests will decrease the amount of time that you wait before being seen by our physicians.       ?_____________________________________________________________ ? ?Should you have questions after your visit to [facility], please contact our office at [phone] and follow the prompts.  Our office hours are 8:00 a.m. and 4:30 p.m. Monday - Friday.   Please note that voicemails left after 4:00 p.m. may not be returned until the following business day.  We are closed weekends and major holidays.  You do have access to a nurse 24-7, just call the main number to the clinic [phone] and do not press any options, hold on the line and a nurse will answer the phone.   ? ?For prescription refill requests, have your pharmacy contact our office and allow 72 hours.   ? ?Due to Covid, you will need to wear a mask upon entering the hospital. If you do not have a mask, a mask will be given to you at the Main Entrance upon arrival. For doctor visits, patients may have 1 support person age 77 or older with them. For treatment visits, patients can not have anyone with them due to social distancing guidelines and our immunocompromised population.  ? ? ? ?

## 2021-08-14 ENCOUNTER — Ambulatory Visit (HOSPITAL_COMMUNITY)
Admission: RE | Admit: 2021-08-14 | Discharge: 2021-08-14 | Disposition: A | Discharge status: Home / Self Care | Payer: Medicare Other | Source: Ambulatory Visit | Attending: Neurological Surgery | Admitting: Neurological Surgery

## 2021-08-14 DIAGNOSIS — G959 Disease of spinal cord, unspecified: Secondary | ICD-10-CM | POA: Insufficient documentation

## 2021-08-14 DIAGNOSIS — M5416 Radiculopathy, lumbar region: Secondary | ICD-10-CM | POA: Diagnosis not present

## 2021-08-14 IMAGING — MR MR LUMBAR SPINE W/O CM
4 of 5 series · 29 of 48 positions shown · non-contrast
Comparison: None Available.

CLINICAL DATA: Lumbar radiculopathy

EXAM:
MRI LUMBAR SPINE WITHOUT CONTRAST
TECHNIQUE: Multiplanar, multisequence MR imaging of the lumbar spine was
performed. No intravenous contrast was administered.

[Series 11: T2 · sagittal · 4.0mm · 0.68mm/px · 6 of 16 slices shown (1 of 2)]
[im 1/16]
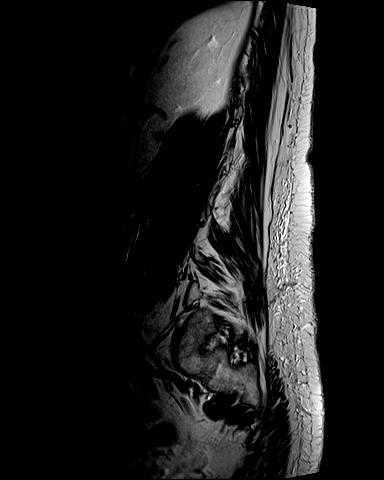
[im 4/16]
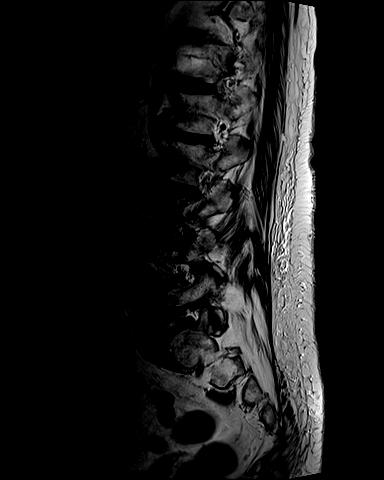
[im 7/16]
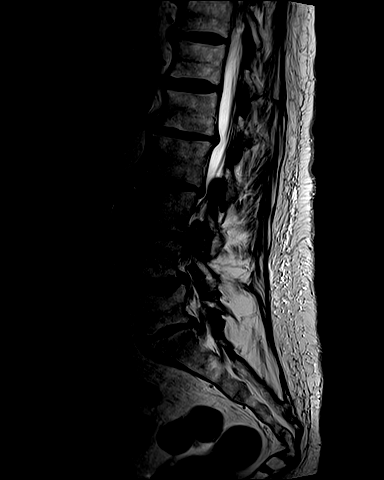
[im 10/16]
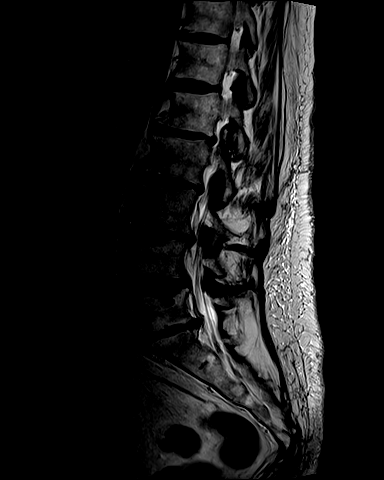
[im 13/16]
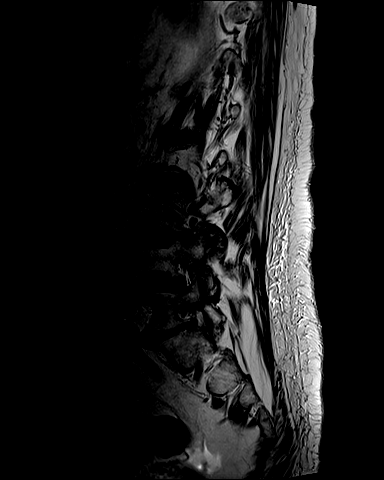
[im 16/16]
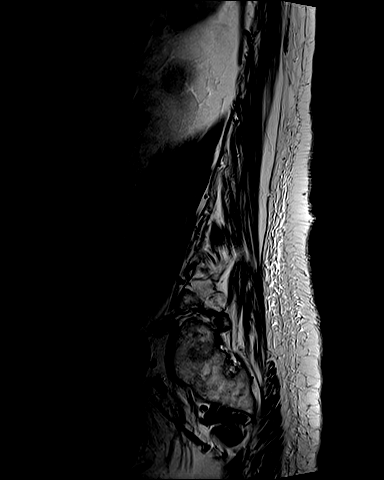

[Series 12: T1 · sagittal · 4.0mm · 0.81mm/px · 7 of 16 slices shown (1 of 2)]
[im 1/16]
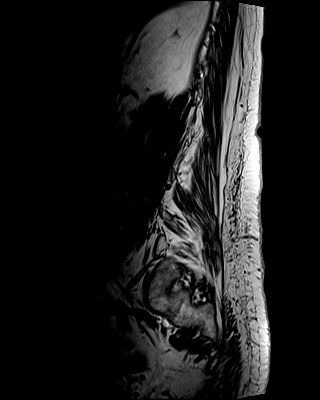
[im 3/16]
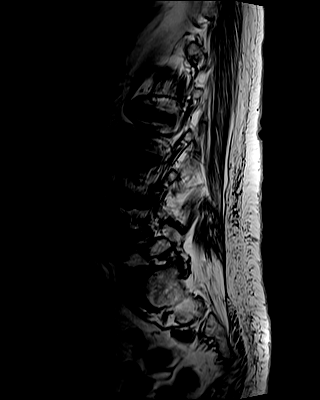
[im 6/16]
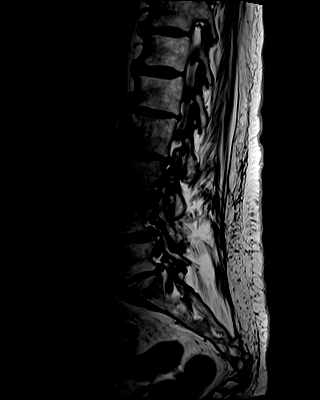
[im 8/16]
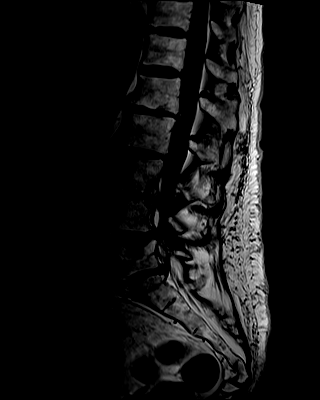
[im 11/16]
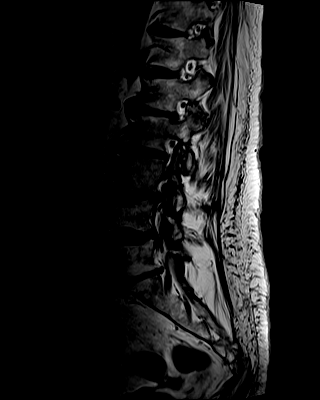
[im 13/16]
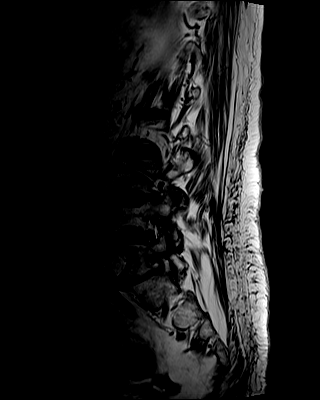
[im 16/16]
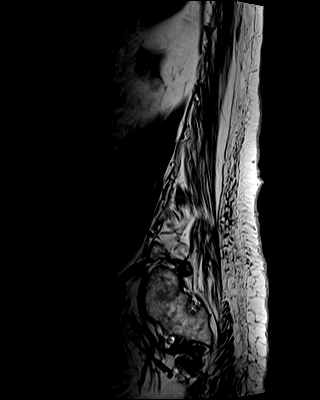

[Series 14: T2 · axial · 4.0mm · 0.70mm/px · z∈[-501,-327]mm · 8 of 31 slices shown (2 of 2)]
[im 1/31]
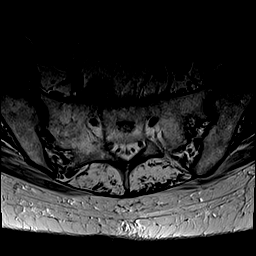
[im 5/31]
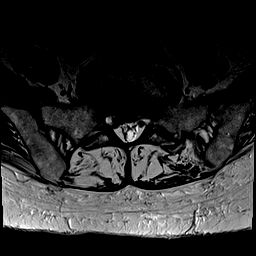
[im 10/31]
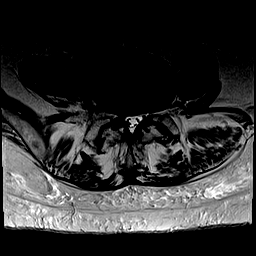
[im 14/31]
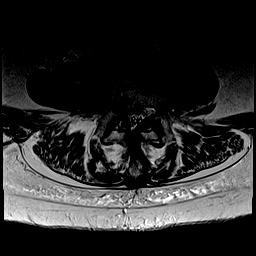
[im 17/31]
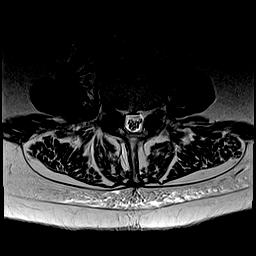
[im 21/31]
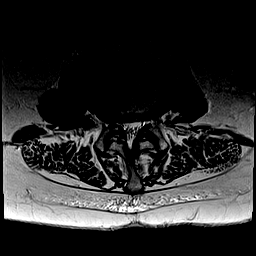
[im 26/31]
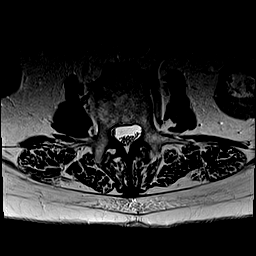
[im 31/31]
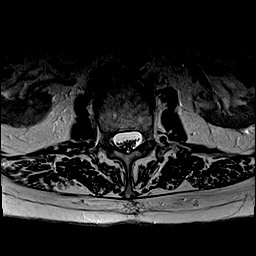

[Series 15: T1 · axial · 4.0mm · 0.35mm/px · z∈[-501,-327]mm · 8 of 31 slices shown (2 of 2)]
[im 1/31]
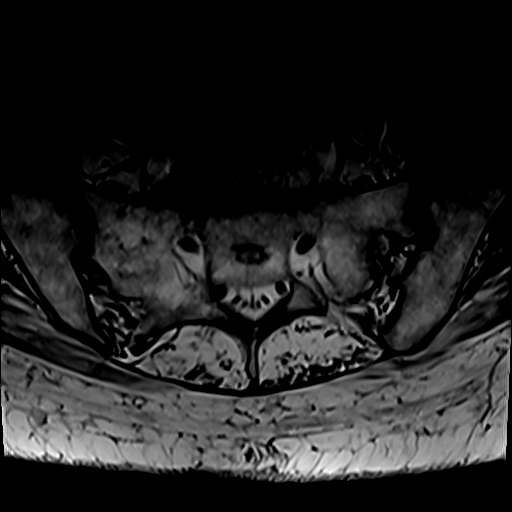
[im 5/31]
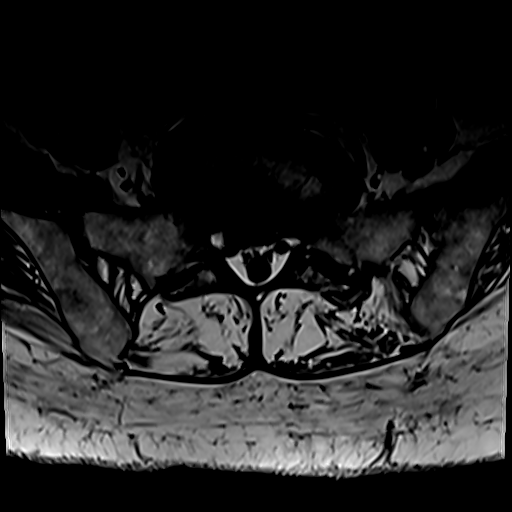
[im 10/31]
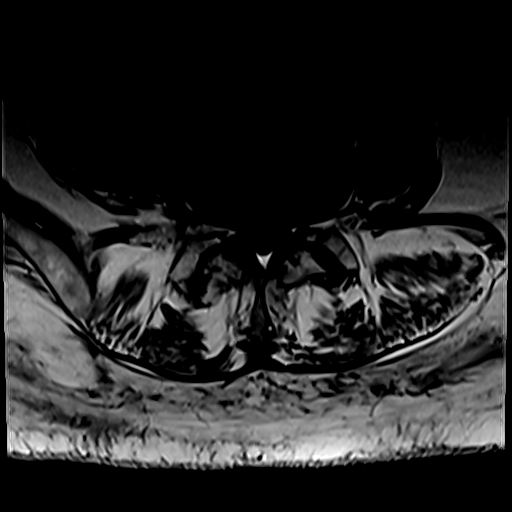
[im 14/31]
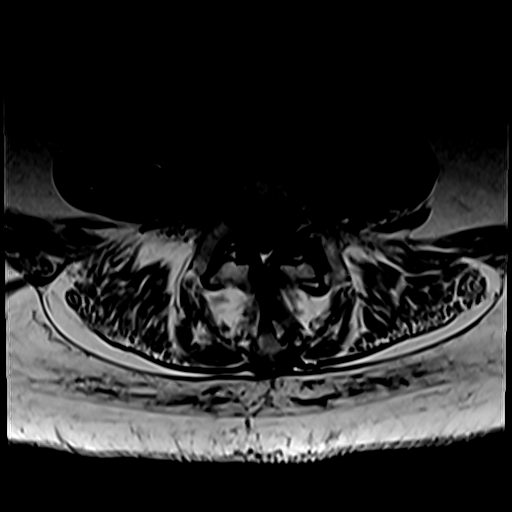
[im 17/31]
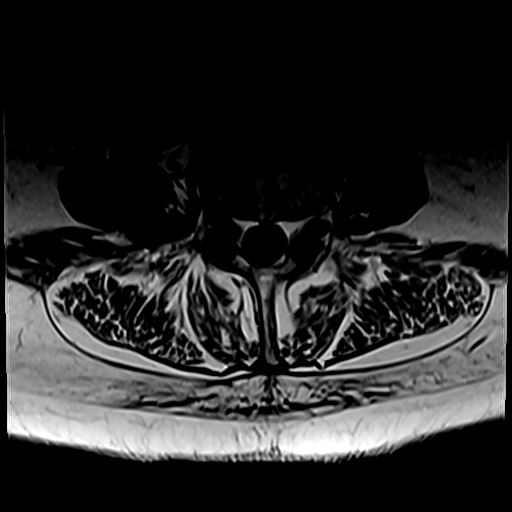
[im 21/31]
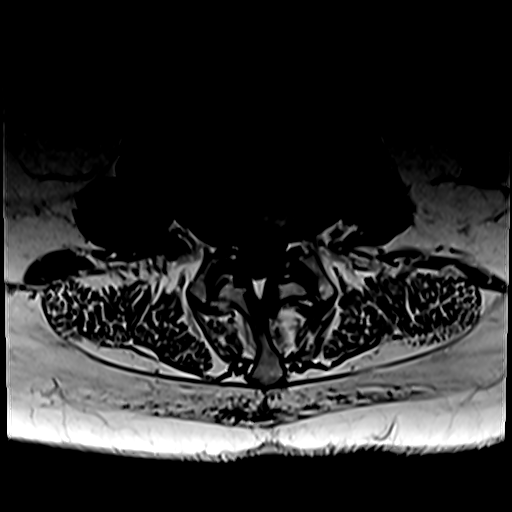
[im 26/31]
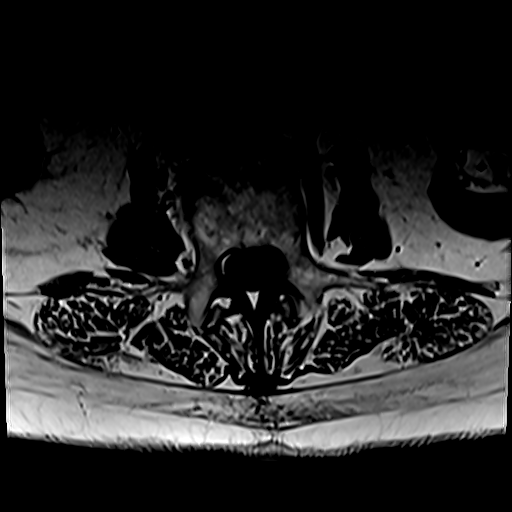
[im 31/31]
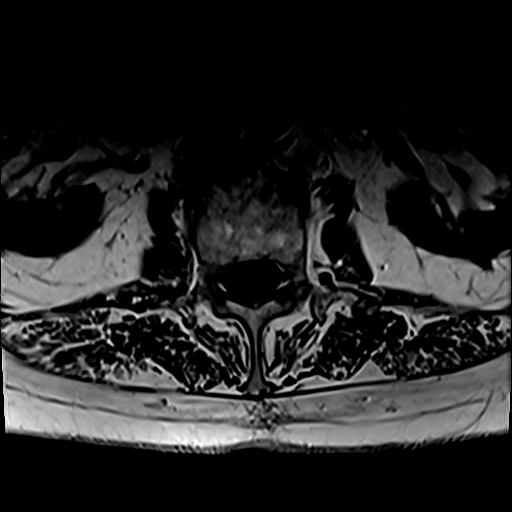

[29 of 48 positions shown; findings below may reference images not displayed]

FINDINGS: Segmentation:  Standard.

Alignment:  Grade 1 retrolisthesis at L2-3 and L3-4.

Vertebrae: Large Schmorl's node at superior endplate of L5. No acute
lesion.

Conus medullaris and cauda equina: Conus extends to the L1 level.
Conus and cauda equina appear normal.

Paraspinal and other soft tissues: Negative.

Disc levels:

L1-L2: Left asymmetric disc bulge. No spinal canal stenosis. No
neural foraminal stenosis.

L2-L3: Right asymmetric disc bulge. Right lateral recess narrowing
without central spinal canal stenosis. No neural foraminal stenosis.

L3-L4: Small disc bulge with endplate spurring and moderate facet
hypertrophy. Unchanged moderate spinal canal stenosis. Unchanged
moderate right and mild left neural foraminal stenosis.

L4-L5: Unchanged right asymmetric disc bulge and moderate facet
hypertrophy. Right lateral recess narrowing without central spinal
canal stenosis. Unchanged mild bilateral neural foraminal stenosis.

L5-S1: Small disc bulge with endplate spurring. No spinal canal
stenosis. Unchanged mild right neural foraminal stenosis.

Visualized sacrum: Normal.
IMPRESSION: 1. Unchanged moderate L3-4 spinal canal stenosis with moderate right
and mild left neural foraminal stenosis.
2. Unchanged mild bilateral L4-5 and right L5-S1 neural foraminal
stenosis.

## 2021-08-14 IMAGING — MR MR CERVICAL SPINE W/O CM
6 of 7 series · 32 of 48 positions shown · non-contrast
Comparison: [DATE]

CLINICAL DATA: Cervical myelopathy

EXAM:
MRI CERVICAL SPINE WITHOUT CONTRAST
TECHNIQUE: Multiplanar, multisequence MR imaging of the cervical spine was
performed. No intravenous contrast was administered.

[Series 5: T2 · sagittal · 3.0mm · 0.69mm/px · 4 of 15 slices shown (1 of 2)]
[im 1/15]
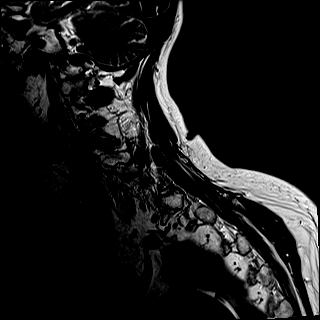
[im 5/15]
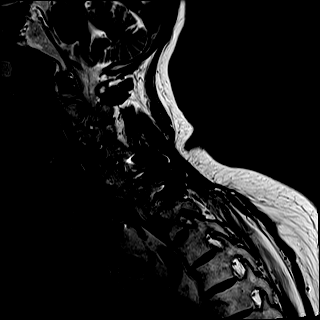
[im 10/15]
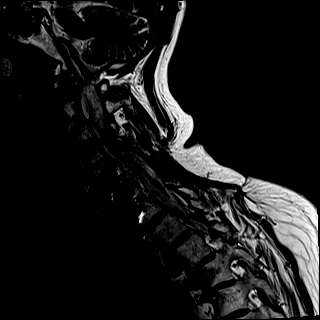
[im 15/15]
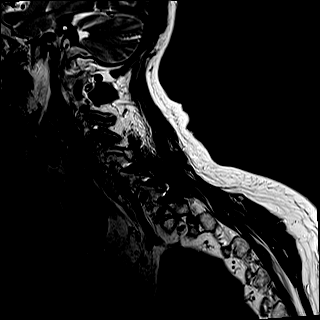

[Series 6: T1 · sagittal · 3.0mm · 0.86mm/px · 4 of 15 slices shown]
[im 1/15]
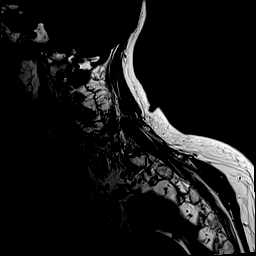
[im 5/15]
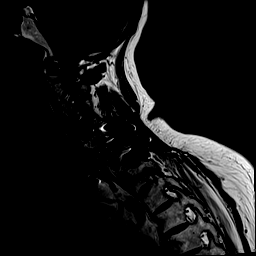
[im 10/15]
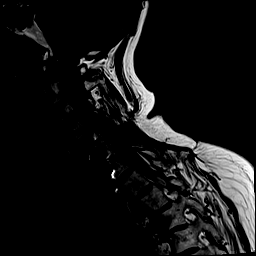
[im 15/15]
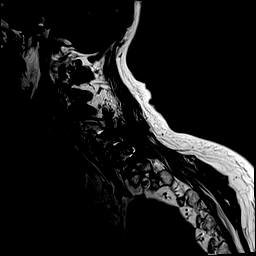

[Series 7: STIR · sagittal · 3.0mm · 0.69mm/px · 4 of 15 slices shown]
[im 1/15]
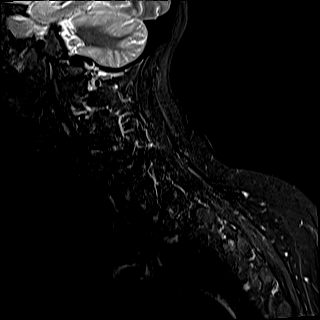
[im 5/15]
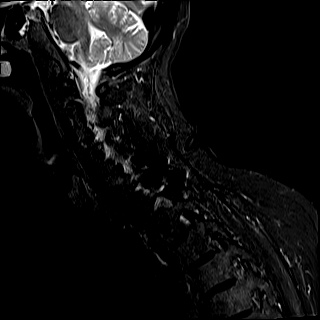
[im 10/15]
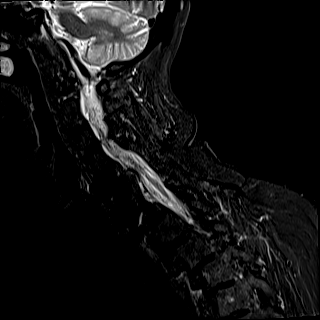
[im 15/15]
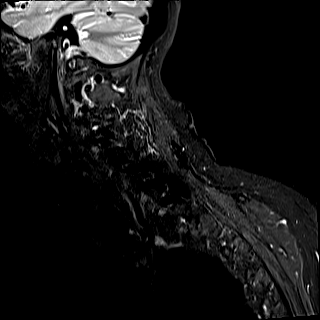

[Series 8: T2 · axial · 3.0mm · 0.70mm/px · z∈[-82,+4]mm · 9 of 31 slices shown (2 of 2)]
[im 1/31]
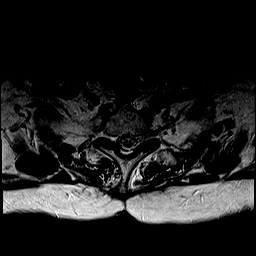
[im 4/31]
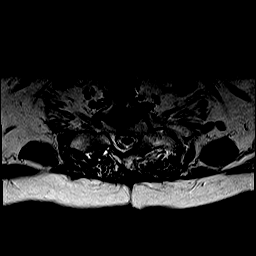
[im 8/31]
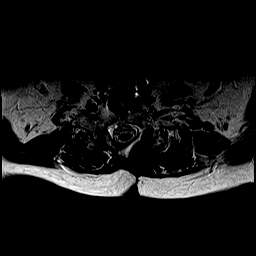
[im 12/31]
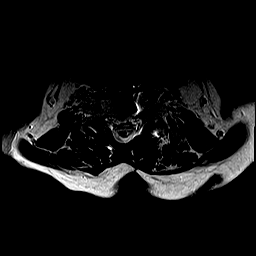
[im 16/31]
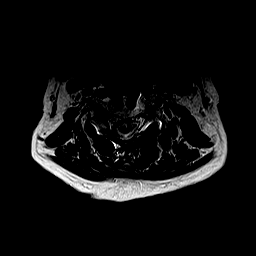
[im 19/31]
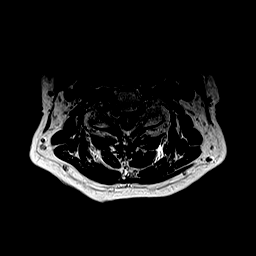
[im 23/31]
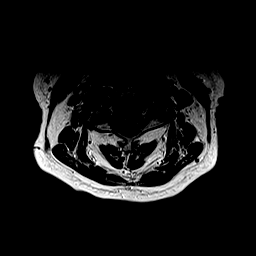
[im 27/31]
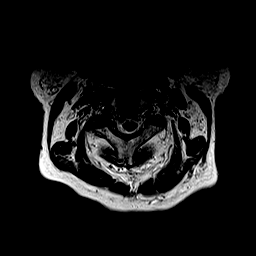
[im 31/31]
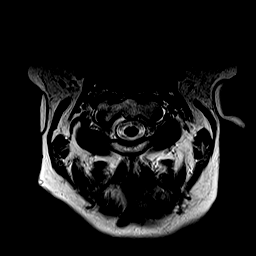

[Series 9: GRE · axial · 3.0mm · 0.35mm/px · z∈[-82,+4]mm · 8 of 31 slices shown]
[im 1/31]
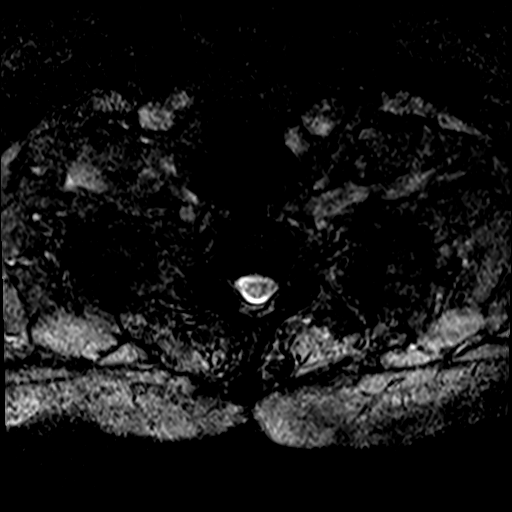
[im 4/31]
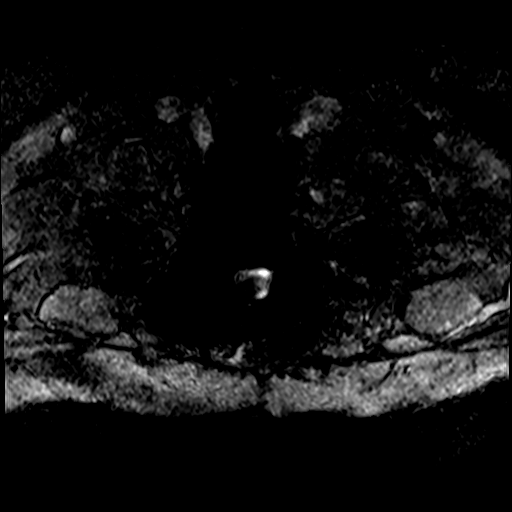
[im 8/31]
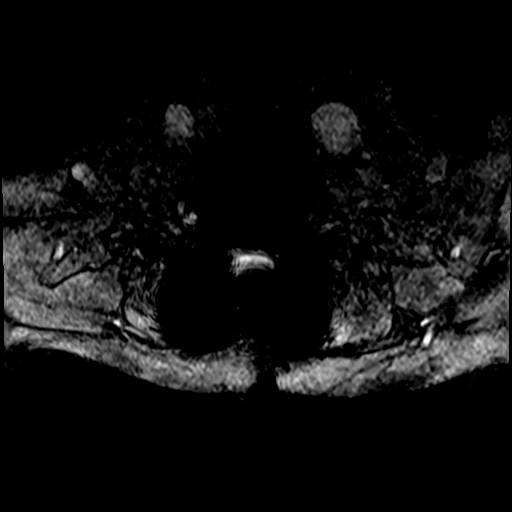
[im 12/31]
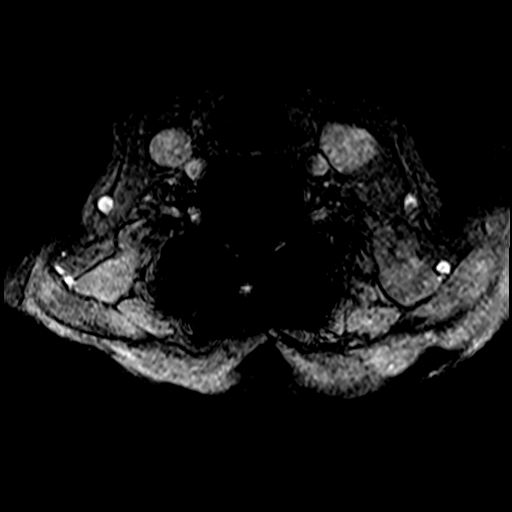
[im 19/31]
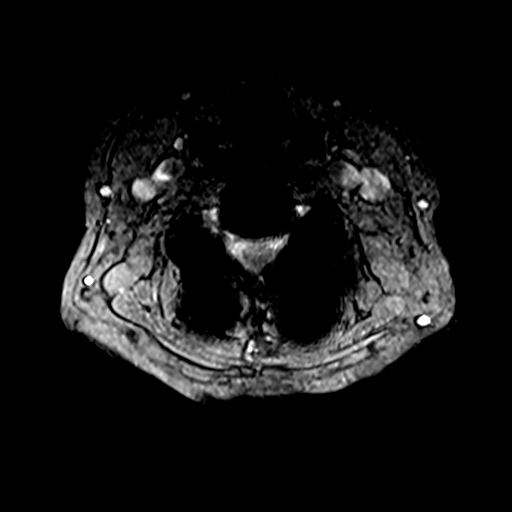
[im 23/31]
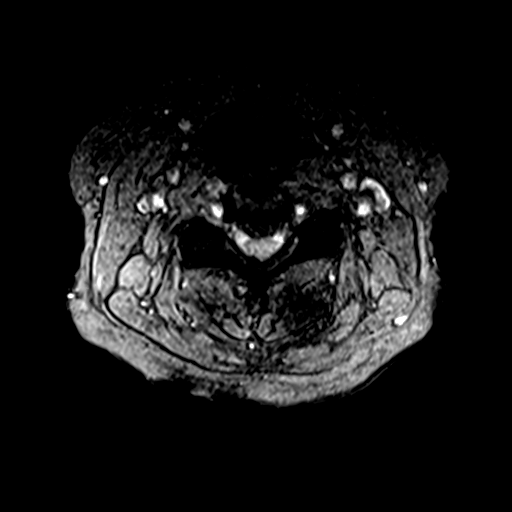
[im 27/31]
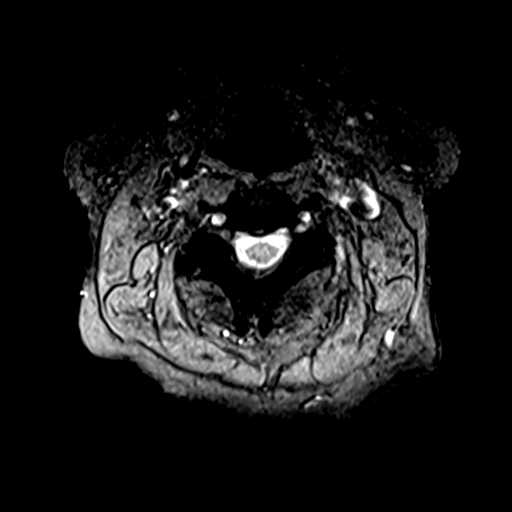
[im 31/31]
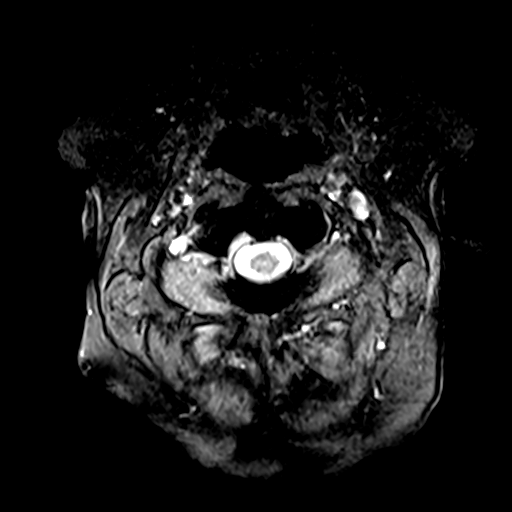

[Series 100: t1_tse_warp_tra · axial · 3.0mm · 0.39mm/px · z∈[-87,-67]mm · 3 of 31 slices shown]
[im 1/31]
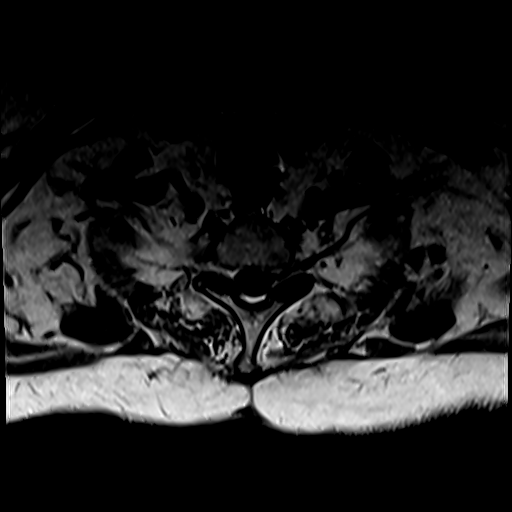
[im 4/31]
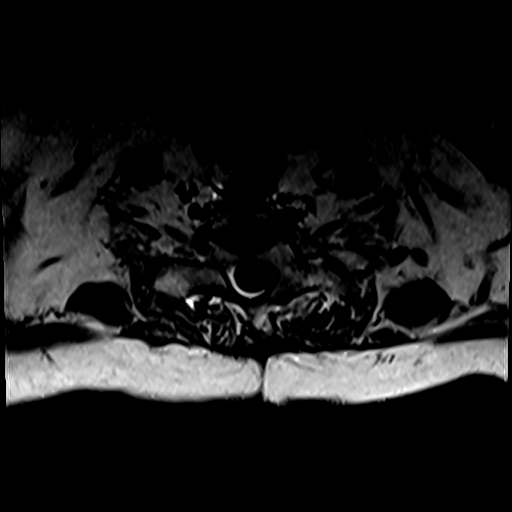
[im 8/31]
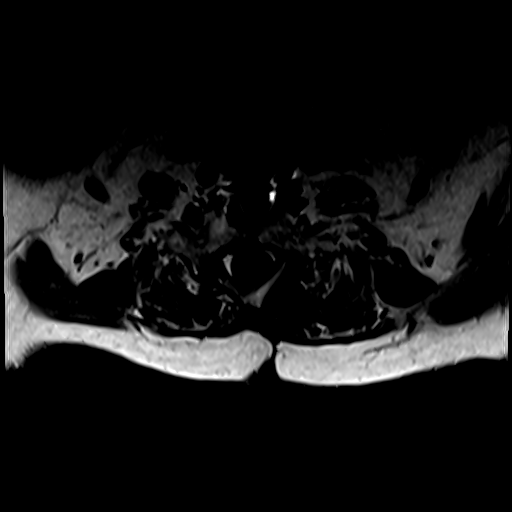

[32 of 48 positions shown; findings below may reference images not displayed]

FINDINGS: Alignment: Grade 1 retrolisthesis at C3-4 and grade 1
anterolisthesis at C4-5

Vertebrae: C5-7 anterior and posterior fusion

Cord: Myelomalacia at the C5-6 level.

Posterior Fossa, vertebral arteries, paraspinal tissues: Negative

Disc levels:

C1-2: Unremarkable.

C2-3: Normal disc space and facet joints. There is no spinal canal
stenosis. No neural foraminal stenosis.

C3-4: Slightly worsened small central disc protrusion and ligamentum
flavum infolding. Moderate spinal canal stenosis. No neural
foraminal stenosis.

C4-5: Normal disc space and facet joints. There is no spinal canal
stenosis. No neural foraminal stenosis.

C5-6: Anterior and posterior fusion with posterior decompression.
There is no spinal canal stenosis. No neural foraminal stenosis.

C6-7: Posterior decompression with anterior and posterior fusion.
There is no spinal canal stenosis. No neural foraminal stenosis.

C7-T1: Normal disc space and facet joints. There is no spinal canal
stenosis. No neural foraminal stenosis.
IMPRESSION: 1. Slightly worsened central disc protrusion and ligamentum flavum
infolding at C3-4 with moderate spinal canal stenosis.
2. C5-7 anterior and posterior fusion with myelomalacia at the C5-6
level.
3. No neural foraminal stenosis.

## 2021-10-10 ENCOUNTER — Other Ambulatory Visit: Payer: Self-pay | Admitting: Neurological Surgery

## 2021-10-21 NOTE — Pre-Procedure Instructions (Signed)
Surgical Instructions    Your procedure is scheduled on Tuesday, July 18th.  Report to [facility] Main Entrance "A" at 09:30 A.M., then check in with the Admitting office.  Call this number if you have problems the morning of surgery:  [phone]   If you have any questions prior to your surgery date call [phone]: Open Monday-Friday 8am-4pm    Remember:  Do not eat after midnight the night before your surgery  You may drink clear liquids until 08:30 AM the morning of your surgery.   Clear liquids allowed are: Water, Non-Citrus Juices (without pulp), Carbonated Beverages, Clear Tea, Black Coffee Only (NO MILK, CREAM OR POWDERED CREAMER of any kind), and Gatorade.    Take these medicines the morning of surgery with A SIP OF WATER  febuxostat (ULORIC) nebivolol (BYSTOLIC)  omeprazole (PRILOSEC) sertraline (ZOLOFT)    Hold Coumadin 4 days prior to surgery, per PCP.  As of today, STOP taking any Aspirin (unless otherwise instructed by your surgeon) Aleve, Naproxen, Ibuprofen, Motrin, Advil, Goody's, BC's, all herbal medications, fish oil, and all vitamins.                     Do NOT Smoke (Tobacco/Vaping) for 24 hours prior to your procedure.  If you use a CPAP at night, you may bring your mask/headgear for your overnight stay.   Contacts, glasses, piercing's, hearing aid's, dentures or partials may not be worn into surgery, please bring cases for these belongings.    For patients admitted to the hospital, discharge time will be determined by your treatment team.   Patients discharged the day of surgery will not be allowed to drive home, and someone needs to stay with them for 24 hours.  SURGICAL WAITING ROOM VISITATION Patients having surgery or a procedure may have two support people in the waiting room. These visitors may be switched out with other visitors if needed. Children under the age of 54 must have an adult accompany them who is not the patient. If the patient  needs to stay at the hospital during part of their recovery, the visitor guidelines for inpatient rooms apply.  Please refer to the [facility] website for the visitor guidelines for Inpatients (after your surgery is over and you are in a regular room).    Special instructions:   [place]- Preparing For Surgery  Before surgery, you can play an important role. Because skin is not sterile, your skin needs to be as free of germs as possible. You can reduce the number of germs on your skin by washing with CHG (chlorahexidine gluconate) Soap before surgery.  CHG is an antiseptic cleaner which kills germs and bonds with the skin to continue killing germs even after washing.    Oral Hygiene is also important to reduce your risk of infection.  Remember - BRUSH YOUR TEETH THE MORNING OF SURGERY WITH YOUR REGULAR TOOTHPASTE  Please do not use if you have an allergy to CHG or antibacterial soaps. If your skin becomes reddened/irritated stop using the CHG.  Do not shave (including legs and underarms) for at least 48 hours prior to first CHG shower. It is OK to shave your face.  Please follow these instructions carefully.   Shower the NIGHT BEFORE SURGERY and the MORNING OF SURGERY  If you chose to wash your hair, wash your hair first as usual with your normal shampoo.  After you shampoo, rinse your hair and body thoroughly to remove the shampoo.  Use  CHG Soap as you would any other liquid soap. You can apply CHG directly to the skin and wash gently with a scrungie or a clean washcloth.   Apply the CHG Soap to your body ONLY FROM THE NECK DOWN.  Do not use on open wounds or open sores. Avoid contact with your eyes, ears, mouth and genitals (private parts). Wash Face and genitals (private parts)  with your normal soap.   Wash thoroughly, paying special attention to the area where your surgery will be performed.  Thoroughly rinse your body with warm water from the neck down.  DO NOT shower/wash  with your normal soap after using and rinsing off the CHG Soap.  Pat yourself dry with a CLEAN TOWEL.  Wear CLEAN PAJAMAS to bed the night before surgery  Place CLEAN SHEETS on your bed the night before your surgery  DO NOT SLEEP WITH PETS.   Day of Surgery: Take a shower with CHG soap. Do not wear jewelry  Do not wear lotions, powders, colognes, or deodorant.  Men may shave face and neck. Do not bring valuables to the hospital. [facility] is not responsible for any belongings or valuables.  Wear Clean/Comfortable clothing the morning of surgery Remember to brush your teeth WITH YOUR REGULAR TOOTHPASTE.   Please read over the following fact sheets that you were given.    If you received a COVID test during your pre-op visit  it is requested that you wear a mask when out in public, stay away from anyone that may not be feeling well and notify your surgeon if you develop symptoms. If you have been in contact with anyone that has tested positive in the last 10 days please notify you surgeon.

## 2021-10-22 ENCOUNTER — Encounter (HOSPITAL_COMMUNITY): Payer: Self-pay

## 2021-10-22 ENCOUNTER — Encounter (HOSPITAL_COMMUNITY)
Admission: RE | Admit: 2021-10-22 | Discharge: 2021-10-22 | Disposition: A | Discharge status: Home / Self Care | Payer: Medicare Other | Source: Ambulatory Visit | Attending: Neurological Surgery | Admitting: Neurological Surgery

## 2021-10-22 ENCOUNTER — Other Ambulatory Visit: Payer: Self-pay

## 2021-10-22 VITALS — BP 141/96 | HR 57 | Temp 97.6°F | Resp 18 | Ht 67.0 in | Wt 215.0 lb

## 2021-10-22 DIAGNOSIS — M48061 Spinal stenosis, lumbar region without neurogenic claudication: Secondary | ICD-10-CM | POA: Insufficient documentation

## 2021-10-22 DIAGNOSIS — Z981 Arthrodesis status: Secondary | ICD-10-CM | POA: Diagnosis not present

## 2021-10-22 DIAGNOSIS — I5032 Chronic diastolic (congestive) heart failure: Secondary | ICD-10-CM | POA: Diagnosis not present

## 2021-10-22 DIAGNOSIS — G4733 Obstructive sleep apnea (adult) (pediatric): Secondary | ICD-10-CM | POA: Insufficient documentation

## 2021-10-22 DIAGNOSIS — K219 Gastro-esophageal reflux disease without esophagitis: Secondary | ICD-10-CM | POA: Insufficient documentation

## 2021-10-22 DIAGNOSIS — Z923 Personal history of irradiation: Secondary | ICD-10-CM | POA: Insufficient documentation

## 2021-10-22 DIAGNOSIS — D649 Anemia, unspecified: Secondary | ICD-10-CM | POA: Diagnosis not present

## 2021-10-22 DIAGNOSIS — G9589 Other specified diseases of spinal cord: Secondary | ICD-10-CM | POA: Insufficient documentation

## 2021-10-22 DIAGNOSIS — Z853 Personal history of malignant neoplasm of breast: Secondary | ICD-10-CM | POA: Insufficient documentation

## 2021-10-22 DIAGNOSIS — Z01812 Encounter for preprocedural laboratory examination: Secondary | ICD-10-CM | POA: Insufficient documentation

## 2021-10-22 DIAGNOSIS — I110 Hypertensive heart disease with heart failure: Secondary | ICD-10-CM | POA: Insufficient documentation

## 2021-10-22 DIAGNOSIS — Z01818 Encounter for other preprocedural examination: Secondary | ICD-10-CM

## 2021-10-22 DIAGNOSIS — I2510 Atherosclerotic heart disease of native coronary artery without angina pectoris: Secondary | ICD-10-CM | POA: Diagnosis not present

## 2021-10-22 HISTORY — DX: Atherosclerotic heart disease of native coronary artery without angina pectoris: I25.10

## 2021-10-22 HISTORY — DX: Anemia, unspecified: D64.9

## 2021-10-22 LAB — CBC
HCT: 38.5 % (ref 36.0–46.0)
Hemoglobin: 12.4 g/dL (ref 12.0–15.0)
MCH: 31.8 pg (ref 26.0–34.0)
MCHC: 32.2 g/dL (ref 30.0–36.0)
MCV: 98.7 fL (ref 80.0–100.0)
Platelets: 143 10*3/uL — ABNORMAL LOW (ref 150–400)
RBC: 3.9 MIL/uL (ref 3.87–5.11)
RDW: 13.4 % (ref 11.5–15.5)
WBC: 6.4 10*3/uL (ref 4.0–10.5)
nRBC: 0 % (ref 0.0–0.2)

## 2021-10-22 LAB — BASIC METABOLIC PANEL
Anion gap: 8 (ref 5–15)
BUN: 35 mg/dL — ABNORMAL HIGH (ref 8–23)
CO2: 28 mmol/L (ref 22–32)
Calcium: 9.1 mg/dL (ref 8.9–10.3)
Chloride: 104 mmol/L (ref 98–111)
Creatinine, Ser: 0.96 mg/dL (ref 0.44–1.00)
GFR, Estimated: >60 mL/min (ref >60)
Glucose, Bld: 106 mg/dL — ABNORMAL HIGH (ref 70–99)
Potassium: 4.2 mmol/L (ref 3.5–5.1)
Sodium: 140 mmol/L (ref 135–145)

## 2021-10-22 LAB — TYPE AND SCREEN
ABO/RH(D): A NEG
Antibody Screen: NEGATIVE

## 2021-10-22 LAB — SURGICAL PCR SCREEN
MRSA, PCR: NEGATIVE
Staphylococcus aureus: NEGATIVE

## 2021-10-22 NOTE — Progress Notes (Addendum)
PCP - Dr. Moshe Cipro, MD Cardiologist - Dr. Vella [facility]  PPM/ICD - n/a  Chest x-ray - n/a EKG - requested from [facility] and Vascular Stress Test - 07/14/17 ECHO - 07/15/13 Cardiac Cath - 01/02/15  ST, ECHO, and Cath report found in MEDIA tab-correspondence dated 04/01/19.  Sleep Study - CPAP CPAP - uses nightly.  Blood Thinner Instructions: Coumadin, hold for 4 days. LD-10/24/21 Aspirin Instructions: n/a  ERAS Protcol - Clear liquids until 0830 DOS PRE-SURGERY Ensure or G2- none ordered  COVID TEST- n/a  Anesthesia review: Yes, requested EKG tracing from [facility] and Vascular.  Patient denies shortness of breath, fever, cough and chest pain at PAT appointment   All instructions explained to the patient, with a verbal understanding of the material. Patient agrees to go over the instructions while at home for a better understanding. Patient also instructed to self quarantine after being tested for COVID-19. The opportunity to ask questions was provided.

## 2021-10-23 ENCOUNTER — Encounter (HOSPITAL_COMMUNITY): Payer: Self-pay

## 2021-10-23 NOTE — Anesthesia Preprocedure Evaluation (Addendum)
Anesthesia Evaluation  Patient identified by MRN, date of birth, ID band Patient awake    Reviewed: Allergy & Precautions, H&P , NPO status , Patient's Chart, lab work & pertinent test results, reviewed documented beta blocker date and time   Airway Mallampati: I  TM Distance: >3 FB Neck ROM: Full    Dental no notable dental hx. (+) Teeth Intact, Dental Advisory Given   Pulmonary sleep apnea and Continuous Positive Airway Pressure Ventilation ,    Pulmonary exam normal breath sounds clear to auscultation       Cardiovascular hypertension, Pt. on medications and Pt. on home beta blockers + CAD and + Cardiac Stents   Rhythm:Regular Rate:Normal     Neuro/Psych Anxiety negative neurological ROS     GI/Hepatic Neg liver ROS, GERD  Medicated,  Endo/Other  negative endocrine ROS  Renal/GU negative Renal ROS  negative genitourinary   Musculoskeletal  (+) Arthritis , Osteoarthritis,    Abdominal   Peds  Hematology  (+) Blood dyscrasia, anemia ,   Anesthesia Other Findings   Reproductive/Obstetrics negative OB ROS                           Anesthesia Physical Anesthesia Plan  ASA: 3  Anesthesia Plan: General   Post-op Pain Management: Tylenol PO (pre-op)*   Induction: Intravenous  PONV Risk Score and Plan: 4 or greater and Ondansetron, Dexamethasone and Treatment may vary due to age or medical condition  Airway Management Planned: Oral ETT  Additional Equipment:   Intra-op Plan:   Post-operative Plan: Extubation in OR  Informed Consent: I have reviewed the patients History and Physical, chart, labs and discussed the procedure including the risks, benefits and alternatives for the proposed anesthesia with the patient or authorized representative who has indicated his/her understanding and acceptance.     Dental advisory given  Plan Discussed with: CRNA  Anesthesia Plan Comments:  (PAT note written 10/23/2021 by Myra Gianotti, PA-C. )       Anesthesia Quick Evaluation

## 2021-10-23 NOTE — Progress Notes (Signed)
Anesthesia Chart Review:  Case: [id number] Date/Time: 10/29/21 1116   Procedure: C3, C4 Laminectomies with C3-C5 posterior instrumented fusion - RM 20/ To follow   Anesthesia type: General   Pre-op diagnosis: Cervical myelopathy   Location: [facility] OR ROOM 22 / [place] [facility]   Surgeons: Judith Part, MD       DISCUSSION: Patient is a 81 year old female scheduled for the above procedure.  History includes never smoker, HTN, CAD (DES pLAD 01/02/15, patent stent 12/10/20), atrial fibrillation (diagnosed ~ 2012), chronic diastolic CHF, GERD, anemia, OSA (uses CPAP), right breast cancer (lumpectomy 08/14/14, 10/05/16, radiation 2016), right TKR (10/18/13), spinal surgery (C5-7 anterior fusion 9/7/353 complicated by "quadriplegia", improved; C5-7 posterior laminectomy/fusion 03/29/19, readmitted with small subdural/intradural collection with moderate compression, s/p IV Decadron, CIR rehab), venous insufficiency, hysterectomy (w/BSO 01/27/12), obesity.   Primary cardiologist is Dr. Gibson Ramp. Preoperative cardiology input obtained from Cleora Fleet, MD at [facility] in [place]. On 10/16/21, he wrote, "This is to inform you that the above-mentioned patient is followed by me and has been cleared for her upcoming surgery for C3, C4 laminectomy/C3-5 Posterior Instrumented fusion, under general anesthesia.  She was instructed to hold her Coumadin 4 days prior to the surgery with PT/INR 24 hours prior to preop..." Usual INR goal 2.0-3.0.   Reported last warfarin scheduled for 10/24/2021.  She is for PT/INR on arrival.  Anesthesia team to evaluate on the day of surgery.   VS: BP (!) 141/96   Pulse (!) 57   Temp 36.4 C   Resp 18   Ht '5\' 7"'$  (1.702 m)   Wt 97.5 kg   LMP  (LMP Unknown)   SpO2 99%   BMI 33.67 kg/m    PROVIDERS: Moshe Cipro, MD is PCP  Carrolyn Leigh, MD is cardiologist ([place])   LABS: Labs reviewed: Acceptable for surgery. Needs PT/INR on arrival.  (all  labs ordered are listed, but only abnormal results are displayed)  Labs Reviewed  BASIC METABOLIC PANEL - Abnormal; Notable for the following components:      Result Value   Glucose, Bld 106 (*)    BUN 35 (*)    All other components within normal limits  CBC - Abnormal; Notable for the following components:   Platelets 143 (*)    All other components within normal limits  SURGICAL PCR SCREEN  TYPE AND SCREEN    IMAGES: MRI C-spine 08/14/21: IMPRESSION: 1. Slightly worsened central disc protrusion and ligamentum flavum infolding at C3-4 with moderate spinal canal stenosis. 2. C5-7 anterior and posterior fusion with myelomalacia at the C5-6 level. 3. No neural foraminal stenosis.   MRI L-spine 08/14/21: IMPRESSION: 1. Unchanged moderate L3-4 spinal canal stenosis with moderate right and mild left neural foraminal stenosis. 2. Unchanged mild bilateral L4-5 and right L5-S1 neural foraminal stenosis.    EKG: 07/04/21 ([facility]): Sinus rhythm.  Low voltage in precordial leads.  Poor R wave progression, may be secondary to pulmonary disease, consider old anterior infarct.   CV: The following studies are as outlined in 10/01/21 [facility] note: (Will attempt to get copies from [facility].)  RHC/LHC 12/10/20: 1.  LMCA: Normal  2.  LAD: Patent stent in the proximal vessel no obstructive disease 3.  CX: Normal 4.  RCA: 40% proximal, 40% mid 5.  LVEDP 12 mmHg 6.  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   History of blood transfusion    Hypertension     Sleep apnea    uses CPAP nightly    Past Surgical History:  Procedure Laterality Date   ABDOMINAL HYSTERECTOMY     BREAST SURGERY Right 08/14/2014   lumpectomy/lynph nodes   BREAST SURGERY Right 10/05/2016   mass removed   [place] Bilateral 10/01/11, 12/02/11   CHOLECYSTECTOMY  1987   COLONOSCOPY     CORONARY ANGIOPLASTY  01/02/2015   DES proximal LAD   DILATION AND CURETTAGE OF UTERUS  10/10/2008   hysteroscopy with biopsy   EYE SURGERY Bilateral    removed cataracts   HYSTEROSCOPY WITH D & C  08/19/2011   INCONTINENCE SURGERY  01/11/2007   JOINT REPLACEMENT Right 10/18/2013   knee   laparoscopy bilateral salpingo-oophorectomy  01/27/2012   NECK SURGERY  11/16/2017   POSTERIOR CERVICAL FUSION/FORAMINOTOMY N/A 03/29/2019   Procedure: Cervical Five to Cervical Seven Posterior cervical laminectomy and instrumented fusion;  Surgeon: Judith Part, MD;  Location: [place];  Service: Neurosurgery;  Laterality: N/A;  Cervical Five to Cervical Seven Posterior cervical laminectomy and instrumented fusion   radiation breast canceer Right    from 12/08/14-01/12/15   rotator cuff  Right 04/26/2010   repair   [place]   UPPER GI ENDOSCOPY  2016    MEDICATIONS:  febuxostat (ULORIC) 40 MG tablet   ferrous sulfate 325 (65 FE) MG tablet   iron polysaccharides (NIFEREX) 150 MG capsule   Multiple Vitamins-Minerals (ONCOVITE) TABS   nebivolol (BYSTOLIC) 10 MG tablet   omeprazole (PRILOSEC) 20 MG capsule   potassium chloride (MICRO-K) 10 MEQ CR capsule   PRESCRIPTION MEDICATION   sertraline (ZOLOFT) 100 MG tablet   torsemide (DEMADEX) 20 MG tablet   vitamin B-12 (CYANOCOBALAMIN) 500 MCG tablet   Vitamin D, Ergocalciferol, (DRISDOL) 1.25 MG (50000 UT) CAPS capsule   warfarin (COUMADIN) 2 MG tablet   No current facility-administered medications for this encounter.    Myra Gianotti, PA-C Surgical Short  Stay/Anesthesiology [facility] Phone [phone] [place] Phone [phone] 10/23/2021 6:31 PM

## 2021-10-29 ENCOUNTER — Inpatient Hospital Stay (HOSPITAL_COMMUNITY): Payer: Medicare Other

## 2021-10-29 ENCOUNTER — Encounter (HOSPITAL_COMMUNITY): Payer: Self-pay | Admitting: Neurological Surgery

## 2021-10-29 ENCOUNTER — Encounter (HOSPITAL_COMMUNITY)
Admission: RE | Disposition: A | Discharge status: Home Health | Payer: Self-pay | Source: Home / Self Care | Attending: Neurological Surgery

## 2021-10-29 ENCOUNTER — Inpatient Hospital Stay (HOSPITAL_COMMUNITY)
Admission: RE | Admit: 2021-10-29 | Discharge: 2021-10-30 | DRG: 472 | Disposition: A | Discharge status: Home Health | Payer: Medicare Other | Attending: Neurological Surgery | Admitting: Neurological Surgery

## 2021-10-29 ENCOUNTER — Other Ambulatory Visit: Payer: Self-pay

## 2021-10-29 ENCOUNTER — Inpatient Hospital Stay (HOSPITAL_COMMUNITY): Payer: Medicare Other | Admitting: Vascular Surgery

## 2021-10-29 ENCOUNTER — Inpatient Hospital Stay (HOSPITAL_BASED_OUTPATIENT_CLINIC_OR_DEPARTMENT_OTHER): Payer: Medicare Other | Admitting: Certified Registered"

## 2021-10-29 DIAGNOSIS — Z7901 Long term (current) use of anticoagulants: Secondary | ICD-10-CM

## 2021-10-29 DIAGNOSIS — G959 Disease of spinal cord, unspecified: Principal | ICD-10-CM | POA: Diagnosis present

## 2021-10-29 DIAGNOSIS — K219 Gastro-esophageal reflux disease without esophagitis: Secondary | ICD-10-CM | POA: Diagnosis present

## 2021-10-29 DIAGNOSIS — H9190 Unspecified hearing loss, unspecified ear: Secondary | ICD-10-CM | POA: Diagnosis present

## 2021-10-29 DIAGNOSIS — M4312 Spondylolisthesis, cervical region: Principal | ICD-10-CM | POA: Diagnosis present

## 2021-10-29 DIAGNOSIS — Z8249 Family history of ischemic heart disease and other diseases of the circulatory system: Secondary | ICD-10-CM | POA: Diagnosis not present

## 2021-10-29 DIAGNOSIS — G4730 Sleep apnea, unspecified: Secondary | ICD-10-CM | POA: Diagnosis present

## 2021-10-29 DIAGNOSIS — I2510 Atherosclerotic heart disease of native coronary artery without angina pectoris: Secondary | ICD-10-CM | POA: Diagnosis present

## 2021-10-29 DIAGNOSIS — F419 Anxiety disorder, unspecified: Secondary | ICD-10-CM | POA: Diagnosis present

## 2021-10-29 DIAGNOSIS — G992 Myelopathy in diseases classified elsewhere: Secondary | ICD-10-CM | POA: Diagnosis present

## 2021-10-29 DIAGNOSIS — Z853 Personal history of malignant neoplasm of breast: Secondary | ICD-10-CM

## 2021-10-29 DIAGNOSIS — I4891 Unspecified atrial fibrillation: Secondary | ICD-10-CM | POA: Diagnosis present

## 2021-10-29 DIAGNOSIS — M4802 Spinal stenosis, cervical region: Secondary | ICD-10-CM | POA: Diagnosis present

## 2021-10-29 DIAGNOSIS — M109 Gout, unspecified: Secondary | ICD-10-CM | POA: Diagnosis present

## 2021-10-29 DIAGNOSIS — I10 Essential (primary) hypertension: Secondary | ICD-10-CM | POA: Diagnosis present

## 2021-10-29 HISTORY — PX: POSTERIOR CERVICAL FUSION/FORAMINOTOMY: SHX5038

## 2021-10-29 LAB — PROTIME-INR
INR: 1.3 — ABNORMAL HIGH (ref 0.8–1.2)
Prothrombin Time: 15.7 seconds — ABNORMAL HIGH (ref 11.4–15.2)

## 2021-10-29 SURGERY — POSTERIOR CERVICAL FUSION/FORAMINOTOMY LEVEL 2
Anesthesia: General

## 2021-10-29 MED ORDER — PROPOFOL 10 MG/ML IV BOLUS
INTRAVENOUS | Status: DC | PRN
  Administered 2021-10-29: 130 mg via INTRAVENOUS
  Administered 2021-10-29: 50 mg via INTRAVENOUS
  Administered 2021-10-29: 20 mg via INTRAVENOUS

## 2021-10-29 MED ORDER — PHENYLEPHRINE HCL-NACL 20-0.9 MG/250ML-% IV SOLN
INTRAVENOUS | Status: DC | PRN
  Administered 2021-10-29: 20 ug/min via INTRAVENOUS

## 2021-10-29 MED ORDER — ONDANSETRON HCL 4 MG/2ML IJ SOLN
4.0000 mg | Freq: Four times a day (QID) | INTRAMUSCULAR | Status: DC | PRN
Start: 2021-10-29 — End: 2021-10-30

## 2021-10-29 MED ORDER — SERTRALINE HCL 50 MG PO TABS
100.0000 mg | ORAL_TABLET | Freq: Every day | ORAL | Status: DC
  Administered 2021-10-30: 100 mg via ORAL
  Filled 2021-10-29: qty 2

## 2021-10-29 MED ORDER — CHLORHEXIDINE GLUCONATE 0.12 % MT SOLN
15.0000 mL | Freq: Once | OROMUCOSAL | Status: AC
  Administered 2021-10-29: 15 mL via OROMUCOSAL
  Filled 2021-10-29: qty 15

## 2021-10-29 MED ORDER — VITAMIN D (ERGOCALCIFEROL) 1.25 MG (50000 UNIT) PO CAPS: 50000.0000 [IU] | ORAL_CAPSULE | ORAL | Status: DC

## 2021-10-29 MED ORDER — SUGAMMADEX SODIUM 200 MG/2ML IV SOLN
INTRAVENOUS | Status: DC | PRN
  Administered 2021-10-29: 200 mg via INTRAVENOUS
  Administered 2021-10-29: 100 mg via INTRAVENOUS

## 2021-10-29 MED ORDER — FENTANYL CITRATE (PF) 250 MCG/5ML IJ SOLN
INTRAMUSCULAR | Status: AC
  Filled 2021-10-29: qty 5

## 2021-10-29 MED ORDER — GLYCOPYRROLATE PF 0.2 MG/ML IJ SOSY
PREFILLED_SYRINGE | INTRAMUSCULAR | Status: DC | PRN
  Administered 2021-10-29: .2 mg via INTRAVENOUS

## 2021-10-29 MED ORDER — ROCURONIUM BROMIDE 10 MG/ML (PF) SYRINGE
PREFILLED_SYRINGE | INTRAVENOUS | Status: AC
  Filled 2021-10-29: qty 10

## 2021-10-29 MED ORDER — MENTHOL 3 MG MT LOZG: 1.0000 | LOZENGE | OROMUCOSAL | Status: DC | PRN

## 2021-10-29 MED ORDER — LACTATED RINGERS IV SOLN: INTRAVENOUS | Status: DC

## 2021-10-29 MED ORDER — CYCLOBENZAPRINE HCL 10 MG PO TABS
10.0000 mg | ORAL_TABLET | Freq: Three times a day (TID) | ORAL | Status: DC | PRN
  Administered 2021-10-29: 10 mg via ORAL
  Filled 2021-10-29: qty 1

## 2021-10-29 MED ORDER — ONDANSETRON HCL 4 MG/2ML IJ SOLN
INTRAMUSCULAR | Status: AC
  Filled 2021-10-29: qty 2

## 2021-10-29 MED ORDER — THROMBIN 5000 UNITS EX SOLR
CUTANEOUS | Status: AC
  Filled 2021-10-29: qty 5000

## 2021-10-29 MED ORDER — LACTATED RINGERS IV SOLN: INTRAVENOUS | Status: DC | PRN

## 2021-10-29 MED ORDER — EPHEDRINE SULFATE-NACL 50-0.9 MG/10ML-% IV SOSY
PREFILLED_SYRINGE | INTRAVENOUS | Status: DC | PRN
  Administered 2021-10-29: 10 mg via INTRAVENOUS

## 2021-10-29 MED ORDER — DEXAMETHASONE SODIUM PHOSPHATE 10 MG/ML IJ SOLN
INTRAMUSCULAR | Status: AC
  Filled 2021-10-29: qty 1

## 2021-10-29 MED ORDER — PHENOL 1.4 % MT LIQD: 1.0000 | OROMUCOSAL | Status: DC | PRN

## 2021-10-29 MED ORDER — ROCURONIUM BROMIDE 10 MG/ML (PF) SYRINGE
PREFILLED_SYRINGE | INTRAVENOUS | Status: DC | PRN
  Administered 2021-10-29: 60 mg via INTRAVENOUS
  Administered 2021-10-29: 20 mg via INTRAVENOUS

## 2021-10-29 MED ORDER — FENTANYL CITRATE (PF) 100 MCG/2ML IJ SOLN
INTRAMUSCULAR | Status: DC | PRN
  Administered 2021-10-29: 100 ug via INTRAVENOUS
  Administered 2021-10-29: 50 ug via INTRAVENOUS
  Administered 2021-10-29: 100 ug via INTRAVENOUS

## 2021-10-29 MED ORDER — ACETAMINOPHEN 325 MG PO TABS
650.0000 mg | ORAL_TABLET | ORAL | Status: DC | PRN
  Administered 2021-10-29: 650 mg via ORAL
  Filled 2021-10-29: qty 2

## 2021-10-29 MED ORDER — SODIUM CHLORIDE 0.9 % IV SOLN: 250.0000 mL | INTRAVENOUS | Status: DC

## 2021-10-29 MED ORDER — 0.9 % SODIUM CHLORIDE (POUR BTL) OPTIME
TOPICAL | Status: DC | PRN
  Administered 2021-10-29: 1000 mL

## 2021-10-29 MED ORDER — CEFAZOLIN SODIUM-DEXTROSE 2-4 GM/100ML-% IV SOLN
2.0000 g | Freq: Three times a day (TID) | INTRAVENOUS | Status: AC
  Administered 2021-10-29 – 2021-10-30 (×2): 2 g via INTRAVENOUS
  Filled 2021-10-29 (×2): qty 100

## 2021-10-29 MED ORDER — CEFAZOLIN SODIUM-DEXTROSE 2-4 GM/100ML-% IV SOLN
2.0000 g | INTRAVENOUS | Status: AC
  Administered 2021-10-29: 2 g via INTRAVENOUS
  Filled 2021-10-29: qty 100

## 2021-10-29 MED ORDER — LIDOCAINE-EPINEPHRINE 1 %-1:100000 IJ SOLN
INTRAMUSCULAR | Status: AC
  Filled 2021-10-29: qty 1

## 2021-10-29 MED ORDER — GLYCOPYRROLATE PF 0.2 MG/ML IJ SOSY
PREFILLED_SYRINGE | INTRAMUSCULAR | Status: AC
  Filled 2021-10-29: qty 1

## 2021-10-29 MED ORDER — ONDANSETRON HCL 4 MG PO TABS
4.0000 mg | ORAL_TABLET | Freq: Four times a day (QID) | ORAL | Status: DC | PRN
Start: 2021-10-29 — End: 2021-10-30

## 2021-10-29 MED ORDER — FENTANYL CITRATE (PF) 100 MCG/2ML IJ SOLN
INTRAMUSCULAR | Status: AC
  Filled 2021-10-29: qty 2

## 2021-10-29 MED ORDER — DOCUSATE SODIUM 100 MG PO CAPS
100.0000 mg | ORAL_CAPSULE | Freq: Two times a day (BID) | ORAL | Status: DC
  Administered 2021-10-29: 100 mg via ORAL
  Filled 2021-10-29: qty 1

## 2021-10-29 MED ORDER — POLYSACCHARIDE IRON COMPLEX 150 MG PO CAPS
150.0000 mg | ORAL_CAPSULE | Freq: Two times a day (BID) | ORAL | Status: DC
  Filled 2021-10-29 (×2): qty 1

## 2021-10-29 MED ORDER — ACETAMINOPHEN 500 MG PO TABS
1000.0000 mg | ORAL_TABLET | Freq: Once | ORAL | Status: AC
  Administered 2021-10-29: 1000 mg via ORAL
  Filled 2021-10-29: qty 2

## 2021-10-29 MED ORDER — OXYCODONE HCL 5 MG PO TABS
10.0000 mg | ORAL_TABLET | ORAL | Status: DC | PRN
  Administered 2021-10-29 (×2): 10 mg via ORAL
  Filled 2021-10-29 (×2): qty 2

## 2021-10-29 MED ORDER — BACITRACIN ZINC 500 UNIT/GM EX OINT
TOPICAL_OINTMENT | CUTANEOUS | Status: DC | PRN
  Administered 2021-10-29: 1 via TOPICAL

## 2021-10-29 MED ORDER — HYDROMORPHONE HCL 1 MG/ML IJ SOLN: 1.0000 mg | INTRAMUSCULAR | Status: DC | PRN

## 2021-10-29 MED ORDER — PANTOPRAZOLE SODIUM 40 MG PO TBEC: 40.0000 mg | DELAYED_RELEASE_TABLET | Freq: Every day | ORAL | Status: DC

## 2021-10-29 MED ORDER — LIDOCAINE 2% (20 MG/ML) 5 ML SYRINGE
INTRAMUSCULAR | Status: AC
  Filled 2021-10-29: qty 5

## 2021-10-29 MED ORDER — SODIUM CHLORIDE 0.9% FLUSH: 3.0000 mL | Freq: Two times a day (BID) | INTRAVENOUS | Status: DC

## 2021-10-29 MED ORDER — LIDOCAINE 2% (20 MG/ML) 5 ML SYRINGE
INTRAMUSCULAR | Status: DC | PRN
  Administered 2021-10-29: 60 mg via INTRAVENOUS

## 2021-10-29 MED ORDER — PROPOFOL 10 MG/ML IV BOLUS
INTRAVENOUS | Status: AC
  Filled 2021-10-29: qty 20

## 2021-10-29 MED ORDER — NEBIVOLOL HCL 10 MG PO TABS
10.0000 mg | ORAL_TABLET | Freq: Every day | ORAL | Status: DC
  Filled 2021-10-29: qty 1

## 2021-10-29 MED ORDER — ONDANSETRON HCL 4 MG/2ML IJ SOLN
INTRAMUSCULAR | Status: DC | PRN
  Administered 2021-10-29: 4 mg via INTRAVENOUS

## 2021-10-29 MED ORDER — ACETAMINOPHEN 650 MG RE SUPP: 650.0000 mg | RECTAL | Status: DC | PRN

## 2021-10-29 MED ORDER — LIDOCAINE-EPINEPHRINE 1 %-1:100000 IJ SOLN
INTRAMUSCULAR | Status: DC | PRN
  Administered 2021-10-29: 10 mL

## 2021-10-29 MED ORDER — CHLORHEXIDINE GLUCONATE CLOTH 2 % EX PADS: 6.0000 | MEDICATED_PAD | Freq: Once | CUTANEOUS | Status: DC

## 2021-10-29 MED ORDER — CYANOCOBALAMIN 500 MCG PO TABS
500.0000 ug | ORAL_TABLET | Freq: Every day | ORAL | Status: DC
  Filled 2021-10-29: qty 1

## 2021-10-29 MED ORDER — POTASSIUM CHLORIDE CRYS ER 10 MEQ PO TBCR: 10.0000 meq | EXTENDED_RELEASE_TABLET | Freq: Every day | ORAL | Status: DC

## 2021-10-29 MED ORDER — BACITRACIN ZINC 500 UNIT/GM EX OINT
TOPICAL_OINTMENT | CUTANEOUS | Status: AC
  Filled 2021-10-29: qty 28.35

## 2021-10-29 MED ORDER — ORAL CARE MOUTH RINSE: 15.0000 mL | Freq: Once | OROMUCOSAL | Status: AC

## 2021-10-29 MED ORDER — FERROUS SULFATE 325 (65 FE) MG PO TABS
325.0000 mg | ORAL_TABLET | Freq: Every day | ORAL | Status: DC
  Administered 2021-10-30: 325 mg via ORAL
  Filled 2021-10-29: qty 1

## 2021-10-29 MED ORDER — TORSEMIDE 20 MG PO TABS
20.0000 mg | ORAL_TABLET | Freq: Every day | ORAL | Status: DC
  Filled 2021-10-29: qty 1

## 2021-10-29 MED ORDER — FENTANYL CITRATE (PF) 100 MCG/2ML IJ SOLN
25.0000 ug | INTRAMUSCULAR | Status: DC | PRN
  Administered 2021-10-29: 50 ug via INTRAVENOUS

## 2021-10-29 MED ORDER — FEBUXOSTAT 40 MG PO TABS
40.0000 mg | ORAL_TABLET | Freq: Every day | ORAL | Status: DC
  Filled 2021-10-29: qty 1

## 2021-10-29 MED ORDER — DEXAMETHASONE SODIUM PHOSPHATE 10 MG/ML IJ SOLN
INTRAMUSCULAR | Status: DC | PRN
  Administered 2021-10-29: 10 mg via INTRAVENOUS

## 2021-10-29 MED ORDER — OXYCODONE HCL 5 MG PO TABS
5.0000 mg | ORAL_TABLET | ORAL | Status: DC | PRN
  Administered 2021-10-30: 5 mg via ORAL
  Filled 2021-10-29: qty 1

## 2021-10-29 MED ORDER — POLYETHYLENE GLYCOL 3350 17 G PO PACK: 17.0000 g | PACK | Freq: Every day | ORAL | Status: DC | PRN

## 2021-10-29 MED ORDER — SODIUM CHLORIDE 0.9% FLUSH: 3.0000 mL | INTRAVENOUS | Status: DC | PRN

## 2021-10-29 MED ORDER — ARTIFICIAL TEARS OPHTHALMIC OINT
TOPICAL_OINTMENT | OPHTHALMIC | Status: AC
  Filled 2021-10-29: qty 3.5

## 2021-10-29 MED ORDER — THROMBIN 5000 UNITS EX SOLR
OROMUCOSAL | Status: DC | PRN
  Administered 2021-10-29: 5 mL via TOPICAL

## 2021-10-29 SURGICAL SUPPLY — 54 items
BAG COUNTER SPONGE SURGICOUNT (BAG) ×3 IMPLANT
BENZOIN TINCTURE PRP APPL 2/3 (GAUZE/BANDAGES/DRESSINGS) IMPLANT
BIT DRILL 2.4 (BIT) ×1 IMPLANT
BLADE CLIPPER SURG (BLADE) ×2 IMPLANT
BUR MATCHSTICK NEURO 3.0 LAGG (BURR) IMPLANT
BUR PRECISION FLUTE 5.0 (BURR) ×2 IMPLANT
CANISTER SUCT 3000ML PPV (MISCELLANEOUS) ×2 IMPLANT
DERMABOND ADVANCED (GAUZE/BANDAGES/DRESSINGS) ×1
DERMABOND ADVANCED .7 DNX12 (GAUZE/BANDAGES/DRESSINGS) ×1 IMPLANT
DRAPE C-ARM 42X72 X-RAY (DRAPES) ×4 IMPLANT
DRAPE LAPAROTOMY 100X72 PEDS (DRAPES) ×2 IMPLANT
DURAPREP 6ML APPLICATOR 50/CS (WOUND CARE) ×2 IMPLANT
ELECT REM PT RETURN 9FT ADLT (ELECTROSURGICAL) ×2
ELECTRODE REM PT RTRN 9FT ADLT (ELECTROSURGICAL) ×1 IMPLANT
GAUZE 4X4 16PLY ~~LOC~~+RFID DBL (SPONGE) IMPLANT
GAUZE SPONGE 4X4 12PLY STRL (GAUZE/BANDAGES/DRESSINGS) IMPLANT
GLOVE BIOGEL PI IND STRL 7.5 (GLOVE) ×1 IMPLANT
GLOVE BIOGEL PI INDICATOR 7.5 (GLOVE) ×1
GLOVE ECLIPSE 7.5 STRL STRAW (GLOVE) ×3 IMPLANT
GLOVE EXAM NITRILE LRG STRL (GLOVE) IMPLANT
GLOVE EXAM NITRILE XL STR (GLOVE) IMPLANT
GLOVE EXAM NITRILE XS STR PU (GLOVE) IMPLANT
GOWN STRL REUS W/ TWL LRG LVL3 (GOWN DISPOSABLE) IMPLANT
GOWN STRL REUS W/ TWL XL LVL3 (GOWN DISPOSABLE) ×1 IMPLANT
GOWN STRL REUS W/TWL 2XL LVL3 (GOWN DISPOSABLE) IMPLANT
GOWN STRL REUS W/TWL LRG LVL3 (GOWN DISPOSABLE) ×2
GOWN STRL REUS W/TWL XL LVL3 (GOWN DISPOSABLE)
GRAFT BN 5X1XSPNE CVD POST DBM (Bone Implant) IMPLANT
GRAFT BONE MAGNIFUSE 1X5CM (Bone Implant) ×1 IMPLANT
HEMOSTAT POWDER KIT SURGIFOAM (HEMOSTASIS) ×2 IMPLANT
KIT BASIN OR (CUSTOM PROCEDURE TRAY) ×2 IMPLANT
KIT TURNOVER KIT B (KITS) ×2 IMPLANT
NEEDLE HYPO 22GX1.5 SAFETY (NEEDLE) ×2 IMPLANT
NS IRRIG 1000ML POUR BTL (IV SOLUTION) ×2 IMPLANT
PACK LAMINECTOMY NEURO (CUSTOM PROCEDURE TRAY) ×2 IMPLANT
PAD ARMBOARD 7.5X6 YLW CONV (MISCELLANEOUS) ×6 IMPLANT
PIN MAYFIELD SKULL DISP (PIN) ×2 IMPLANT
ROD PRECUT 3.5X60 (Rod) ×2 IMPLANT
SCREW MULTI AXIAL 3.5X14MM (Screw) ×4 IMPLANT
SET SCREW INFINITY IFIX THOR (Screw) ×10 IMPLANT
SPIKE FLUID TRANSFER (MISCELLANEOUS) ×1 IMPLANT
SPONGE T-LAP 4X18 ~~LOC~~+RFID (SPONGE) IMPLANT
STAPLER VISISTAT 35W (STAPLE) ×2 IMPLANT
SUT ETHILON 3 0 FSL (SUTURE) IMPLANT
SUT MNCRL AB 3-0 PS2 18 (SUTURE) ×2 IMPLANT
SUT MON AB 3-0 SH 27 (SUTURE) ×1
SUT MON AB 3-0 SH27 (SUTURE) ×1 IMPLANT
SUT VIC AB 0 CT1 18XCR BRD8 (SUTURE) ×1 IMPLANT
SUT VIC AB 0 CT1 8-18 (SUTURE) ×1
SUT VIC AB 2-0 CP2 18 (SUTURE) ×3 IMPLANT
TOWEL GREEN STERILE (TOWEL DISPOSABLE) ×2 IMPLANT
TOWEL GREEN STERILE FF (TOWEL DISPOSABLE) ×2 IMPLANT
UNDERPAD 30X36 HEAVY ABSORB (UNDERPADS AND DIAPERS) ×1 IMPLANT
WATER STERILE IRR 1000ML POUR (IV SOLUTION) ×2 IMPLANT

## 2021-10-29 NOTE — Discharge Summary (Signed)
Discharge Summary  Date of Admission: 10/29/2021  Date of Discharge: 10/30/21  Attending Physician: Emelda Brothers, MD  Hospital Course: Patient was admitted following an uncomplicated posterior cervical decompression and extension of her prior posterior cervical fusion. They were recovered in PACU and transferred to [facility]. Her neurologic function was improved post-op, her hospital course was uncomplicated and the patient was discharged home on 10/30/21. They will follow up in clinic with me in clinic in 2 weeks.  Neurologic exam at discharge:  Strength 4 to 4+/5 in BUE / BLE, +bilateral hoffman's but decreased on the R from preop, reflexes 3+ greater on the left than right  Discharge diagnosis: Cervical myelopathy, cervical spondylolisthesis  Judith Part, MD 10/29/21 8:36 PM

## 2021-10-29 NOTE — Progress Notes (Signed)
Patient refused CPAP for the night.  Patient prefers to wear [place].

## 2021-10-29 NOTE — Transfer of Care (Signed)
Immediate Anesthesia Transfer of Care Note  Patient: Natasha Lawson  Procedure(s) Performed: Cervical three, Cervical four Laminectomies with Cervical three-Cervical five posterior instrumented fusion  Patient Location: PACU  Anesthesia Type:General  Level of Consciousness: awake and oriented  Airway & Oxygen Therapy: Patient Spontanous Breathing  Post-op Assessment: Report given to RN  Post vital signs: Reviewed and stable  Last Vitals:  Vitals Value Taken Time  BP 150/53 10/29/21 1522  Temp    Pulse 68 10/29/21 1524  Resp 15 10/29/21 1524  SpO2 96 % 10/29/21 1524  Vitals shown include unvalidated device data.  Last Pain:  Vitals:   10/29/21 1105  TempSrc:   PainSc: 0-No pain      Patients Stated Pain Goal: 2 (87/68/11 5726)  Complications: No notable events documented.

## 2021-10-29 NOTE — Op Note (Signed)
PATIENT: Natasha Lawson  DAY OF SURGERY: 10/29/21   PRE-OPERATIVE DIAGNOSIS:  Cervical myelopathy, cervical spondylolisthesis   POST-OPERATIVE DIAGNOSIS:  Same   PROCEDURE:  C3, C4 laminectomies, posterior cervical fusion C3-C5 with extension of prior cervical fusion   SURGEON:  Surgeon(s) and Role:    Judith Part, MD - Primary    Consuella Lose, MD - Assisting   ANESTHESIA: ETGA   BRIEF HISTORY: This is a 80 year old woman with a complex neurosurgical history, presented to me after an SCI during an anterior corpectomy by another Psychologist, sport and exercise. I performed a posterior decompression and instrumented fusion and she remained stable until recently, when she began having worsening balance and falls without an alternative explanation. Repeat MRI showed moderate to severe canal stenosis at C3-4 with a grade 1 spondylolisthesis at C4-5. Given her prior SCI, she has baseline signs and symptoms of myelopathy and difficult to follow her neurologic exam alone. Given her worsening symptoms without an alternative explanation, I recommended surgical intervention in the form of decompression and extension of her fusion. This was discussed with the patient as well as risks, benefits, and alternatives and wished to proceed with surgery.   OPERATIVE DETAIL: The patient was taken to the operating room, anesthesia was induced by the anesthesia team, the [place] head holder was placed and the patient was gently placed on the OR table in the prone position. The [place] was secured after all pressure points were padded. A formal time out was performed with two patient identifiers and confirmed the operative site. The operative site was marked, hair was clipped with surgical clippers, the area was then prepped and draped in a sterile fashion.   The patient's prior posterior cervical incision was opened and extended cranially. After subperiosteal dissection at C3 and C4, fluoroscopy was used to confirm the correct  levels and decompressive laminectomies were performed at C3 and C4 with a combination of high speed drill and kerrison rongeurs. This bone was saved, morselized, and later used as autograft for the fusion. There was significant stenosis at the C3-4 interspace, which was carefully decompressed, with good decompression afterwards. Attention was then turned to instrumentation.   The prior caps and rods were removed and new lateral mass screws were placed at C3 and C4 bilaterally using standard landmarks. A pilot hole was created with a high speed drill and cannula followed by palpation, tapping, palpation, and screw placement. The lateral masses were then decorticated and autograft was placed bilaterally along the fusion surfaces. A new rod was bent to fit the construct and inserted into the C3 through C7 lateral mass screws, caps were placed and torqued to manufacturer specifications, and final fluoroscopic images were used to confirm the hardware was in position.   The wound was copiously irrigated, all instrument and sponge counts were correct, and the incision was then closed in layers. The patient was then returned to anesthesia for emergence. No apparent complications at the completion of the procedure.   EBL:  182m   DRAINS: none   SPECIMENS: none   TJudith Part MD 10/29/21 12:06 PM

## 2021-10-29 NOTE — Anesthesia Procedure Notes (Signed)
Procedure Name: Intubation Date/Time: 10/29/2021 12:25 PM  Performed by: Colin Benton, CRNAPre-anesthesia Checklist: Patient identified, Emergency Drugs available, Suction available and Patient being monitored Patient Re-evaluated:Patient Re-evaluated prior to induction Oxygen Delivery Method: Circle system utilized Preoxygenation: Pre-oxygenation with 100% oxygen Induction Type: IV induction Ventilation: Mask ventilation without difficulty Laryngoscope Size: Glidescope and 4 Grade View: Grade I Tube type: Oral Tube size: 7.0 mm Number of attempts: 1 Airway Equipment and Method: Rigid stylet and Video-laryngoscopy Placement Confirmation: ETT inserted through vocal cords under direct vision, positive ETCO2 and breath sounds checked- equal and bilateral Secured at: 22 cm Tube secured with: Tape Dental Injury: Teeth and Oropharynx as per pre-operative assessment  Difficulty Due To: Difficult Airway- due to reduced neck mobility Comments: Elective glidescope intubation due to limited neck mobility.

## 2021-10-29 NOTE — H&P (Signed)
Surgical H&P Update  HPI: 81 y.o. with a history of prior anterior cervical corpectomy with another surgeon with corpectomy, presented to me originally with continued canal stenosis, I performed a posterior cervical and she later re-presented with worsening gait. Workup showed a spondylolisthesis with canal stenosis adjacent to her prior segments, cranially. I therefore recommended surgery. No changes in health since they were last seen. Still having the above and wishes to proceed with surgery.  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 She has never used smokeless tobacco. She reports that she does not drink alcohol and does not use drugs.  Physical Exam: Strength exam 4-4+ /5 except pain limited R shoulder and L grip, +hoffman's b/l, reflexes 3+ on left greater than right  Assesment/Plan: 81 y.o. woman with recurrent cervical myelopathy, here for posterior cervical laminectomy and extension of fusion. Risks, benefits, and alternatives discussed and the patient would like to continue with surgery.  -OR today -3C post-op  Judith Part, MD 10/29/21 11:46 AM

## 2021-10-29 NOTE — Anesthesia Postprocedure Evaluation (Signed)
Anesthesia Post Note  Patient: Kismet Facemire  Procedure(s) Performed: Cervical three, Cervical four Laminectomies with Cervical three-Cervical five posterior instrumented fusion     Patient location during evaluation: PACU Anesthesia Type: General Level of consciousness: awake and alert Pain management: pain level controlled Vital Signs Assessment: post-procedure vital signs reviewed and stable Respiratory status: spontaneous breathing, nonlabored ventilation and respiratory function stable Cardiovascular status: blood pressure returned to baseline and stable Postop Assessment: no apparent nausea or vomiting Anesthetic complications: no   No notable events documented.  Last Vitals:  Vitals:   10/29/21 1604 10/29/21 1615  BP: (!) 162/71 (!) 171/87  Pulse: 66 68  Resp: 12 13  Temp:    SpO2: 100% 100%    Last Pain:  Vitals:   10/29/21 1523  TempSrc:   PainSc: 0-No pain                 Norvell Ureste,W. EDMOND

## 2021-10-30 MED ORDER — OXYCODONE-ACETAMINOPHEN 5-325 MG PO TABS: 1.0000 | ORAL_TABLET | ORAL | 0 refills | Status: DC | PRN

## 2021-10-30 MED ORDER — CYCLOBENZAPRINE HCL 10 MG PO TABS: 10.0000 mg | ORAL_TABLET | Freq: Three times a day (TID) | ORAL | 2 refills | Status: DC | PRN

## 2021-10-30 NOTE — Evaluation (Signed)
Physical Therapy Evaluation  Patient Details Name: Natasha Lawson MRN: [id number] DOB: 02-17-1941 Today's Date: 10/30/2021  History of Present Illness  Pt is a 81  yo female presenting on 10/29/21 for elective C3, C4 laminectomies, posterior cervical fusion C3-5 with extension of prior cervical fusion.  PMH includes: anemia, anxiety, R breast CA, CAD, gout, HTN, prior cervical surgery.  Clinical Impression  Pt admitted with above diagnosis. At the time of PT eval, pt was able to demonstrate transfers and ambulation with up to mod assist and RW for support. Husband present throughout and supportive. Pt was educated on precautions, appropriate activity progression, and car transfer. Pt currently with functional limitations due to the deficits listed below (see PT Problem List). Pt will benefit from skilled PT to increase their independence and safety with mobility to allow discharge to the venue listed below.         Recommendations for follow up therapy are one component of a multi-disciplinary discharge planning process, led by the attending physician.  Recommendations may be updated based on patient status, additional functional criteria and insurance authorization.  Follow Up Recommendations Home health PT      Assistance Recommended at Discharge Frequent or constant Supervision/Assistance  Patient can return home with the following  A little help with walking and/or transfers;A little help with bathing/dressing/bathroom;Assistance with cooking/housework;Assist for transportation;Help with stairs or ramp for entrance    Equipment Recommendations None recommended by PT  Recommendations for Other Services       Functional Status Assessment Patient has had a recent decline in their functional status and demonstrates the ability to make significant improvements in function in a reasonable and predictable amount of time.     Precautions / Restrictions Precautions Precautions:  Cervical;Fall Precaution Booklet Issued: Yes (comment) Precaution Comments: Reviewed handout and pt was cued for precautions during functional mobility. Required Braces or Orthoses:  (no brace needed) Restrictions Weight Bearing Restrictions: No      Mobility  Bed Mobility Overal bed mobility: Needs Assistance Bed Mobility: Rolling, Sidelying to Sit, Sit to Sidelying Rolling: Min assist Sidelying to sit: Min guard     Sit to sidelying: Max assist General bed mobility comments: HOB slightly elevated and use of rails to simulate home environment. Assist required throughout log roll. Pt with increased difficulty with sit>sidelying and overall poor technique/inability to elevate LE's up into bed without rolling onto back at the same time.    Transfers Overall transfer level: Needs assistance Equipment used: Rolling walker (2 wheels) Transfers: Sit to/from Stand Sit to Stand: Mod assist           General transfer comment: Increased time and mod assist to power up to full stand. Pt with good initiation, but required that mod assist for boost into full stand.    Ambulation/Gait Ambulation/Gait assistance: Min assist, +2 safety/equipment (Chair follow) Gait Distance (Feet): 150 Feet Assistive device: Rolling walker (2 wheels) Gait Pattern/deviations: Step-through pattern, Decreased stride length, Trunk flexed, Decreased dorsiflexion - right Gait velocity: Decreased Gait velocity interpretation: <1.31 ft/sec, indicative of household ambulator   General Gait Details: VC's for improved posture, closer walker proximity, forward gaze, and increased heel strike on the R. Pt able to make corrective changes but unable to maintain. Chair follow for safety but pt did not require a seated rest break during gait training.  Stairs            Wheelchair Mobility    Modified Rankin (Stroke Patients Only)  Balance Overall balance assessment: Needs assistance Sitting-balance  support: No upper extremity supported, Feet supported Sitting balance-Leahy Scale: Poor Sitting balance - Comments: posterior lean preference, cueing for anterior shift; requires min guard to min assist Postural control: Posterior lean Standing balance support: Bilateral upper extremity supported, No upper extremity supported, During functional activity Standing balance-Leahy Scale: Poor Standing balance comment: relies on BUE support                             Pertinent Vitals/Pain Pain Assessment Pain Assessment: Faces Faces Pain Scale: Hurts a little bit Pain Location: cervical incision Pain Descriptors / Indicators: Discomfort, Operative site guarding Pain Intervention(s): Limited activity within patient's tolerance, Monitored during session, Repositioned    Home Living Family/patient expects to be discharged to:: Private residence Living Arrangements: Spouse/significant other Available Help at Discharge: Family;Available 24 hours/day Type of Home: House Home Access: Stairs to enter Entrance Stairs-Rails: Can reach both Entrance Stairs-Number of Steps: 1   Home Layout: One level Home Equipment: Conservation officer, nature (2 wheels);Rollator (4 wheels);BSC/3in1;Shower seat;Grab bars - tub/shower;Toilet riser;Wheelchair - manual;Other (comment) (lift chair, bed rail)      Prior Function Prior Level of Function : Needs assist;History of Falls (last six months)             Mobility Comments: reports using rollator vs wc for mobility, spouse assisting with transfers (pt not needing assist only if in lift chair), and 3 falls since sept ADLs Comments: needs assist for LB ADLs, transfers, and IADLS.     Hand Dominance   Dominant Hand: Right    Extremity/Trunk Assessment   Upper Extremity Assessment Upper Extremity Assessment: Generalized weakness RUE Deficits / Details: limited shoudler flexion to 90* (reports injury after a fall and never achieved more than 90*  flexion) LUE Deficits / Details: decreased sensation and coordination LUE Sensation: decreased light touch LUE Coordination: decreased fine motor;decreased gross motor    Lower Extremity Assessment Lower Extremity Assessment: Generalized weakness;RLE deficits/detail;LLE deficits/detail RLE Deficits / Details: R side grossly weaker than L with decreased heel strike and ankle DF.    Cervical / Trunk Assessment Cervical / Trunk Assessment: Neck Surgery  Communication   Communication: No difficulties  Cognition Arousal/Alertness: Awake/alert Behavior During Therapy: WFL for tasks assessed/performed Overall Cognitive Status: Within Functional Limits for tasks assessed                                          General Comments General comments (skin integrity, edema, etc.): spouse present at end of session, reviewed recommendations    Exercises     Assessment/Plan    PT Assessment Patient needs continued PT services  PT Problem List Decreased strength;Decreased activity tolerance;Decreased balance;Decreased mobility;Decreased knowledge of use of DME;Decreased safety awareness;Decreased knowledge of precautions;Pain       PT Treatment Interventions DME instruction;Gait training;Functional mobility training;Therapeutic activities;Therapeutic exercise;Balance training;Patient/family education    PT Goals (Current goals can be found in the Care Plan section)  Acute Rehab PT Goals Patient Stated Goal: Get better PT Goal Formulation: With patient/family Time For Goal Achievement: 11/13/21 Potential to Achieve Goals: Good    Frequency Min 5X/week     Co-evaluation               AM-PAC PT "6 Clicks" Mobility  Outcome Measure Help needed turning from your back to your  side while in a flat bed without using bedrails?: A Little Help needed moving from lying on your back to sitting on the side of a flat bed without using bedrails?: A Lot Help needed moving to  and from a bed to a chair (including a wheelchair)?: A Lot Help needed standing up from a chair using your arms (e.g., wheelchair or bedside chair)?: A Lot Help needed to walk in hospital room?: A Little Help needed climbing 3-5 steps with a railing? : A Lot 6 Click Score: 14    End of Session Equipment Utilized During Treatment: Gait belt Activity Tolerance: Patient tolerated treatment well Patient left: in bed;with call bell/phone within reach;with family/visitor present Nurse Communication: Mobility status PT Visit Diagnosis: Unsteadiness on feet (R26.81);Pain Pain - part of body:  (neck)    Time: 9892-1194 PT Time Calculation (min) (ACUTE ONLY): 21 min   Charges:   PT Evaluation $PT Eval Low Complexity: [place], PT, DPT Acute Rehabilitation Services Secure Chat Preferred Office: [phone]   Thelma Comp 10/30/2021, 10:45 AM

## 2021-10-30 NOTE — Evaluation (Addendum)
Occupational Therapy Evaluation Patient Details Name: Natasha Lawson MRN: [id number] DOB: 01-Oct-1940 Today's Date: 10/30/2021   History of Present Illness Pt is a 81  yo female presenting on 7/18 for elective C3, C4 laminectomies, posterior cervical cusing C3-5 with extension of prior cervical fusion.  PMH includes: anemia, anxiety, R breast CA, CAD, gout, HTN, prior cervical surgery.   Clinical Impression   PTA patient reports needing assist for transfers (if not in lift chair), mobility (w/c vs rollator) and ADLs from spouse. Reports 3 falls since Sept.  Admitted for above and limited by problem list below, including generalized weakness, impaired balance, decreased activity tolerance, impaired coordination and sensation of LUE, and cervical precautions.  Patient educated on precautions, safety, ADL compensatory techniques, recommendations and DME.  Today, pt requires min-mod assist for transfers, min to min guard for mobility using RW and min to max assist for ADLs.  She presents with posterior lean preference in sitting and standing, requiring cueing throughout session.  Good adherence to cervical precautions. Pt report spouse is present at home and can assist as needed, educated pt and spouse on use of RW and hands on assist for ADLs, transfer and mobility at this time. Based on performance today, believe she will best benefit from continued OT services while admitted and after dc at [facility] level to optimize independence, safety with ADLs and mobility.      Recommendations for follow up therapy are one component of a multi-disciplinary discharge planning process, led by the attending physician.  Recommendations may be updated based on patient status, additional functional criteria and insurance authorization.   Follow Up Recommendations  Home health OT    Assistance Recommended at Discharge Frequent or constant Supervision/Assistance  Patient can return home with the following A little help with  walking and/or transfers;A little help with bathing/dressing/bathroom;Assistance with cooking/housework;Assist for transportation;Help with stairs or ramp for entrance    Functional Status Assessment  Patient has had a recent decline in their functional status and demonstrates the ability to make significant improvements in function in a reasonable and predictable amount of time.  Equipment Recommendations  None recommended by OT    Recommendations for Other Services PT consult     Precautions / Restrictions Precautions Precautions: Cervical;Fall Precaution Booklet Issued: Yes (comment) Precaution Comments: reviewed with pt Required Braces or Orthoses:  (no brace needed) Restrictions Weight Bearing Restrictions: No      Mobility Bed Mobility Overal bed mobility: Needs Assistance Bed Mobility: Rolling, Sidelying to Sit, Sit to Sidelying Rolling: Max assist Sidelying to sit: Mod assist     Sit to sidelying: Mod assist General bed mobility comments: use of rail, cueing for log roll technique with max assist to roll towrads R side, assist for LB and trunk support to ascend.  increased time and cueing to scoot to EOB.  Returned to supine with assist for BLEs back to sidelying    Transfers Overall transfer level: Needs assistance Equipment used: Rolling walker (2 wheels) Transfers: Sit to/from Stand Sit to Stand: Mod assist, Min assist           General transfer comment: initally mod assist to power up, adjusted hands (R hand on RW) and progressed to min assist.  continues to require cueing for anterior translation, posture and physical assistance for balance      Balance Overall balance assessment: Needs assistance Sitting-balance support: No upper extremity supported, Feet supported Sitting balance-Leahy Scale: Poor Sitting balance - Comments: posterior lean preference, cueing for anterior shift;  requires min guard to min assist Postural control: Posterior  lean Standing balance support: Bilateral upper extremity supported, No upper extremity supported, During functional activity Standing balance-Leahy Scale: Poor Standing balance comment: relies on BUE support                           ADL either performed or assessed with clinical judgement   ADL Overall ADL's : Needs assistance/impaired     Grooming: Minimal assistance;Sitting Grooming Details (indicate cue type and reason): to reach back of head to comb, setup for washing face         Upper Body Dressing : Sitting;Min guard Upper Body Dressing Details (indicate cue type and reason): cueing for technique, L UE head and then R UE; limited by balance requiring min guard unsupported Lower Body Dressing: Maximal assistance;Sit to/from stand Lower Body Dressing Details (indicate cue type and reason): requires assist for socks, shoes and pants; able to pull up over hips when given min assist for balance. Toilet Transfer: Minimal assistance;Ambulation;Rolling walker (2 wheels) Toilet Transfer Details (indicate cue type and reason): to power up and steady with cueing for hand placement and safety         Functional mobility during ADLs: Minimal assistance;Rolling walker (2 wheels) General ADL Comments: pt remains limited by impaired balance, decreased activity [place], generalized weakness and cervical precautions     Vision   Vision Assessment?: No apparent visual deficits     Perception     Praxis      Pertinent Vitals/Pain Pain Assessment Pain Assessment: Faces Faces Pain Scale: Hurts a little bit Pain Location: cervical incision Pain Descriptors / Indicators: Discomfort, Operative site guarding Pain Intervention(s): Limited activity within patient's tolerance, Monitored during session, Repositioned     Hand Dominance Right   Extremity/Trunk Assessment Upper Extremity Assessment Upper Extremity Assessment: Generalized weakness;RUE deficits/detail;LUE  deficits/detail RUE Deficits / Details: limited shoudler flexion to 90* (reports injury after a fall and never achieved more than 90* flexion) LUE Deficits / Details: decreased sensation and coordination LUE Sensation: decreased light touch LUE Coordination: decreased fine motor;decreased gross motor   Lower Extremity Assessment Lower Extremity Assessment: Defer to PT evaluation   Cervical / Trunk Assessment Cervical / Trunk Assessment: Neck Surgery   Communication Communication Communication: No difficulties   Cognition Arousal/Alertness: Awake/alert Behavior During Therapy: WFL for tasks assessed/performed Overall Cognitive Status: Within Functional Limits for tasks assessed                                       General Comments  spouse present at end of session, reviewed recommendations    Exercises     Shoulder Instructions      Home Living Family/patient expects to be discharged to:: Private residence Living Arrangements: Spouse/significant other Available Help at Discharge: Family;Available 24 hours/day Type of Home: House Home Access: Stairs to enter [employer] of Steps: 1 Entrance Stairs-Rails: Can reach both Home Layout: One level     Bathroom Shower/Tub: Occupational psychologist: Standard     Home Equipment: Conservation officer, nature (2 wheels);Rollator (4 wheels);BSC/3in1;Shower seat;Grab bars - tub/shower;Toilet riser;Wheelchair - manual;Other (comment) (lift chair, bed rail)          Prior Functioning/Environment Prior Level of Function : Needs assist;History of Falls (last six months)             Mobility  Comments: reports using rollator vs wc for mobility, spouse assisting with transfers (pt not needing assist only if in lift chair), and 3 falls since sept ADLs Comments: needs assist for LB ADLs, transfers, and IADLS.        OT Problem List: Decreased strength;Decreased range of motion;Decreased activity  tolerance;Impaired balance (sitting and/or standing);Decreased coordination;Decreased knowledge of use of DME or AE;Decreased knowledge of precautions;Impaired sensation;Impaired UE functional use;Obesity      OT Treatment/Interventions: Self-care/ADL training;Therapeutic exercise;Energy conservation;Neuromuscular education;DME and/or AE instruction;Therapeutic activities;Patient/family education;Balance training    OT Goals(Current goals can be found in the care plan section) Acute Rehab OT Goals Patient Stated Goal: home, get better OT Goal Formulation: With patient Time For Goal Achievement: 11/13/21 Potential to Achieve Goals: Good  OT Frequency: Min 2X/week    Co-evaluation              AM-PAC OT "6 Clicks" Daily Activity     Outcome Measure Help from another person eating meals?: A Little Help from another person taking care of personal grooming?: A Little Help from another person toileting, which includes using toliet, bedpan, or urinal?: A Little Help from another person bathing (including washing, rinsing, drying)?: A Lot Help from another person to put on and taking off regular upper body clothing?: A Little Help from another person to put on and taking off regular lower body clothing?: A Lot 6 Click Score: 16   End of Session Equipment Utilized During Treatment: Gait belt;Rolling walker (2 wheels) Nurse Communication: Mobility status  Activity Tolerance: Patient tolerated treatment well Patient left: with call bell/phone within reach;in bed;with family/visitor present  OT Visit Diagnosis: Other abnormalities of gait and mobility (R26.89);Muscle weakness (generalized) (M62.81);History of falling (Z91.81);Other symptoms and signs involving the nervous system (R29.898)                Time: 8768-1157 OT Time Calculation (min): 29 min Charges:  OT General Charges $OT Visit: 1 Visit OT Evaluation $OT Eval Low Complexity: 1 Low OT Treatments $Self Care/Home Management  : 8-22 mins  Jolaine Artist, OT Acute Rehabilitation Services Office [phone]   Delight Stare 10/30/2021, 10:03 AM

## 2021-10-30 NOTE — Progress Notes (Signed)
Pt doing well. Pt and husband given D/C instructions with verbal understanding. Rx's were sent to the pharmacy by MD. Pt's incision is clean and dry with no sign of infection. Pt's IV was removed prior to D/C. Home Health was arranged by TOC prior to D/C. Pt D/C'd home via wheelchair per MD order. Pt is stable @ D/C and has no other needs at this time. Holli Humbles, RN

## 2021-10-30 NOTE — TOC Transition Note (Signed)
Transition of Care [facility]) - CM/SW Discharge Note   Patient Details  Name: Natasha Lawson MRN: [id number] Date of Birth: 1941/02/02  Transition of Care [facility]) CM/SW Contact:  Pollie Friar, RN Phone Number: 10/30/2021, 10:21 AM   Clinical Narrative:    Pt is discharging home with home health services through [facility]. Information faxed to: [phone]. Pt sates she has all needed DME at home.  Pts spouse is able to provide needed supervision/ assistance at home and transportation home.    Final next level of care: Home w Home Health Services Barriers to Discharge: No Barriers Identified   Patient Goals and CMS Choice   CMS Medicare.gov Compare Post Acute Care list provided to:: Patient Choice offered to / list presented to : Patient  Discharge Placement                       Discharge Plan and Services                          HH Arranged: OT, PT [facility] Agency: [place] Date [place]: 10/30/21   Representative spoke with at [place]: [place]  Social Determinants of Health ([place]) Interventions     Readmission Risk Interventions     No data to display

## 2021-10-30 NOTE — Progress Notes (Signed)
Neurosurgery Service Progress Note  Subjective: No acute events overnight, already getting some improvement in function - better distal left hand function, gait feels improved   Objective: Vitals:   10/29/21 1956 10/29/21 2311 10/30/21 0412 10/30/21 0748  BP: (!) 158/64 (!) 170/66 139/61 (!) 104/44  Pulse: 74 60 64 (!) 58  Resp: '20 20 18 16  '$ Temp: 97.7 F (36.5 C) 97.9 F (36.6 C) (!) 97.4 F (36.3 C) 97.9 F (36.6 C)  TempSrc: Oral Oral Oral Oral  SpO2: 97% 96% 100% 93%  Weight:      Height:        Physical Exam: Strength diffusely 4 to 4+/5 x4, hoffman's b/l but improved from preop, reflexes 3+ x4  Assessment & Plan: 81 y.o. woman s/p cervical lami / PSIF / extension of fusion, recovering well.  -discharge home today  Judith Part  10/30/21 8:54 AM

## 2021-11-01 ENCOUNTER — Encounter (HOSPITAL_COMMUNITY): Payer: Self-pay | Admitting: Neurological Surgery

## 2022-01-16 ENCOUNTER — Inpatient Hospital Stay: Payer: Medicare Other | Attending: Physician Assistant

## 2022-01-16 DIAGNOSIS — D696 Thrombocytopenia, unspecified: Secondary | ICD-10-CM | POA: Insufficient documentation

## 2022-01-16 DIAGNOSIS — D508 Other iron deficiency anemias: Secondary | ICD-10-CM

## 2022-01-16 DIAGNOSIS — Z7901 Long term (current) use of anticoagulants: Secondary | ICD-10-CM | POA: Insufficient documentation

## 2022-01-16 DIAGNOSIS — D539 Nutritional anemia, unspecified: Secondary | ICD-10-CM | POA: Insufficient documentation

## 2022-01-16 LAB — CBC WITH DIFFERENTIAL/PLATELET
Abs Immature Granulocytes: 0.02 10*3/uL (ref 0.00–0.07)
Basophils Absolute: 0 10*3/uL (ref 0.0–0.1)
Basophils Relative: 1 %
Eosinophils Absolute: 0.1 10*3/uL (ref 0.0–0.5)
Eosinophils Relative: 2 %
HCT: 35.3 % — ABNORMAL LOW (ref 36.0–46.0)
Hemoglobin: 11.2 g/dL — ABNORMAL LOW (ref 12.0–15.0)
Immature Granulocytes: 0 %
Lymphocytes Relative: 25 %
Lymphs Abs: 1.3 10*3/uL (ref 0.7–4.0)
MCH: 32.5 pg (ref 26.0–34.0)
MCHC: 31.7 g/dL (ref 30.0–36.0)
MCV: 102.3 fL — ABNORMAL HIGH (ref 80.0–100.0)
Monocytes Absolute: 0.5 10*3/uL (ref 0.1–1.0)
Monocytes Relative: 9 %
Neutro Abs: 3.2 10*3/uL (ref 1.7–7.7)
Neutrophils Relative %: 63 %
Platelets: 117 10*3/uL — ABNORMAL LOW (ref 150–400)
RBC: 3.45 MIL/uL — ABNORMAL LOW (ref 3.87–5.11)
RDW: 14 % (ref 11.5–15.5)
WBC: 5.1 10*3/uL (ref 4.0–10.5)
nRBC: 0 % (ref 0.0–0.2)

## 2022-01-16 LAB — FERRITIN: Ferritin: 99 ng/mL (ref 11–307)

## 2022-01-16 LAB — IRON AND TIBC
Iron: 61 ug/dL (ref 28–170)
Saturation Ratios: 22 % (ref 10.4–31.8)
TIBC: 275 ug/dL (ref 250–450)
UIBC: 214 ug/dL

## 2022-01-16 LAB — VITAMIN B12: Vitamin B-12: 894 pg/mL (ref 180–914)

## 2022-01-19 LAB — METHYLMALONIC ACID, SERUM: Methylmalonic Acid, Quantitative: 321 nmol/L (ref 0–378)

## 2022-01-24 ENCOUNTER — Inpatient Hospital Stay (HOSPITAL_BASED_OUTPATIENT_CLINIC_OR_DEPARTMENT_OTHER): Payer: Medicare Other | Admitting: Physician Assistant

## 2022-01-24 ENCOUNTER — Telehealth: Payer: Self-pay | Admitting: Physician Assistant

## 2022-01-24 DIAGNOSIS — D539 Nutritional anemia, unspecified: Secondary | ICD-10-CM | POA: Diagnosis not present

## 2022-01-24 DIAGNOSIS — D696 Thrombocytopenia, unspecified: Secondary | ICD-10-CM

## 2022-01-24 DIAGNOSIS — R791 Abnormal coagulation profile: Secondary | ICD-10-CM | POA: Diagnosis not present

## 2022-01-24 DIAGNOSIS — E538 Deficiency of other specified B group vitamins: Secondary | ICD-10-CM | POA: Diagnosis not present

## 2022-01-24 NOTE — Telephone Encounter (Signed)
Scheduled per 10/13 los, patient has been called and notified.

## 2022-01-24 NOTE — Progress Notes (Signed)
Virtual Visit via Telephone Note [facility]  I connected with Natasha Lawson  on 01/24/2022 at 1:31 PM by telephone and verified that I am speaking with the correct person using two identifiers.  Location: Patient: Home Provider: [facility]   I discussed the limitations, risks, security and privacy concerns of performing an evaluation and management service by telephone and the availability of in person appointments. I also discussed with the patient that there may be a patient responsible charge related to this service. The patient expressed understanding and agreed to proceed.  REASON FOR VISIT: Follow-up for supratherapeutic INR and macrocytic anemia with thrombocytopenia  CURRENT THERAPY: Warfarin  INTERVAL HISTORY: Natasha Lawson is contacted today for follow-up of her macrocytic anemia and thrombocytopenia.  She was last seen by Tarri Abernethy PA-C on 07/23/2021. At today's visit, she reports feeling about the same.  She is taking her vitamin B12 (500 mcg) and supplement an iron tablet daily.  She remains on warfarin for stroke prophylaxis in the setting of paroxysmal atrial fibrillation.  She is currently taking warfarin 2 mg, and reports that her last INR was 2.0 when it was checked yesterday.  She continues to report easy bruising.  She denies any bleeding such as epistaxis, hematemesis, hematochezia, melena, hematuria, or gum bleeding.  No B symptoms or recurrent infections. She reports 25% energy and 100% appetite    OBSERVATIONS/OBJECTIVE: Review of Systems  Constitutional:  Negative for chills, diaphoresis, fever, malaise/fatigue and weight loss.  Respiratory:  Positive for shortness of breath (With exertion). Negative for cough.   Cardiovascular:  Negative for chest pain and palpitations.  Gastrointestinal:  Negative for abdominal pain, blood in stool, melena, nausea and vomiting.  Neurological:  Positive for dizziness and tingling. Negative for  headaches.     PHYSICAL EXAM (per limitations of virtual telephone visit): The patient is alert and oriented x 3, exhibiting adequate mentation, good mood, and ability to speak in full sentences and execute sound judgement.   ASSESSMENT & PLAN: 1.  Macrocytic anemia & thrombocytopenia - She has a history of iron deficiency anemia, previously seen by Dr. Lonia Chimera [facility] - initial consult 12/26/2015). - She has received blood transfusions as well as IV iron in the past. 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 - She reports that she recently had Hemoccult stools checked and that they were negative. - She has not noticed any melena or hematochezia.   - She is taking iron supplement (Niferex) twice daily.   - She is taking vitamin B12 500 mcg daily.   - Abdominal ultrasound (07/09/2021) showed fatty liver disease.  Hepatic function panel was normal. - Per notes from [facility] hematology, she has had some mild thrombocytopenia and mild macrocytic anemia for some time, and considered bone marrow biopsy to rule out early MDS, but this was not pursued. - Her mild thrombocytopenia and mild macrocytosis may also be attributable to her fatty liver disease. - Most recent labs (01/16/2022): Hgb 11.2/MCV 102.3, platelets 117.  Vitamin B12 894 with normalization of methylmalonic acid.  Ferritin 99, iron saturation 22%. - PLAN: Blood counts are stable at baseline. - Continue daily B12 500 mcg and daily iron supplementation - Would consider bone marrow biopsy in the future if any significant deviations from baseline. - We will repeat labs with RTC (office visit) in 6 months.    2.  Supratherapeutic INR - Patient is seen at the  request of her cardiologist (Dr. Carrolyn Leigh) due to difficulty controlling INR and for evaluation of any other clotting abnormalities that could be predisposing patient to elevated INR - Patient takes warfarin as  stroke prophylaxis in the setting of paroxysmal atrial fibrillation. - She was previously on Xarelto and Eliquis, but experienced side effects of arthralgia and increased weakness. - Cardiologist has noted the patient has persistently elevated supratherapeutic INR despite currently being on low-dose warfarin. -  Currently taking 1.5 mg of warfarin and is compliant with taking it as prescribed.  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 - No current bright red blood per rectum or melena.   - No known history of liver disease.  Denies alcohol use. - She takes daily allopurinol for gout.  She rarely uses Tylenol. - Abdominal ultrasound (07/09/2021) showed fatty liver disease.  Hepatic function panel was normal. 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 - PLAN: Discussed with patient that her genetic make-up causes her to have increased sensitivity to warfarin, which explains why she is requiring low dose warfarin to avoid supratherapeutic INR. - Patient instructed to continue to follow-up with her cardiologist for INR checks and dose adjustments of warfarin.  If needed, she can go down to as little as 0.5 mg warfarin daily. -- Agree with switching to febuxostat instead of allopurinol.  Discussed that she should avoid medications such as allopurinol that would increase her warfarin sensitivity further.   3.  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  History of breast cancer in 2016 s/p lumpectomy and radiation. - SOCIAL: She lives at home with her husband.  She is mobile in wheelchair.  She previously worked in a [employer]. - FAMILY: She reports that her father died of lung cancer but had a strong history of tobacco use.  Her mother had heart disease.   FOLLOW UP INSTRUCTIONS: Labs in 6 months Office visit after labs    I discussed the assessment and treatment plan with the patient. The patient was provided an opportunity to ask questions and all were answered. The patient agreed with the plan and demonstrated an understanding of the instructions.   The patient was advised to call back or seek an in-person evaluation if the symptoms worsen or if the condition fails to improve as anticipated.  I provided 12 minutes of non-face-to-face time during this encounter.   Harriett Rush, PA-C 01/24/2022 2:11 PM

## 2022-07-18 ENCOUNTER — Inpatient Hospital Stay: Payer: PRIVATE HEALTH INSURANCE

## 2022-07-25 ENCOUNTER — Ambulatory Visit: Payer: PRIVATE HEALTH INSURANCE | Admitting: Physician Assistant

## 2023-04-26 ENCOUNTER — Other Ambulatory Visit: Payer: Self-pay

## 2023-04-26 ENCOUNTER — Emergency Department (HOSPITAL_COMMUNITY)
Admission: EM | Admit: 2023-04-26 | Discharge: 2023-04-26 | Disposition: A | Discharge status: Home / Self Care | Payer: Medicare Other | Attending: Emergency Medicine | Admitting: Emergency Medicine

## 2023-04-26 ENCOUNTER — Encounter (HOSPITAL_COMMUNITY): Payer: Self-pay

## 2023-04-26 DIAGNOSIS — M7989 Other specified soft tissue disorders: Secondary | ICD-10-CM | POA: Diagnosis present

## 2023-04-26 NOTE — ED Notes (Signed)
 Husband of patient apologized to me and thanked me for the care.

## 2023-04-26 NOTE — ED Provider Notes (Signed)
 [place] EMERGENCY DEPARTMENT AT [facility] Provider Note   CSN: [id number] Arrival date & time: 04/26/23  1038     History  Chief Complaint  Patient presents with   Abscess    Natasha Lawson is a 83 y.o. female status post ACDF 2019 who presents with cervical spine mass that she noticed yesterday along prior posterior scar.  Denies any pain, fevers or chills.  She states she feels completely fine otherwise.  There is no drainage from the mass.  Has any significant cervical spine pain.   Abscess      Home Medications Prior to Admission medications   Medication Sig Start Date End Date Taking? Authorizing Provider  febuxostat  (ULORIC ) 40 MG tablet Take 40 mg by mouth daily. 07/31/21   [provider]  ferrous sulfate  325 (65 FE) MG tablet Take 325 mg by mouth daily with breakfast.    [provider]  iron  polysaccharides (NIFEREX) 150 MG capsule Take 1 capsule (150 mg total) by mouth 2 (two) times daily with a meal. Patient taking differently: Take 150 mg by mouth 2 (two) times daily with a meal. With lunch and dinner 04/21/19   Love, Sharlet RAMAN, PA-C  Multiple Vitamins-Minerals (ONCOVITE) TABS Take by mouth.    [provider]  nebivolol  (BYSTOLIC ) 10 MG tablet Take 10 mg by mouth daily. 08/16/21   [provider]  omeprazole (PRILOSEC) 20 MG capsule Take 20 mg by mouth daily before breakfast. 12/14/18   [provider]  potassium chloride  (MICRO-K ) 10 MEQ CR capsule Take 10 mEq by mouth 2 (two) times daily. 11/09/18   [provider]  PRESCRIPTION MEDICATION Pt uses a cpap nightly    [provider]  sertraline  (ZOLOFT ) 100 MG tablet Take 100 mg by mouth daily with breakfast.  03/18/19   [provider]  torsemide  (DEMADEX ) 20 MG tablet Take 20 mg by mouth daily with breakfast.  12/21/18   [provider]  vitamin B-12 (CYANOCOBALAMIN ) 500 MCG tablet Take 500 mcg by mouth daily.    [provider]  Vitamin D , Ergocalciferol , (DRISDOL ) 1.25 MG (50000 UT) CAPS capsule Take 50,000 Units by mouth every Friday.  01/17/19   [provider]  warfarin (COUMADIN ) 2 MG tablet Take 2 mg by mouth at bedtime. 01/21/20   [provider]      Allergies    Capoten [captopril], Iodine, Norvasc [amlodipine besylate], Shellfish allergy, Terazosin hcl, Xarelto [rivaroxaban], Welchol [colesevelam hcl], Crestor [rosuvastatin calcium], Fenofibrate, Pravachol [pravastatin sodium], Spironolactone, and Statins    Review of Systems   Review of Systems  Musculoskeletal:  Negative for neck pain and neck stiffness.    Physical Exam Updated Vital Signs BP 136/67   Pulse 66   Temp 97.9 F (36.6 C) (Oral)   Resp 19   Ht 5' 7 (1.702 m)   Wt 103 kg   LMP  (LMP Unknown)   SpO2 98%   BMI 35.55 kg/m  Physical Exam Vitals and nursing note reviewed.  Constitutional:      General: She is not in acute distress.    Appearance: She is well-developed.  HENT:     Head: Normocephalic and atraumatic.  Eyes:     Conjunctiva/sclera: Conjunctivae normal.  Cardiovascular:     Rate and Rhythm: Normal rate and regular rhythm.     Heart sounds: No murmur heard. Pulmonary:     Effort: Pulmonary effort is normal. No respiratory distress.     Breath sounds:  Normal breath sounds.  Musculoskeletal:        General: No swelling.     Cervical back: Neck supple. No rigidity or tenderness.  Skin:    General: Skin is warm and dry.     Capillary Refill: Capillary refill takes less than 2 seconds.     Comments: Round fluctuant mass approximately 2 cm in diameter without overlying warmth or surrounding erythema, no crepitus or drainage.  Very mild tenderness to palpation  Neurological:     Mental Status: She is alert.  Psychiatric:        Mood and Affect: Mood normal.     ED Results / Procedures / Treatments   Labs (all labs ordered are listed, but only abnormal results are displayed) Labs Reviewed -  No data to display  EKG None  Radiology No results found.  Procedures .Incision and Drainage  Date/Time: 04/26/2023 2:47 PM  Performed by: Donnajean Lynwood DEL, PA-C Authorized by: Donnajean Lynwood DEL, PA-C   Consent:    Consent obtained:  Verbal   Consent given by:  Patient   Risks, benefits, and alternatives were discussed: yes     Risks discussed:  Incomplete drainage   Alternatives discussed:  No treatment Universal protocol:    Procedure explained and questions answered to patient or proxy's satisfaction: yes     Patient identity confirmed:  Verbally with patient and arm band Location:    Type:  Cyst   Location:  Neck Pre-procedure details:    Skin preparation:  Chlorhexidine  with alcohol Anesthesia:    Anesthesia method:  None Procedure details:    Needle aspiration: yes     Needle size:  18 G   Incision types:  Single straight   Drainage:  Bloody   Drainage amount:  Scant   Wound treatment:  Wound left open Post-procedure details:    Procedure completion:  Tolerated     Medications Ordered in ED Medications - No data to display  ED Course/ Medical Decision Making/ A&P                                 Medical Decision Making  This patient presents to the ED with chief complaint(s) of soft tissue mass.  The complaint involves an extensive differential diagnosis and also carries with it a high risk of complications and morbidity.   pertinent past medical history as listed in HPI  The differential diagnosis includes  Cellulitis, malignancy, seroma, cyst, lipoma, abscess The initial plan is to  Aspiration Additional history obtained: Additional history obtained from spouse Records reviewed Care Everywhere/External Records  Initial Assessment:   Patient is hemodynamically stable, nontoxic-appearing presenting with soft tissue mass that she noticed yesterday.  Mass is nonpainful.  She is otherwise asymptomatic.  There is no evidence of active infection.  Low  concern for malignancy given rapid onset.  Given location along surgical site suspicious for seroma.  However patient and her husband are in interested in aspiration with culture.  Will attempt this here at bedside.  Independent ECG interpretation:  None  Independent labs interpretation:  The following labs were independently interpreted:  None  Independent visualization and interpretation of imaging: none  Treatment and Reassessment: Needle aspiration was performed without significant fluid obtained.  Discussed discharge plan with referral to patient's surgeon and her primary care doctor.  Her and her husband are in agreement.  Consultations obtained:   None  Disposition:  Patient discharged home.  Encouraged to follow-up with her surgeon as well as primary care doctor. The patient has been appropriately medically screened and/or stabilized in the ED. I have low suspicion for any other emergent medical condition which would require further screening, evaluation or treatment in the ED or require inpatient management. At time of discharge the patient is hemodynamically stable and in no acute distress. I have discussed work-up results and diagnosis with patient and answered all questions. Patient is agreeable with discharge plan. We discussed strict return precautions for returning to the emergency department and they verbalized understanding.     Social Determinants of Health:   none  This note was dictated with voice recognition software.  Despite best efforts at proofreading, errors may have occurred which can change the documentation meaning.          Final Clinical Impression(s) / ED Diagnoses Final diagnoses:  Soft tissue mass    Rx / DC Orders ED Discharge Orders     None         Donnajean Lynwood VEAR DEVONNA 04/26/23 1450    Suzette Pac, MD 04/27/23 1456

## 2023-04-26 NOTE — Discharge Instructions (Addendum)
 It was a pleasure taking care of you today.  You were evaluated in the emergency room for soft tissue mass.  An attempt at aspiration was performed, however no significant amount of fluid was obtained.  As discussed the mass is not consistent with an infection or cancer.  I would recommend you follow-up with your surgeon as well as your primary care doctor.  Please call make an appointment within the next week.  If you experience any new or worsening symptoms including fevers, chills, extreme pain please return to the emergency room.

## 2023-04-26 NOTE — ED Notes (Signed)
 Pt husband came out of the room to express that he is very upset that they have not been seen yet.  He stated that the man across the room has already been seen and he can not understand why they have not.  I apologized to him and explained that people were not seen on a first come first serve basis but that his wife was very important and would be seen as soon as possible.  He then stated they had not even seen a nurse and I explained that I was their nurse and that I triaged them and saw them and was just waiting on the provider for orders.  The man then stated So you're telling me I made a bad choice by coming to a [facility].  I once again apologized that he felt that way and that a provider would take care of his wife as soon as possible.

## 2023-04-26 NOTE — ED Triage Notes (Addendum)
 Pt reports an abscess to the back of her neck over an old neck surgical scar.  Pt reports her watch told her she had an elevated heart rate twice yesterday.  Pt heart rate is regular and 65 in triage.

## 2023-05-08 ENCOUNTER — Other Ambulatory Visit (HOSPITAL_COMMUNITY): Payer: Self-pay | Admitting: Surgery

## 2023-05-08 ENCOUNTER — Ambulatory Visit (HOSPITAL_COMMUNITY)
Admission: RE | Admit: 2023-05-08 | Discharge: 2023-05-08 | Disposition: A | Discharge status: Home / Self Care | Payer: Medicare Other | Source: Ambulatory Visit | Attending: Surgery | Admitting: Surgery

## 2023-05-08 DIAGNOSIS — G959 Disease of spinal cord, unspecified: Secondary | ICD-10-CM

## 2023-05-10 ENCOUNTER — Ambulatory Visit (HOSPITAL_COMMUNITY)
Admission: RE | Admit: 2023-05-10 | Discharge: 2023-05-10 | Disposition: A | Discharge status: Home / Self Care | Payer: Medicare Other | Source: Ambulatory Visit | Attending: Surgery | Admitting: Surgery

## 2023-05-10 DIAGNOSIS — G959 Disease of spinal cord, unspecified: Secondary | ICD-10-CM | POA: Diagnosis present

## 2023-05-10 MED ORDER — GADOBUTROL 1 MMOL/ML IV SOLN
10.0000 mL | Freq: Once | INTRAVENOUS | Status: AC | PRN
  Administered 2023-05-10: 10 mL via INTRAVENOUS

## 2023-07-21 ENCOUNTER — Other Ambulatory Visit: Payer: Self-pay | Admitting: Neurosurgery

## 2023-08-05 NOTE — Progress Notes (Signed)
 Surgical Instructions   Your procedure is scheduled on Aug 13, 2023. Report to [facility] Main Entrance "A" at 8:30 A.M., then check in with the Admitting office. Any questions or running late day of surgery: call [phone]  Questions prior to your surgery date: call [phone], Monday-Friday, 8am-4pm. If you experience any cold or flu symptoms such as cough, fever, chills, shortness of breath, etc. between now and your scheduled surgery, please notify us  at the above number.     Remember:  Do not eat or drink after midnight the night before your surgery     Take these medicines the morning of surgery with A SIP OF WATER  amiodarone (PACERONE)  nebivolol  (BYSTOLIC )  omeprazole (PRILOSEC)  sertraline  (ZOLOFT )   May take these medicines IF NEEDED:    One week prior to surgery, STOP taking any Aspirin (unless otherwise instructed by your surgeon) Aleve, Naproxen, Ibuprofen, Motrin, Advil, Goody's, BC's, all herbal medications, fish oil, and non-prescription vitamins.                     Do NOT Smoke (Tobacco/Vaping) for 24 hours prior to your procedure.  If you use a CPAP at night, you may bring your mask/headgear for your overnight stay.   You will be asked to remove any contacts, glasses, piercing's, hearing aid's, dentures/partials prior to surgery. Please bring cases for these items if needed.    Patients discharged the day of surgery will not be allowed to drive home, and someone needs to stay with them for 24 hours.  SURGICAL WAITING ROOM VISITATION Patients may have no more than 2 support people in the waiting area - these visitors may rotate.   Pre-op nurse will coordinate an appropriate time for 1 ADULT support person, who may not rotate, to accompany patient in pre-op.  Children under the age of 35 must have an adult with them who is not the patient and must remain in the main waiting area with an adult.  If the patient needs to stay at the hospital during part of  their recovery, the visitor guidelines for inpatient rooms apply.  Please refer to the [facility] website for the visitor guidelines for any additional information.   If you received a COVID test during your pre-op visit  it is requested that you wear a mask when out in public, stay away from anyone that may not be feeling well and notify your surgeon if you develop symptoms. If you have been in contact with anyone that has tested positive in the last 10 days please notify you surgeon.      Pre-operative 5 CHG Bathing Instructions   You can play a key role in reducing the risk of infection after surgery. Your skin needs to be as free of germs as possible. You can reduce the number of germs on your skin by washing with CHG (chlorhexidine  gluconate) soap before surgery. CHG is an antiseptic soap that kills germs and continues to kill germs even after washing.   DO NOT use if you have an allergy to chlorhexidine /CHG or antibacterial soaps. If your skin becomes reddened or irritated, stop using the CHG and notify one of our RNs at [phone].   Please shower with the CHG soap starting 4 days before surgery using the following schedule:     Please keep in mind the following:  DO NOT shave, including legs and underarms, starting the day of your first shower.   You may shave your face  at any point before/day of surgery.  Place clean sheets on your bed the day you start using CHG soap. Use a clean washcloth (not used since being washed) for each shower. DO NOT sleep with pets once you start using the CHG.   CHG Shower Instructions:  Wash your face and private area with normal soap. If you choose to wash your hair, wash first with your normal shampoo.  After you use shampoo/soap, rinse your hair and body thoroughly to remove shampoo/soap residue.  Turn the water OFF and apply about 3 tablespoons (45 ml) of CHG soap to a CLEAN washcloth.  Apply CHG soap ONLY FROM YOUR NECK DOWN TO YOUR TOES  (washing for 3-5 minutes)  DO NOT use CHG soap on face, private areas, open wounds, or sores.  Pay special attention to the area where your surgery is being performed.  If you are having back surgery, having someone wash your back for you may be helpful. Wait 2 minutes after CHG soap is applied, then you may rinse off the CHG soap.  Pat dry with a clean towel  Put on clean clothes/pajamas   If you choose to wear lotion, please use ONLY the CHG-compatible lotions that are listed below.  Additional instructions for the day of surgery: DO NOT APPLY any lotions, deodorants, cologne, or perfumes.   Do not bring valuables to the hospital. [facility] is not responsible for any belongings/valuables. Do not wear nail polish, gel polish, artificial nails, or any other type of covering on natural nails (fingers and toes) Do not wear jewelry or makeup Put on clean/comfortable clothes.  Please brush your teeth.  Ask your nurse before applying any prescription medications to the skin.     CHG Compatible Lotions   Aveeno Moisturizing lotion  Cetaphil Moisturizing Cream  Cetaphil Moisturizing Lotion  Clairol Herbal Essence Moisturizing Lotion, Dry Skin  Clairol Herbal Essence Moisturizing Lotion, Extra Dry Skin  Clairol Herbal Essence Moisturizing Lotion, Normal Skin  Curel Age Defying Therapeutic Moisturizing Lotion with Alpha Hydroxy  Curel Extreme Care Body Lotion  Curel Soothing Hands Moisturizing Hand Lotion  Curel Therapeutic Moisturizing Cream, Fragrance-Free  Curel Therapeutic Moisturizing Lotion, Fragrance-Free  Curel Therapeutic Moisturizing Lotion, Original Formula  Eucerin Daily Replenishing Lotion  Eucerin Dry Skin Therapy Plus Alpha Hydroxy Crme  Eucerin Dry Skin Therapy Plus Alpha Hydroxy Lotion  Eucerin Original Crme  Eucerin Original Lotion  Eucerin Plus Crme Eucerin Plus Lotion  Eucerin TriLipid Replenishing Lotion  Keri Anti-Bacterial Hand Lotion  Keri Deep  Conditioning Original Lotion Dry Skin Formula Softly Scented  Keri Deep Conditioning Original Lotion, Fragrance Free Sensitive Skin Formula  Keri Lotion Fast Absorbing Fragrance Free Sensitive Skin Formula  Keri Lotion Fast Absorbing Softly Scented Dry Skin Formula  Keri Original Lotion  Keri Skin Renewal Lotion Keri Silky Smooth Lotion  Keri Silky Smooth Sensitive Skin Lotion  Nivea Body Creamy Conditioning Oil  Nivea Body Extra Enriched Lotion  Nivea Body Original Lotion  Nivea Body Sheer Moisturizing Lotion Nivea Crme  Nivea Skin Firming Lotion  NutraDerm 30 Skin Lotion  NutraDerm Skin Lotion  NutraDerm Therapeutic Skin Cream  NutraDerm Therapeutic Skin Lotion  ProShield Protective Hand Cream  Provon moisturizing lotion  Please read over the following fact sheets that you were given.

## 2023-08-06 ENCOUNTER — Encounter (HOSPITAL_COMMUNITY)
Admission: RE | Admit: 2023-08-06 | Discharge: 2023-08-06 | Disposition: A | Discharge status: Home / Self Care | Source: Ambulatory Visit | Attending: Neurosurgery | Admitting: Neurosurgery

## 2023-08-06 ENCOUNTER — Encounter (HOSPITAL_COMMUNITY): Payer: Self-pay

## 2023-08-06 ENCOUNTER — Other Ambulatory Visit: Payer: Self-pay

## 2023-08-06 VITALS — BP 135/105 | HR 53 | Temp 98.3°F | Resp 17 | Ht 67.0 in | Wt 225.0 lb

## 2023-08-06 DIAGNOSIS — Z853 Personal history of malignant neoplasm of breast: Secondary | ICD-10-CM | POA: Diagnosis not present

## 2023-08-06 DIAGNOSIS — I872 Venous insufficiency (chronic) (peripheral): Secondary | ICD-10-CM | POA: Diagnosis not present

## 2023-08-06 DIAGNOSIS — G4733 Obstructive sleep apnea (adult) (pediatric): Secondary | ICD-10-CM | POA: Diagnosis not present

## 2023-08-06 DIAGNOSIS — Z9181 History of falling: Secondary | ICD-10-CM | POA: Insufficient documentation

## 2023-08-06 DIAGNOSIS — I110 Hypertensive heart disease with heart failure: Secondary | ICD-10-CM | POA: Insufficient documentation

## 2023-08-06 DIAGNOSIS — Z981 Arthrodesis status: Secondary | ICD-10-CM | POA: Insufficient documentation

## 2023-08-06 DIAGNOSIS — Z01812 Encounter for preprocedural laboratory examination: Secondary | ICD-10-CM | POA: Diagnosis present

## 2023-08-06 DIAGNOSIS — L089 Local infection of the skin and subcutaneous tissue, unspecified: Secondary | ICD-10-CM | POA: Insufficient documentation

## 2023-08-06 DIAGNOSIS — T8463XA Infection and inflammatory reaction due to internal fixation device of spine, initial encounter: Secondary | ICD-10-CM | POA: Insufficient documentation

## 2023-08-06 DIAGNOSIS — Z79899 Other long term (current) drug therapy: Secondary | ICD-10-CM | POA: Insufficient documentation

## 2023-08-06 DIAGNOSIS — I4891 Unspecified atrial fibrillation: Secondary | ICD-10-CM | POA: Insufficient documentation

## 2023-08-06 DIAGNOSIS — Y792 Prosthetic and other implants, materials and accessory orthopedic devices associated with adverse incidents: Secondary | ICD-10-CM | POA: Diagnosis not present

## 2023-08-06 DIAGNOSIS — Z01818 Encounter for other preprocedural examination: Secondary | ICD-10-CM

## 2023-08-06 DIAGNOSIS — I2510 Atherosclerotic heart disease of native coronary artery without angina pectoris: Secondary | ICD-10-CM | POA: Insufficient documentation

## 2023-08-06 DIAGNOSIS — Z7409 Other reduced mobility: Secondary | ICD-10-CM | POA: Diagnosis not present

## 2023-08-06 DIAGNOSIS — Z955 Presence of coronary angioplasty implant and graft: Secondary | ICD-10-CM | POA: Diagnosis not present

## 2023-08-06 DIAGNOSIS — Z923 Personal history of irradiation: Secondary | ICD-10-CM | POA: Insufficient documentation

## 2023-08-06 DIAGNOSIS — D649 Anemia, unspecified: Secondary | ICD-10-CM | POA: Diagnosis not present

## 2023-08-06 DIAGNOSIS — I5032 Chronic diastolic (congestive) heart failure: Secondary | ICD-10-CM | POA: Insufficient documentation

## 2023-08-06 DIAGNOSIS — Z7901 Long term (current) use of anticoagulants: Secondary | ICD-10-CM | POA: Insufficient documentation

## 2023-08-06 DIAGNOSIS — K219 Gastro-esophageal reflux disease without esophagitis: Secondary | ICD-10-CM | POA: Insufficient documentation

## 2023-08-06 LAB — CBC
HCT: 34.5 % — ABNORMAL LOW (ref 36.0–46.0)
Hemoglobin: 10.6 g/dL — ABNORMAL LOW (ref 12.0–15.0)
MCH: 31.6 pg (ref 26.0–34.0)
MCHC: 30.7 g/dL (ref 30.0–36.0)
MCV: 103 fL — ABNORMAL HIGH (ref 80.0–100.0)
Platelets: 155 10*3/uL (ref 150–400)
RBC: 3.35 MIL/uL — ABNORMAL LOW (ref 3.87–5.11)
RDW: 14.2 % (ref 11.5–15.5)
WBC: 5.2 10*3/uL (ref 4.0–10.5)
nRBC: 0 % (ref 0.0–0.2)

## 2023-08-06 LAB — BASIC METABOLIC PANEL WITH GFR
Anion gap: 9 (ref 5–15)
BUN: 25 mg/dL — ABNORMAL HIGH (ref 8–23)
CO2: 29 mmol/L (ref 22–32)
Calcium: 8.9 mg/dL (ref 8.9–10.3)
Chloride: 104 mmol/L (ref 98–111)
Creatinine, Ser: 1.31 mg/dL — ABNORMAL HIGH (ref 0.44–1.00)
GFR, Estimated: 41 mL/min — ABNORMAL LOW (ref >60)
Glucose, Bld: 110 mg/dL — ABNORMAL HIGH (ref 70–99)
Potassium: 4.8 mmol/L (ref 3.5–5.1)
Sodium: 142 mmol/L (ref 135–145)

## 2023-08-06 LAB — SURGICAL PCR SCREEN
MRSA, PCR: NEGATIVE
Staphylococcus aureus: NEGATIVE

## 2023-08-06 NOTE — Progress Notes (Signed)
 Surgical Instructions   Your procedure is scheduled on Aug 13, 2023. Report to [facility] Main Entrance "A" at 8:30 A.M., then check in with the Admitting office. Any questions or running late day of surgery: call [phone]  Questions prior to your surgery date: call [phone], Monday-Friday, 8am-4pm. If you experience any cold or flu symptoms such as cough, fever, chills, shortness of breath, etc. between now and your scheduled surgery, please notify us  at the above number.     Remember:  Do not eat or drink after midnight the night before your surgery     Take these medicines the morning of surgery with A SIP OF WATER  amiodarone (PACERONE)  nebivolol  (BYSTOLIC )  omeprazole (PRILOSEC)  sertraline  (ZOLOFT )   Follow your surgeon's instructions on when to stop Asprin.  If no instructions were given by your surgeon then you will need to call the office to get those instructions.    FOLLOW YOUR SURGEON'S INSTRUCTIONS REGARDING YOUR COUMADIN . IF NO INSTRUCTIONS WERE GIVEN, YOU WILL NEED TO CALL THE OFFICE TO GET THOSE INSTRUCTIONS.   One week prior to surgery, STOP taking any Aleve, Naproxen, Ibuprofen, Motrin, Advil, Goody's, BC's, all herbal medications, fish oil, and non-prescription vitamins.                     Do NOT Smoke (Tobacco/Vaping) for 24 hours prior to your procedure.  If you use a CPAP at night, you may bring your mask/headgear for your overnight stay.   You will be asked to remove any contacts, glasses, piercing's, hearing aid's, dentures/partials prior to surgery. Please bring cases for these items if needed.    Patients discharged the day of surgery will not be allowed to drive home, and someone needs to stay with them for 24 hours.  SURGICAL WAITING ROOM VISITATION Patients may have no more than 2 support people in the waiting area - these visitors may rotate.   Pre-op nurse will coordinate an appropriate time for 1 ADULT support person, who may not rotate,  to accompany patient in pre-op.  Children under the age of 3 must have an adult with them who is not the patient and must remain in the main waiting area with an adult.  If the patient needs to stay at the hospital during part of their recovery, the visitor guidelines for inpatient rooms apply.  Please refer to the [facility] website for the visitor guidelines for any additional information.   If you received a COVID test during your pre-op visit  it is requested that you wear a mask when out in public, stay away from anyone that may not be feeling well and notify your surgeon if you develop symptoms. If you have been in contact with anyone that has tested positive in the last 10 days please notify you surgeon.      Pre-operative 5 CHG Bathing Instructions   You can play a key role in reducing the risk of infection after surgery. Your skin needs to be as free of germs as possible. You can reduce the number of germs on your skin by washing with CHG (chlorhexidine  gluconate) soap before surgery. CHG is an antiseptic soap that kills germs and continues to kill germs even after washing.   DO NOT use if you have an allergy to chlorhexidine /CHG or antibacterial soaps. If your skin becomes reddened or irritated, stop using the CHG and notify one of our RNs at [phone].   Please shower with the CHG  soap starting 4 days before surgery using the following schedule:     Please keep in mind the following:  DO NOT shave, including legs and underarms, starting the day of your first shower.   You may shave your face at any point before/day of surgery.  Place clean sheets on your bed the day you start using CHG soap. Use a clean washcloth (not used since being washed) for each shower. DO NOT sleep with pets once you start using the CHG.   CHG Shower Instructions:  Wash your face and private area with normal soap. If you choose to wash your hair, wash first with your normal shampoo.  After you  use shampoo/soap, rinse your hair and body thoroughly to remove shampoo/soap residue.  Turn the water OFF and apply about 3 tablespoons (45 ml) of CHG soap to a CLEAN washcloth.  Apply CHG soap ONLY FROM YOUR NECK DOWN TO YOUR TOES (washing for 3-5 minutes)  DO NOT use CHG soap on face, private areas, open wounds, or sores.  Pay special attention to the area where your surgery is being performed.  If you are having back surgery, having someone wash your back for you may be helpful. Wait 2 minutes after CHG soap is applied, then you may rinse off the CHG soap.  Pat dry with a clean towel  Put on clean clothes/pajamas   If you choose to wear lotion, please use ONLY the CHG-compatible lotions that are listed below.  Additional instructions for the day of surgery: DO NOT APPLY any lotions, deodorants, cologne, or perfumes.   Do not bring valuables to the hospital. [facility] is not responsible for any belongings/valuables. Do not wear nail polish, gel polish, artificial nails, or any other type of covering on natural nails (fingers and toes) Do not wear jewelry or makeup Put on clean/comfortable clothes.  Please brush your teeth.  Ask your nurse before applying any prescription medications to the skin.     CHG Compatible Lotions   Aveeno Moisturizing lotion  Cetaphil Moisturizing Cream  Cetaphil Moisturizing Lotion  Clairol Herbal Essence Moisturizing Lotion, Dry Skin  Clairol Herbal Essence Moisturizing Lotion, Extra Dry Skin  Clairol Herbal Essence Moisturizing Lotion, Normal Skin  Curel Age Defying Therapeutic Moisturizing Lotion with Alpha Hydroxy  Curel Extreme Care Body Lotion  Curel Soothing Hands Moisturizing Hand Lotion  Curel Therapeutic Moisturizing Cream, Fragrance-Free  Curel Therapeutic Moisturizing Lotion, Fragrance-Free  Curel Therapeutic Moisturizing Lotion, Original Formula  Eucerin Daily Replenishing Lotion  Eucerin Dry Skin Therapy Plus Alpha Hydroxy Crme   Eucerin Dry Skin Therapy Plus Alpha Hydroxy Lotion  Eucerin Original Crme  Eucerin Original Lotion  Eucerin Plus Crme Eucerin Plus Lotion  Eucerin TriLipid Replenishing Lotion  Keri Anti-Bacterial Hand Lotion  Keri Deep Conditioning Original Lotion Dry Skin Formula Softly Scented  Keri Deep Conditioning Original Lotion, Fragrance Free Sensitive Skin Formula  Keri Lotion Fast Absorbing Fragrance Free Sensitive Skin Formula  Keri Lotion Fast Absorbing Softly Scented Dry Skin Formula  Keri Original Lotion  Keri Skin Renewal Lotion Keri Silky Smooth Lotion  Keri Silky Smooth Sensitive Skin Lotion  Nivea Body Creamy Conditioning Oil  Nivea Body Extra Enriched Teacher, adult education Moisturizing Lotion Nivea Crme  Nivea Skin Firming Lotion  NutraDerm 30 Skin Lotion  NutraDerm Skin Lotion  NutraDerm Therapeutic Skin Cream  NutraDerm Therapeutic Skin Lotion  ProShield Protective Hand Cream  Provon moisturizing lotion  Please read over the following fact sheets  that you were given.

## 2023-08-06 NOTE — Progress Notes (Signed)
 PCP - Kenney Peacemaker, NP Cardiologist -  Dr. Arvella Latina  PPM/ICD - denies Device Orders - na Rep Notified - na  Chest x-ray - na EKG - 04/06/2024, fax requested Stress Test - 07/14/2017 ECHO - 02/18/2020 Cardiac Cath - 12/10/2020  Sleep Study - sleep apnea CPAP - nightly  Non-diabetic  Blood Thinner Instructions: Coumadin , instructed to stop today 08/06/2023 Aspirin Instructions:ASA, instructed to stop 08/06/2023  ERAS Protcol -NPO  Anesthesia review: Yes. HTN, A-Fib, OSA-CPAP  Patient denies shortness of breath, fever, cough and chest pain at PAT appointment   All instructions explained to the patient, with a verbal understanding of the material. Patient agrees to go over the instructions while at home for a better understanding. Patient also instructed to self quarantine after being tested for COVID-19. The opportunity to ask questions was provided.

## 2023-08-07 NOTE — Anesthesia Preprocedure Evaluation (Addendum)
 Anesthesia Evaluation  Patient identified by MRN, date of birth, ID band Patient awake    Reviewed: Allergy & Precautions, NPO status , Patient's Chart, lab work & pertinent test results, reviewed documented beta blocker date and time   History of Anesthesia Complications Negative for: history of anesthetic complications  Airway Mallampati: III       Dental no notable dental hx.    Pulmonary sleep apnea    breath sounds clear to auscultation       Cardiovascular hypertension, (-) angina + CAD and + Cardiac Stents  (-) Past MI, (-) CABG and (-) Peripheral Vascular Disease + dysrhythmias Atrial Fibrillation  Rhythm:Regular Rate:Normal     Neuro/Psych neg Seizures  Anxiety        GI/Hepatic ,GERD  Medicated and Controlled,,(+) neg Cirrhosis        Endo/Other    Renal/GU Renal disease     Musculoskeletal  (+) Arthritis , Osteoarthritis,    Abdominal   Peds  Hematology  (+) Blood dyscrasia, anemia   Anesthesia Other Findings   Reproductive/Obstetrics                              Anesthesia Physical Anesthesia Plan  ASA: 3  Anesthesia Plan: General   Post-op Pain Management:    Induction: Intravenous  PONV Risk Score and Plan: 2 and Ondansetron  and Dexamethasone   Airway Management Planned: Oral ETT and Video Laryngoscope Planned  Additional Equipment:   Intra-op Plan:   Post-operative Plan: Extubation in OR  Informed Consent: I have reviewed the patients History and Physical, chart, labs and discussed the procedure including the risks, benefits and alternatives for the proposed anesthesia with the patient or authorized representative who has indicated his/her understanding and acceptance.     Dental advisory given  Plan Discussed with:   Anesthesia Plan Comments: (PAT note by Rudy Costain, PA-C:  83 year old female with pertinent history including HTN, CAD (DES pLAD  01/02/15, patent stent 12/10/20), atrial fibrillation, chronic diastolic CHF, GERD, anemia, OSA (uses CPAP), right breast cancer (lumpectomy 08/14/14, 10/05/16, radiation 2016), C5-7 anterior fusion 11/16/2017 (complicated by "quadriplegia", improved; had subsequent posterior fusion C3-C7, now with indolent wound infection requiring revision/hardware removal), venous insufficiency.  Follows with cardiologist Dr. Ginny Lair for history of CAD, HFpEF, and atrial fibrillation.  Recently, patient has reported more frequent breakthrough episodes of A-fib, particularly at night. She is currently on amiodarone  200 mg daily and Coumadin  for anticoagulation.  When last seen on 06/08/2023, it was discussed that she needs to have her cervical spine infection addressed before potentially considering atrial fibrillation ablation.  Recent fall 06/28/2023, seen in the ED and diagnosed with hamstring injury/avulsion fracture of the left ischium.  Did not pursue surgery because she is not ambulatory.  Patient reports last dose Coumadin  08/06/2023.  Preop labs reviewed, creatinine mildly elevated to 1.31, mild anemia hemoglobin 10.6, otherwise unremarkable.  EKG 05/12/2023 (Care Everywhere, tracing requested): Atrial fibrillation.  Ventricular rate 83.  The following studies are outlined in cardiology notes in care everywhere: 12/10/2020 R/LHC:  patent stent LAD, 40% pRCA, 40% mRCA, LVEDP 12, PCWP 10, PA 31/10/19, RV 31/4, RA 5, CO 6.6, CI 3.2    02/24/2020 adenosine MPI:  EF 67%, normal review   02/18/2020 echo:  EF 60 65%, moderate left atrial dilatation, trivial TR   )         Anesthesia Quick Evaluation

## 2023-08-07 NOTE — Progress Notes (Signed)
 Anesthesia Chart Review:  83 year old female with pertinent history including HTN, CAD (DES pLAD 01/02/15, patent stent 12/10/20), atrial fibrillation, chronic diastolic CHF, GERD, anemia, OSA (uses CPAP), right breast cancer (lumpectomy 08/14/14, 10/05/16, radiation 2016), C5-7 anterior fusion 11/16/2017 (complicated by "quadriplegia", improved; had subsequent posterior fusion C3-C7, now with indolent wound infection requiring revision/hardware removal), venous insufficiency.  Follows with cardiologist Dr. Ginny Lair for history of CAD, HFpEF, and atrial fibrillation.  Recently, patient has reported more frequent breakthrough episodes of A-fib, particularly at night. She is currently on amiodarone 200 mg daily and Coumadin  for anticoagulation.  When last seen on 06/08/2023, it was discussed that she needs to have her cervical spine infection addressed before potentially considering atrial fibrillation ablation.  Recent fall 06/28/2023, seen in the ED and diagnosed with hamstring injury/avulsion fracture of the left ischium.  Did not pursue surgery because she is not ambulatory.  Patient reports last dose Coumadin  08/06/2023.  Preop labs reviewed, creatinine mildly elevated to 1.31, mild anemia hemoglobin 10.6, otherwise unremarkable.  EKG 05/12/2023 (Care Everywhere, tracing requested): Atrial fibrillation.  Ventricular rate 83.  The following studies are outlined in cardiology notes in care everywhere: 12/10/2020 R/LHC:  patent stent LAD, 40% pRCA, 40% mRCA, LVEDP 12, PCWP 10, PA 31/10/19, RV 31/4, RA 5, CO 6.6, CI 3.2    02/24/2020 adenosine MPI:  EF 67%, normal review   02/18/2020 echo:  EF 60 65%, moderate left atrial dilatation, trivial TR     Edilia Gordon [facility] Short Stay Center/Anesthesiology Phone [phone] 08/07/2023 3:32 PM

## 2023-08-12 NOTE — Progress Notes (Signed)
 Patient was called to be informed that the surgery time for tomorrow was changed to 07:30 o'clock. Patient verbalized that she is aware of this change, and she will be at the hospital at 05:30 o'clock.

## 2023-08-12 NOTE — H&P (Addendum)
 CC: wound opening  HPI:      Patient has a history multiple posterior cervical surgeries with the most recent being PCDF C3-5 in July of 2023 by Dr. Ali Antonio. She has also noticed a bump to the inferior portion of her incision early Jan 2025. She denies any recent fevers, fatigue, malaise. She recently completed a course of antibiotics prescribed by her primary care provider. Despite this, her wound is not healing.      Patient Active Problem List   Diagnosis Date Noted   Thrombocytopenia (HCC)    Iron  deficiency anemia    Steroid-induced hyperglycemia    HTN (hypertension) 04/05/2019   Quadriplegia (HCC) 04/05/2019   Atrial fibrillation (HCC) 04/05/2019   Anticoagulated on Coumadin  04/05/2019   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   Cervical myelopathy (HCC) 03/29/2019   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   History of blood transfusion    Hypertension    Sleep apnea    uses CPAP nightly    Past Surgical History:  Procedure Laterality Date   ABDOMINAL HYSTERECTOMY     BREAST SURGERY Right 08/14/2014   lumpectomy/lynph nodes   BREAST SURGERY Right 10/05/2016   mass removed   CARDIAC CATHETERIZATION     CARPAL TUNNEL RELEASE Bilateral 10/01/11, 12/02/11   CHOLECYSTECTOMY  1987   COLONOSCOPY     CORONARY ANGIOPLASTY  01/02/2015   DES proximal LAD   DILATION AND CURETTAGE OF UTERUS  10/10/2008   hysteroscopy with biopsy   EYE SURGERY Bilateral    removed cataracts   HYSTEROSCOPY WITH D & C  08/19/2011   INCONTINENCE SURGERY  01/11/2007   JOINT REPLACEMENT Right 10/18/2013   knee   laparoscopy bilateral salpingo-oophorectomy  01/27/2012   NECK SURGERY  11/16/2017   POSTERIOR CERVICAL FUSION/FORAMINOTOMY N/A 03/29/2019   Procedure:  Cervical Five to Cervical Seven Posterior cervical laminectomy and instrumented fusion;  Surgeon: Cannon Champion, MD;  Location: [facility] OR;  Service: Neurosurgery;  Laterality: N/A;  Cervical Five to Cervical Seven Posterior cervical laminectomy and instrumented fusion   POSTERIOR CERVICAL FUSION/FORAMINOTOMY N/A 10/29/2021   Procedure: Cervical three, Cervical four Laminectomies with Cervical three-Cervical five posterior instrumented fusion;  Surgeon: Cannon Champion, MD;  Location: [facility] OR;  Service: Neurosurgery;  Laterality: N/A;   radiation breast canceer Right    from 12/08/14-01/12/15   rotator cuff  Right 04/26/2010   repair   TONSILLECTOMY  1954   TUBAL LIGATION  1984   UPPER GI ENDOSCOPY  2016    No medications prior to admission.   Allergies  Allergen Reactions   Capoten [Captopril] Swelling    Tongue    Iodine Hives   Norvasc [Amlodipine Besylate] Swelling   Shellfish Allergy Hives   Terazosin Hcl Other (See Comments)    Caused a-fib   Xarelto [Rivaroxaban] Other (See Comments)    Severe back pain   Welchol [Colesevelam Hcl] Other (See Comments)    Confusion    Crestor [Rosuvastatin Calcium] Other (See Comments)    Muscle aches   Fenofibrate Other (See Comments)    Muscle aches   Metoprolol Palpitations   Pravachol [Pravastatin Sodium] Other (See Comments)  fatigue   Spironolactone Diarrhea   Statins Other (See Comments)    Joint pain    Social History   Tobacco Use   Smoking status: Never   Smokeless tobacco: Never  Substance Use Topics   Alcohol use: Never    Family History  Problem Relation Age of Onset   Heart disease Mother    Cancer - Lung Father    Cancer Sister      Review of Systems Pertinent items are noted in HPI.  Objective:   No data found. No intake/output data recorded. No intake/output data recorded.      General : Alert, cooperative, no distress, appears stated age   Head:  Normocephalic/atraumatic    Eyes: PERRL,  conjunctiva/corneas clear, EOM's intact. Fundi could not be visualized Neck: dehiscence at inferior aspect of wound with epithelialized drainage tract. Chest:  Respirations unlabored Chest wall: no tenderness or deformity Heart: Regular rate and rhythm Abdomen: Soft, nontender and nondistended Extremities: warm and well-perfused Skin: normal turgor, color and texture Neurologic:  Alert, oriented x 3.  Eyes open spontaneously. PERRL, EOMI, VFC, no facial droop. V1-3 intact.  No dysarthria, tongue protrusion symmetric.  CNII-XII intact.  4/5 strength throughout       Data ReviewCBC:  Lab Results  Component Value Date   WBC 5.2 08/06/2023   RBC 3.35 (L) 08/06/2023   BMP:  Lab Results  Component Value Date   GLUCOSE 110 (H) 08/06/2023   CO2 29 08/06/2023   BUN 25 (H) 08/06/2023   CREATININE 1.31 (H) 08/06/2023   CALCIUM 8.9 08/06/2023   Radiology review:  See clinic note for details  Assessment:   This is a 83 year old woman with history of cervical corpectomy and multiple posterior fusion surgeries with Dr. Ali Antonio including C3-7 posterior fusion.I had a long discussion with the patient. At this point, with her wound not demonstrating healing and with concern for indolent infection, I am recommending posterior cervical wound revision including removal of posterior cervical instrumentation. 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 She potentially may need a small wound VAC depending on the health of the tissue around her chronic wound. I discussed with her the general technique of surgery, as well as risks, benefits, alternatives, and expected convalescence. Risks discussed included, but were not limited to, bleeding, pain, infection, scar, instability, damage to  nearby organs, spinal fluid leak, neurologic deficit, and death. Informed consent was obtained and the patient wished to proceed with surgery. All questions and concerns were answered.

## 2023-08-13 ENCOUNTER — Encounter (HOSPITAL_COMMUNITY)
Admission: RE | Disposition: A | Discharge status: Home / Self Care | Payer: Self-pay | Source: Home / Self Care | Attending: Neurosurgery

## 2023-08-13 ENCOUNTER — Inpatient Hospital Stay (HOSPITAL_COMMUNITY): Payer: Self-pay | Admitting: Physician Assistant

## 2023-08-13 ENCOUNTER — Inpatient Hospital Stay (HOSPITAL_COMMUNITY): Admitting: Anesthesiology

## 2023-08-13 ENCOUNTER — Other Ambulatory Visit: Payer: Self-pay

## 2023-08-13 ENCOUNTER — Inpatient Hospital Stay (HOSPITAL_COMMUNITY)

## 2023-08-13 ENCOUNTER — Inpatient Hospital Stay (HOSPITAL_COMMUNITY)
Admission: RE | Admit: 2023-08-13 | Discharge: 2023-08-18 | DRG: 857 | Disposition: A | Discharge status: Home / Self Care | Payer: PRIVATE HEALTH INSURANCE | Attending: Neurosurgery | Admitting: Neurosurgery

## 2023-08-13 DIAGNOSIS — Z90722 Acquired absence of ovaries, bilateral: Secondary | ICD-10-CM

## 2023-08-13 DIAGNOSIS — B9561 Methicillin susceptible Staphylococcus aureus infection as the cause of diseases classified elsewhere: Secondary | ICD-10-CM | POA: Diagnosis present

## 2023-08-13 DIAGNOSIS — M960 Pseudarthrosis after fusion or arthrodesis: Secondary | ICD-10-CM | POA: Diagnosis present

## 2023-08-13 DIAGNOSIS — A498 Other bacterial infections of unspecified site: Principal | ICD-10-CM | POA: Insufficient documentation

## 2023-08-13 DIAGNOSIS — I4891 Unspecified atrial fibrillation: Secondary | ICD-10-CM

## 2023-08-13 DIAGNOSIS — T8131XA Disruption of external operation (surgical) wound, not elsewhere classified, initial encounter: Principal | ICD-10-CM | POA: Diagnosis present

## 2023-08-13 DIAGNOSIS — B999 Unspecified infectious disease: Secondary | ICD-10-CM | POA: Diagnosis not present

## 2023-08-13 DIAGNOSIS — Z9842 Cataract extraction status, left eye: Secondary | ICD-10-CM | POA: Diagnosis not present

## 2023-08-13 DIAGNOSIS — Z955 Presence of coronary angioplasty implant and graft: Secondary | ICD-10-CM | POA: Diagnosis not present

## 2023-08-13 DIAGNOSIS — A4901 Methicillin susceptible Staphylococcus aureus infection, unspecified site: Secondary | ICD-10-CM

## 2023-08-13 DIAGNOSIS — Z8249 Family history of ischemic heart disease and other diseases of the circulatory system: Secondary | ICD-10-CM | POA: Diagnosis not present

## 2023-08-13 DIAGNOSIS — Z981 Arthrodesis status: Secondary | ICD-10-CM

## 2023-08-13 DIAGNOSIS — I10 Essential (primary) hypertension: Secondary | ICD-10-CM | POA: Diagnosis present

## 2023-08-13 DIAGNOSIS — I2510 Atherosclerotic heart disease of native coronary artery without angina pectoris: Secondary | ICD-10-CM

## 2023-08-13 DIAGNOSIS — Z9079 Acquired absence of other genital organ(s): Secondary | ICD-10-CM

## 2023-08-13 DIAGNOSIS — Z7901 Long term (current) use of anticoagulants: Secondary | ICD-10-CM

## 2023-08-13 DIAGNOSIS — K219 Gastro-esophageal reflux disease without esophagitis: Secondary | ICD-10-CM | POA: Diagnosis present

## 2023-08-13 DIAGNOSIS — M1990 Unspecified osteoarthritis, unspecified site: Secondary | ICD-10-CM | POA: Diagnosis present

## 2023-08-13 DIAGNOSIS — Z91013 Allergy to seafood: Secondary | ICD-10-CM

## 2023-08-13 DIAGNOSIS — Z9841 Cataract extraction status, right eye: Secondary | ICD-10-CM

## 2023-08-13 DIAGNOSIS — B9620 Unspecified Escherichia coli [E. coli] as the cause of diseases classified elsewhere: Secondary | ICD-10-CM | POA: Diagnosis present

## 2023-08-13 DIAGNOSIS — T8142XA Infection following a procedure, deep incisional surgical site, initial encounter: Principal | ICD-10-CM | POA: Diagnosis present

## 2023-08-13 DIAGNOSIS — Z90710 Acquired absence of both cervix and uterus: Secondary | ICD-10-CM

## 2023-08-13 DIAGNOSIS — Z91041 Radiographic dye allergy status: Secondary | ICD-10-CM

## 2023-08-13 DIAGNOSIS — Z9049 Acquired absence of other specified parts of digestive tract: Secondary | ICD-10-CM | POA: Diagnosis not present

## 2023-08-13 DIAGNOSIS — M47812 Spondylosis without myelopathy or radiculopathy, cervical region: Secondary | ICD-10-CM | POA: Diagnosis present

## 2023-08-13 DIAGNOSIS — Z79899 Other long term (current) drug therapy: Secondary | ICD-10-CM | POA: Diagnosis not present

## 2023-08-13 DIAGNOSIS — Y839 Surgical procedure, unspecified as the cause of abnormal reaction of the patient, or of later complication, without mention of misadventure at the time of the procedure: Secondary | ICD-10-CM | POA: Diagnosis present

## 2023-08-13 DIAGNOSIS — T8131XD Disruption of external operation (surgical) wound, not elsewhere classified, subsequent encounter: Secondary | ICD-10-CM | POA: Diagnosis not present

## 2023-08-13 DIAGNOSIS — G4730 Sleep apnea, unspecified: Secondary | ICD-10-CM | POA: Diagnosis present

## 2023-08-13 DIAGNOSIS — Z853 Personal history of malignant neoplasm of breast: Secondary | ICD-10-CM

## 2023-08-13 DIAGNOSIS — Z888 Allergy status to other drugs, medicaments and biological substances status: Secondary | ICD-10-CM

## 2023-08-13 DIAGNOSIS — H9191 Unspecified hearing loss, right ear: Secondary | ICD-10-CM | POA: Diagnosis present

## 2023-08-13 DIAGNOSIS — F419 Anxiety disorder, unspecified: Secondary | ICD-10-CM | POA: Diagnosis present

## 2023-08-13 HISTORY — PX: REMOVAL POSTERIOR SPINAL INSTRUMENTATION: SHX7452

## 2023-08-13 LAB — PROTIME-INR
INR: 1.2 (ref 0.8–1.2)
Prothrombin Time: 15.8 s — ABNORMAL HIGH (ref 11.4–15.2)

## 2023-08-13 LAB — APTT: aPTT: 29 s (ref 24–36)

## 2023-08-13 SURGERY — REMOVAL POSTERIOR SPINAL INSTRUMENTATION
Anesthesia: General

## 2023-08-13 MED ORDER — ACETAMINOPHEN 325 MG PO TABS
650.0000 mg | ORAL_TABLET | ORAL | Status: DC | PRN
  Administered 2023-08-14 – 2023-08-15 (×2): 650 mg via ORAL
  Filled 2023-08-13 (×2): qty 2

## 2023-08-13 MED ORDER — VANCOMYCIN HCL 1 G IV SOLR: INTRAVENOUS | Status: DC | PRN

## 2023-08-13 MED ORDER — FENTANYL CITRATE (PF) 100 MCG/2ML IJ SOLN
25.0000 ug | INTRAMUSCULAR | Status: DC | PRN
  Administered 2023-08-13: 25 ug via INTRAVENOUS

## 2023-08-13 MED ORDER — PROPOFOL 10 MG/ML IV BOLUS
INTRAVENOUS | Status: DC | PRN
  Administered 2023-08-13: 30 mg via INTRAVENOUS
  Administered 2023-08-13: 100 mg via INTRAVENOUS

## 2023-08-13 MED ORDER — EPHEDRINE 5 MG/ML INJ
INTRAVENOUS | Status: AC
  Filled 2023-08-13: qty 5

## 2023-08-13 MED ORDER — FLEET ENEMA RE ENEM: 1.0000 | ENEMA | Freq: Once | RECTAL | Status: DC | PRN

## 2023-08-13 MED ORDER — FENTANYL CITRATE (PF) 250 MCG/5ML IJ SOLN
INTRAMUSCULAR | Status: AC
  Filled 2023-08-13: qty 5

## 2023-08-13 MED ORDER — PHENOL 1.4 % MT LIQD: 1.0000 | OROMUCOSAL | Status: DC | PRN

## 2023-08-13 MED ORDER — HYDROMORPHONE HCL 1 MG/ML IJ SOLN
1.0000 mg | INTRAMUSCULAR | Status: DC | PRN
  Filled 2023-08-13: qty 1

## 2023-08-13 MED ORDER — OXYCODONE HCL 5 MG PO TABS
10.0000 mg | ORAL_TABLET | ORAL | Status: DC | PRN
  Administered 2023-08-13: 10 mg via ORAL
  Filled 2023-08-13 (×2): qty 2

## 2023-08-13 MED ORDER — CEFAZOLIN SODIUM-DEXTROSE 2-4 GM/100ML-% IV SOLN: 2.0000 g | Freq: Four times a day (QID) | INTRAVENOUS | Status: DC

## 2023-08-13 MED ORDER — ACETAMINOPHEN 10 MG/ML IV SOLN: 1000.0000 mg | Freq: Once | INTRAVENOUS | Status: DC | PRN

## 2023-08-13 MED ORDER — HYDROMORPHONE HCL 1 MG/ML IJ SOLN
INTRAMUSCULAR | Status: AC
  Filled 2023-08-13: qty 0.5

## 2023-08-13 MED ORDER — SODIUM CHLORIDE 0.9% FLUSH: 3.0000 mL | INTRAVENOUS | Status: DC | PRN

## 2023-08-13 MED ORDER — BACITRACIN ZINC 500 UNIT/GM EX OINT
TOPICAL_OINTMENT | CUTANEOUS | Status: AC
  Filled 2023-08-13: qty 28.35

## 2023-08-13 MED ORDER — OXYCODONE HCL 5 MG PO TABS
ORAL_TABLET | ORAL | Status: AC
Start: 2023-08-13 — End: 2023-08-14
  Filled 2023-08-13: qty 1

## 2023-08-13 MED ORDER — CHLORHEXIDINE GLUCONATE CLOTH 2 % EX PADS: 6.0000 | MEDICATED_PAD | Freq: Once | CUTANEOUS | Status: DC

## 2023-08-13 MED ORDER — DAPTOMYCIN-SODIUM CHLORIDE 700-0.9 MG/100ML-% IV SOLN
9.0000 mg/kg | Freq: Every day | INTRAVENOUS | Status: DC
  Administered 2023-08-13 – 2023-08-15 (×3): 700 mg via INTRAVENOUS
  Filled 2023-08-13 (×4): qty 100

## 2023-08-13 MED ORDER — METHOCARBAMOL 1000 MG/10ML IJ SOLN: 500.0000 mg | Freq: Four times a day (QID) | INTRAMUSCULAR | Status: DC | PRN

## 2023-08-13 MED ORDER — 0.9 % SODIUM CHLORIDE (POUR BTL) OPTIME
TOPICAL | Status: DC | PRN
  Administered 2023-08-13: 1000 mL

## 2023-08-13 MED ORDER — FENTANYL CITRATE (PF) 250 MCG/5ML IJ SOLN
INTRAMUSCULAR | Status: DC | PRN
  Administered 2023-08-13: 100 ug via INTRAVENOUS
  Administered 2023-08-13 (×2): 50 ug via INTRAVENOUS

## 2023-08-13 MED ORDER — ONDANSETRON HCL 4 MG/2ML IJ SOLN: 4.0000 mg | Freq: Four times a day (QID) | INTRAMUSCULAR | Status: DC | PRN

## 2023-08-13 MED ORDER — SUGAMMADEX SODIUM 200 MG/2ML IV SOLN
INTRAVENOUS | Status: DC | PRN
  Administered 2023-08-13: 200 mg via INTRAVENOUS

## 2023-08-13 MED ORDER — LIDOCAINE 2% (20 MG/ML) 5 ML SYRINGE
INTRAMUSCULAR | Status: AC
  Filled 2023-08-13: qty 5

## 2023-08-13 MED ORDER — THROMBIN 5000 UNITS EX KIT
PACK | CUTANEOUS | Status: AC
  Filled 2023-08-13: qty 1

## 2023-08-13 MED ORDER — FEBUXOSTAT 40 MG PO TABS
40.0000 mg | ORAL_TABLET | Freq: Every day | ORAL | Status: DC
  Administered 2023-08-13 – 2023-08-18 (×6): 40 mg via ORAL
  Filled 2023-08-13 (×6): qty 1

## 2023-08-13 MED ORDER — MENTHOL 3 MG MT LOZG: 1.0000 | LOZENGE | OROMUCOSAL | Status: DC | PRN

## 2023-08-13 MED ORDER — OXYCODONE HCL 5 MG PO TABS
5.0000 mg | ORAL_TABLET | Freq: Once | ORAL | Status: AC | PRN
  Administered 2023-08-13: 5 mg via ORAL

## 2023-08-13 MED ORDER — DOCUSATE SODIUM 100 MG PO CAPS
100.0000 mg | ORAL_CAPSULE | Freq: Two times a day (BID) | ORAL | Status: DC
  Administered 2023-08-13 – 2023-08-18 (×11): 100 mg via ORAL
  Filled 2023-08-13 (×11): qty 1

## 2023-08-13 MED ORDER — ONDANSETRON HCL 4 MG/2ML IJ SOLN: 4.0000 mg | Freq: Once | INTRAMUSCULAR | Status: DC | PRN

## 2023-08-13 MED ORDER — OXYCODONE HCL 5 MG PO TABS
5.0000 mg | ORAL_TABLET | ORAL | Status: DC | PRN
  Administered 2023-08-14 – 2023-08-16 (×4): 5 mg via ORAL
  Filled 2023-08-13 (×4): qty 1

## 2023-08-13 MED ORDER — PROPOFOL 10 MG/ML IV BOLUS
INTRAVENOUS | Status: AC
  Filled 2023-08-13: qty 20

## 2023-08-13 MED ORDER — BUPIVACAINE HCL (PF) 0.5 % IJ SOLN: INTRAMUSCULAR | Status: DC | PRN

## 2023-08-13 MED ORDER — ROCURONIUM BROMIDE 10 MG/ML (PF) SYRINGE
PREFILLED_SYRINGE | INTRAVENOUS | Status: AC
  Filled 2023-08-13: qty 10

## 2023-08-13 MED ORDER — ACETAMINOPHEN 10 MG/ML IV SOLN
INTRAVENOUS | Status: DC | PRN
  Administered 2023-08-13: 1000 mg via INTRAVENOUS

## 2023-08-13 MED ORDER — THROMBIN 5000 UNITS EX SOLR
OROMUCOSAL | Status: DC | PRN
  Administered 2023-08-13: 5 mL via TOPICAL

## 2023-08-13 MED ORDER — TORSEMIDE 20 MG PO TABS
20.0000 mg | ORAL_TABLET | Freq: Every day | ORAL | Status: DC
  Administered 2023-08-14 – 2023-08-18 (×5): 20 mg via ORAL
  Filled 2023-08-13 (×5): qty 1

## 2023-08-13 MED ORDER — NEBIVOLOL HCL 10 MG PO TABS
10.0000 mg | ORAL_TABLET | Freq: Every day | ORAL | Status: DC
  Administered 2023-08-14 – 2023-08-18 (×5): 10 mg via ORAL
  Filled 2023-08-13 (×5): qty 1

## 2023-08-13 MED ORDER — STERILE WATER FOR IRRIGATION IR SOLN
Status: DC | PRN
  Administered 2023-08-13: 1000 mL

## 2023-08-13 MED ORDER — ENOXAPARIN SODIUM 40 MG/0.4ML IJ SOSY
40.0000 mg | PREFILLED_SYRINGE | INTRAMUSCULAR | Status: DC
Start: 2023-08-14 — End: 2023-08-15
  Administered 2023-08-14 – 2023-08-15 (×2): 40 mg via SUBCUTANEOUS
  Filled 2023-08-13 (×2): qty 0.4

## 2023-08-13 MED ORDER — FERROUS SULFATE 325 (65 FE) MG PO TABS
325.0000 mg | ORAL_TABLET | Freq: Two times a day (BID) | ORAL | Status: DC
  Administered 2023-08-13 – 2023-08-18 (×10): 325 mg via ORAL
  Filled 2023-08-13 (×10): qty 1

## 2023-08-13 MED ORDER — EPHEDRINE SULFATE-NACL 50-0.9 MG/10ML-% IV SOSY
PREFILLED_SYRINGE | INTRAVENOUS | Status: DC | PRN
  Administered 2023-08-13: 10 mg via INTRAVENOUS
  Administered 2023-08-13: 5 mg via INTRAVENOUS

## 2023-08-13 MED ORDER — LIDOCAINE 2% (20 MG/ML) 5 ML SYRINGE
INTRAMUSCULAR | Status: DC | PRN
Start: 2023-08-13 — End: 2023-08-13
  Administered 2023-08-13: 100 mg via INTRAVENOUS

## 2023-08-13 MED ORDER — ACETAMINOPHEN 650 MG RE SUPP: 650.0000 mg | RECTAL | Status: DC | PRN

## 2023-08-13 MED ORDER — VANCOMYCIN HCL 1000 MG IV SOLR
INTRAVENOUS | Status: AC
  Filled 2023-08-13: qty 20

## 2023-08-13 MED ORDER — BUPIVACAINE HCL (PF) 0.5 % IJ SOLN
INTRAMUSCULAR | Status: AC
  Filled 2023-08-13: qty 30

## 2023-08-13 MED ORDER — SODIUM CHLORIDE 0.9 % IV SOLN
2.0000 g | INTRAVENOUS | Status: DC
  Administered 2023-08-13 – 2023-08-15 (×3): 2 g via INTRAVENOUS
  Filled 2023-08-13 (×4): qty 20

## 2023-08-13 MED ORDER — LIDOCAINE-EPINEPHRINE 1 %-1:100000 IJ SOLN
INTRAMUSCULAR | Status: AC
  Filled 2023-08-13: qty 1

## 2023-08-13 MED ORDER — ONDANSETRON HCL 4 MG/2ML IJ SOLN
INTRAMUSCULAR | Status: DC | PRN
  Administered 2023-08-13: 4 mg via INTRAVENOUS

## 2023-08-13 MED ORDER — PHENYLEPHRINE HCL-NACL 20-0.9 MG/250ML-% IV SOLN
INTRAVENOUS | Status: DC | PRN
  Administered 2023-08-13: 20 ug/min via INTRAVENOUS

## 2023-08-13 MED ORDER — FENTANYL CITRATE (PF) 100 MCG/2ML IJ SOLN
INTRAMUSCULAR | Status: AC
  Filled 2023-08-13: qty 2

## 2023-08-13 MED ORDER — POTASSIUM CHLORIDE CRYS ER 10 MEQ PO TBCR
10.0000 meq | EXTENDED_RELEASE_TABLET | Freq: Two times a day (BID) | ORAL | Status: DC
  Administered 2023-08-13 – 2023-08-18 (×11): 10 meq via ORAL
  Filled 2023-08-13 (×11): qty 1

## 2023-08-13 MED ORDER — METHOCARBAMOL 500 MG PO TABS
500.0000 mg | ORAL_TABLET | Freq: Four times a day (QID) | ORAL | Status: DC | PRN
  Administered 2023-08-13 – 2023-08-16 (×3): 500 mg via ORAL
  Filled 2023-08-13 (×4): qty 1

## 2023-08-13 MED ORDER — CEFAZOLIN SODIUM-DEXTROSE 2-4 GM/100ML-% IV SOLN
2.0000 g | INTRAVENOUS | Status: AC
  Administered 2023-08-13: 2 g via INTRAVENOUS
  Filled 2023-08-13: qty 100

## 2023-08-13 MED ORDER — PHENYLEPHRINE 80 MCG/ML (10ML) SYRINGE FOR IV PUSH (FOR BLOOD PRESSURE SUPPORT)
PREFILLED_SYRINGE | INTRAVENOUS | Status: DC | PRN
Start: 2023-08-13 — End: 2023-08-13
  Administered 2023-08-13 (×2): 80 ug via INTRAVENOUS
  Administered 2023-08-13: 160 ug via INTRAVENOUS
  Administered 2023-08-13 (×2): 80 ug via INTRAVENOUS

## 2023-08-13 MED ORDER — ONDANSETRON HCL 4 MG/2ML IJ SOLN
INTRAMUSCULAR | Status: AC
  Filled 2023-08-13: qty 2

## 2023-08-13 MED ORDER — ORAL CARE MOUTH RINSE: 15.0000 mL | Freq: Once | OROMUCOSAL | Status: AC

## 2023-08-13 MED ORDER — HYDROMORPHONE HCL 1 MG/ML IJ SOLN
INTRAMUSCULAR | Status: DC | PRN
  Administered 2023-08-13 (×2): .25 mg via INTRAVENOUS

## 2023-08-13 MED ORDER — VITAMIN B-12 1000 MCG PO TABS
500.0000 ug | ORAL_TABLET | Freq: Every day | ORAL | Status: DC
  Administered 2023-08-14 – 2023-08-18 (×5): 500 ug via ORAL
  Filled 2023-08-13 (×6): qty 1

## 2023-08-13 MED ORDER — POLYETHYLENE GLYCOL 3350 17 G PO PACK: 17.0000 g | PACK | Freq: Every day | ORAL | Status: DC | PRN

## 2023-08-13 MED ORDER — DEXAMETHASONE SODIUM PHOSPHATE 10 MG/ML IJ SOLN
INTRAMUSCULAR | Status: DC | PRN
  Administered 2023-08-13: 5 mg via INTRAVENOUS

## 2023-08-13 MED ORDER — DOCUSATE SODIUM 100 MG PO CAPS: 100.0000 mg | ORAL_CAPSULE | Freq: Two times a day (BID) | ORAL | Status: DC | PRN

## 2023-08-13 MED ORDER — SODIUM CHLORIDE 0.9 % IV SOLN
250.0000 mL | INTRAVENOUS | Status: AC
Start: 2023-08-13 — End: 2023-08-14
  Administered 2023-08-13: 250 mL via INTRAVENOUS

## 2023-08-13 MED ORDER — ONDANSETRON HCL 4 MG PO TABS: 4.0000 mg | ORAL_TABLET | Freq: Four times a day (QID) | ORAL | Status: DC | PRN

## 2023-08-13 MED ORDER — GLYCOPYRROLATE PF 0.2 MG/ML IJ SOSY
PREFILLED_SYRINGE | INTRAMUSCULAR | Status: DC | PRN
  Administered 2023-08-13: .2 mg via INTRAVENOUS

## 2023-08-13 MED ORDER — SERTRALINE HCL 100 MG PO TABS
100.0000 mg | ORAL_TABLET | Freq: Every day | ORAL | Status: DC
  Administered 2023-08-14 – 2023-08-18 (×5): 100 mg via ORAL
  Filled 2023-08-13 (×5): qty 1

## 2023-08-13 MED ORDER — OXYCODONE HCL 5 MG/5ML PO SOLN: 5.0000 mg | Freq: Once | ORAL | Status: AC | PRN

## 2023-08-13 MED ORDER — AMIODARONE HCL 200 MG PO TABS
200.0000 mg | ORAL_TABLET | Freq: Every day | ORAL | Status: DC
  Administered 2023-08-14 – 2023-08-18 (×5): 200 mg via ORAL
  Filled 2023-08-13 (×5): qty 1

## 2023-08-13 MED ORDER — LACTATED RINGERS IV SOLN
INTRAVENOUS | Status: DC | PRN
Start: 2023-08-13 — End: 2023-08-13

## 2023-08-13 MED ORDER — DEXAMETHASONE SODIUM PHOSPHATE 10 MG/ML IJ SOLN
INTRAMUSCULAR | Status: AC
  Filled 2023-08-13: qty 1

## 2023-08-13 MED ORDER — ROCURONIUM BROMIDE 10 MG/ML (PF) SYRINGE
PREFILLED_SYRINGE | INTRAVENOUS | Status: DC | PRN
  Administered 2023-08-13: 70 mg via INTRAVENOUS

## 2023-08-13 MED ORDER — PHENYLEPHRINE 80 MCG/ML (10ML) SYRINGE FOR IV PUSH (FOR BLOOD PRESSURE SUPPORT)
PREFILLED_SYRINGE | INTRAVENOUS | Status: AC
  Filled 2023-08-13: qty 10

## 2023-08-13 MED ORDER — SODIUM CHLORIDE 0.9% FLUSH
3.0000 mL | Freq: Two times a day (BID) | INTRAVENOUS | Status: DC
  Administered 2023-08-13 – 2023-08-18 (×6): 3 mL via INTRAVENOUS

## 2023-08-13 MED ORDER — SODIUM CHLORIDE 0.9 % IV SOLN: INTRAVENOUS | Status: DC | PRN

## 2023-08-13 MED ORDER — CHLORHEXIDINE GLUCONATE 0.12 % MT SOLN
15.0000 mL | Freq: Once | OROMUCOSAL | Status: AC
  Administered 2023-08-13: 15 mL via OROMUCOSAL
  Filled 2023-08-13: qty 15

## 2023-08-13 MED ORDER — PANTOPRAZOLE SODIUM 40 MG PO TBEC
40.0000 mg | DELAYED_RELEASE_TABLET | Freq: Every day | ORAL | Status: DC
  Administered 2023-08-14 – 2023-08-18 (×5): 40 mg via ORAL
  Filled 2023-08-13 (×5): qty 1

## 2023-08-13 SURGICAL SUPPLY — 55 items
BAG COUNTER SPONGE SURGICOUNT (BAG) ×1 IMPLANT
BAND RUBBER #18 3X1/16 STRL (MISCELLANEOUS) ×2 IMPLANT
BIT DRILL NEURO 2X3.1 SFT TUCH (MISCELLANEOUS) ×1 IMPLANT
BIT DRILL WIRE PASS 1.3MM (BIT) IMPLANT
BLADE CLIPPER SURG (BLADE) IMPLANT
BUR MATCHSTICK NEURO 3.0 LAGG (BURR) ×1 IMPLANT
CANISTER SUCT 3000ML PPV (MISCELLANEOUS) ×1 IMPLANT
CNTNR URN SCR LID CUP LEK RST (MISCELLANEOUS) IMPLANT
DERMABOND ADVANCED .7 DNX12 (GAUZE/BANDAGES/DRESSINGS) ×1 IMPLANT
DRAIN JACKSON PRT FLT 7MM (DRAIN) IMPLANT
DRAPE C-ARM 42X72 X-RAY (DRAPES) ×2 IMPLANT
DRAPE LAPAROTOMY 100X72 PEDS (DRAPES) ×1 IMPLANT
DRAPE MICROSCOPE SLANT 54X150 (MISCELLANEOUS) IMPLANT
DRAPE SURG 17X23 STRL (DRAPES) ×1 IMPLANT
DRSG OPSITE POSTOP 4X6 (GAUZE/BANDAGES/DRESSINGS) ×1 IMPLANT
DRSG OPSITE POSTOP 4X8 (GAUZE/BANDAGES/DRESSINGS) IMPLANT
DURAPREP 26ML APPLICATOR (WOUND CARE) ×1 IMPLANT
ELECT COATED BLADE 2.86 ST (ELECTRODE) ×1 IMPLANT
ELECTRODE REM PT RTRN 9FT ADLT (ELECTROSURGICAL) ×1 IMPLANT
EVACUATOR SILICONE 100CC (DRAIN) IMPLANT
GAUZE 4X4 16PLY ~~LOC~~+RFID DBL (SPONGE) IMPLANT
GLOVE BIO SURGEON STRL SZ7 (GLOVE) ×2 IMPLANT
GLOVE BIOGEL PI IND STRL 7.5 (GLOVE) ×3 IMPLANT
GLOVE ECLIPSE 7.5 STRL STRAW (GLOVE) ×1 IMPLANT
GLOVE EXAM NITRILE XL STR (GLOVE) IMPLANT
GOWN STRL REUS W/ TWL LRG LVL3 (GOWN DISPOSABLE) ×2 IMPLANT
GOWN STRL REUS W/ TWL XL LVL3 (GOWN DISPOSABLE) ×2 IMPLANT
GOWN STRL REUS W/TWL 2XL LVL3 (GOWN DISPOSABLE) IMPLANT
HEMOSTAT POWDER KIT SURGIFOAM (HEMOSTASIS) ×1 IMPLANT
KIT BASIN OR (CUSTOM PROCEDURE TRAY) ×1 IMPLANT
KIT TURNOVER KIT B (KITS) ×1 IMPLANT
NDL HYPO 21X1.5 SAFETY (NEEDLE) ×1 IMPLANT
NDL HYPO 22X1.5 SAFETY MO (MISCELLANEOUS) ×1 IMPLANT
NDL SPNL 18GX3.5 QUINCKE PK (NEEDLE) IMPLANT
NEEDLE HYPO 21X1.5 SAFETY (NEEDLE) ×1 IMPLANT
NEEDLE HYPO 22X1.5 SAFETY MO (MISCELLANEOUS) ×1 IMPLANT
NEEDLE SPNL 18GX3.5 QUINCKE PK (NEEDLE) IMPLANT
NS IRRIG 1000ML POUR BTL (IV SOLUTION) ×1 IMPLANT
PACK LAMINECTOMY NEURO (CUSTOM PROCEDURE TRAY) ×1 IMPLANT
PAD ARMBOARD POSITIONER FOAM (MISCELLANEOUS) ×3 IMPLANT
PATTIES SURGICAL .5 X.5 (GAUZE/BANDAGES/DRESSINGS) ×1 IMPLANT
PATTIES SURGICAL 1X1 (DISPOSABLE) ×1 IMPLANT
PIN MAYFIELD SKULL DISP (PIN) ×1 IMPLANT
SPIKE FLUID TRANSFER (MISCELLANEOUS) ×1 IMPLANT
SPONGE SURGIFOAM ABS GEL SZ50 (HEMOSTASIS) IMPLANT
SPONGE T-LAP 4X18 ~~LOC~~+RFID (SPONGE) IMPLANT
SUT MNCRL AB 4-0 PS2 18 (SUTURE) ×1 IMPLANT
SUT VIC AB 0 CT1 18XCR BRD8 (SUTURE) ×1 IMPLANT
SUT VIC AB 2-0 CP2 18 (SUTURE) ×1 IMPLANT
SYR 20ML LL LF (SYRINGE) ×1 IMPLANT
SYR 30ML SLIP (SYRINGE) ×1 IMPLANT
SYR 3ML LL SCALE MARK (SYRINGE) IMPLANT
TOWEL GREEN STERILE (TOWEL DISPOSABLE) ×1 IMPLANT
TOWEL GREEN STERILE FF (TOWEL DISPOSABLE) ×1 IMPLANT
WATER STERILE IRR 1000ML POUR (IV SOLUTION) ×1 IMPLANT

## 2023-08-13 NOTE — Op Note (Signed)
 PREOP DIAGNOSIS: chronic wound dehiscence  POSTOP DIAGNOSIS: chronic wound dehiscence   PROCEDURE: 1. Removal of posterior cervical instrumentation 2. Revision of posterior cervical wound   SURGEON: Dr. Arvilla Birmingham, MD  ASSISTANT: Easter Golden, PA.  Please note, no qualified trainees were available to assist with the procedure.  Assistance was required for retraction of the visceral structures to safely allow for instrumentation.  ANESTHESIA: General Endotracheal  EBL: 75 ml  IMPLANTS: none  SPECIMENS: Draining tract, instrumentation, soft tissue around instrumentation.  DRAINS: None  COMPLICATIONS: None immediate  CONDITION: Hemodynamically stable to PACU  HISTORY: Natasha Lawson is a 83 y.o. y.o. female with a history multiple posterior cervical surgeries with the most recent being PCDF C3-5 in July of 2023 by Dr. Ali Antonio. She developed a dehiscence with chronic drainage at lower aspect of wound early Jan 2025.  No fevers, chills, clear evidence of deep infection on MRI or labs.  However, wound continued to drain despite wound care, course of oral antibiotics.  Due to concern for indolent infection of cervical hardware, which did not appear to have good bone purchase, surgery with instrumentation removal and wound revision was recommended.   Risks, benefits, alternatives, and expected convalescence were discussed with the patient.  Risks discussed included but were not limited to bleeding, pain, infection, pseudoarthrosis, hardware failure, adjacent segment disease, CSF leak, neurologic deficits, weakness, numbness, paralysis, coma, and death. After all questions were answered, informed consent was obtained.  PROCEDURE IN DETAIL: The patient was brought to the operating room and transferred to the operative table. After induction of general anesthesia, patient's head was placed in a May[place] head holder and the patient was positioned prone on the operative table with all  pressure points meticulously padded and secured in neutral position. The skin of the posterior neck was then prepped and draped in the usual sterile fashion.  Skin incision was then made sharply and the draining tract was ellipsed out with the incision and sent as specimen.  The tract did seem to terminate at the fascia and did not clearly communicate with the instrumentation.  Bovie electrocautery was used to dissect the subcutaneous tissue and sharply opened the fascia.  Paraspinous muscles were dissected off from C7 lamina in subperiosteal fashion and C7 lateral mass screws were identified. C3, C4, C5, C6 and C7 lateral mass screws were dissected out sharply.     Self-retaining retractor was used.  Screw caps, rods, and screws were removed. There was some gooey gray material around the screw tracts, unclear if that was just reactive from pseudoarthrosis but this was sent for specimen.  The wound was irrigated thoroughly The muscle layer was closed with 0 Vicryl stitches.  The fascia was closed with 0 Vicryl stitches.  The subcutaneous layer was closed with 0 Vicryl stitches.  The dermal layer was closed with 2-0 Vicryl stitches in buried fashion.  Skin was closed with 4- monocryl in subcuticular manner.  A sterile dressing was then placed patient was then removed from Mayfield head holder and flipped supine and extubated by the anesthesia service.  All counts were correct at the end of surgery.  No complications were noted.

## 2023-08-13 NOTE — Anesthesia Procedure Notes (Signed)
 Procedure Name: Intubation Date/Time: 08/13/2023 8:14 AM  Performed by: Basilio Limb, CRNAPre-anesthesia Checklist: Patient identified, Emergency Drugs available, Suction available and Patient being monitored Patient Re-evaluated:Patient Re-evaluated prior to induction Oxygen Delivery Method: Circle System Utilized Preoxygenation: Pre-oxygenation with 100% oxygen Induction Type: IV induction Ventilation: Mask ventilation without difficulty Laryngoscope Size: Glidescope and 3 Grade View: Grade I Tube type: Oral Tube size: 7.0 mm Number of attempts: 1 Airway Equipment and Method: Stylet and Oral airway Placement Confirmation: ETT inserted through vocal cords under direct vision, positive ETCO2 and breath sounds checked- equal and bilateral Secured at: 21 cm Tube secured with: Tape Dental Injury: Teeth and Oropharynx as per pre-operative assessment

## 2023-08-13 NOTE — Consult Note (Signed)
 [facility]    Date of Admission:  08/13/2023     Total days of antibiotics 0               Reason for Consult: Wound dehiscence   Referring Provider: Dr. Andy Bannister  Primary Care Provider: Darnelle Elders, NP   ASSESSMENT:  Natasha Lawson is an 83 y/o Caucasian female with multiple posterior cervical surgeries with most recent in July 2023 and course complicated by development of wound dehiscence and admitted for surgical intervention with concern for possible infection secondary to treatment failure outpatient.  Removal of posterior cervical instrumentation and revision of wound performed today with cultures pending.  Previous cultures reportedly grew E. Coli 2020 and in her PCP's office.  Per surgical notes hardware does not appear to be involved. Will broaden coverage while awaiting surgical cultures to Daptomycin  and Ceftriaxone . Therapeutic drug monitoring of CK levels while on daptomycin .  Continue postoperative wound care per neurosurgery.  Patient noted history of going into atrial fibrillation at night and may require telemetry.  Dr. Levern Reader left a message for neurosurgery. Standard/universal precautions.  Monitor cultures and narrow antibiotics as able.   PLAN:  Change antibiotics to daptomycin  and ceftriaxone . Postoperative wound care per neurosurgery. Monitor surgical specimens for organisms and narrow antibiotics as appropriate. Optimize nutritional intake for healing. Standard/universal precautions. Remaining medical and supportive care per primary team.   Principal Problem:   Wound dehiscence, surgical    [START ON 08/14/2023] amiodarone   200 mg Oral Daily   [START ON 08/14/2023] cyanocobalamin   500 mcg Oral Daily   docusate sodium   100 mg Oral BID   febuxostat   40 mg Oral Daily   fentaNYL        ferrous sulfate   325 mg Oral BID WC   [START ON 08/14/2023] nebivolol   10 mg Oral Daily   oxyCODONE        [START ON 08/14/2023] pantoprazole   40  mg Oral Daily   potassium chloride   10 mEq Oral BID   [START ON 08/14/2023] sertraline   100 mg Oral Q breakfast   sodium chloride  flush  3 mL Intravenous Q12H   [START ON 08/14/2023] torsemide   20 mg Oral Q breakfast     HPI: Natasha Lawson is a 83 y.o. female with previous medical history of sleep apnea, coronary artery disease, right breast cancer, hypertension, and multiple posterior cervical surgeries with the most recent being PCD F of C3-5 in July 2023 now being admitted with a bump located on the inferior portion of her incision.  Natasha Lawson initially had surgery in December 2020 with course complicated by suspected seroma with culture growing E. Coli and Diptheroids. Treated with 1 week of Cephalexin . Second surgery performed in July 2023 with C3, C4 laminectomies and posterior C3-C5 cervical fusion with extension of prior cervical fusion without complication.   Natasha Lawson developed a dehiscence with chronic drainage at the lower aspect of the wound in early January 2025 without fevers, chills, or evidence of deep infection on MRI or labs.  Wounds continue to drain despite wound care and course of oral antibiotics.  Due to concern for possible infection she was admitted to the hospital for debridement.  Brought to the OR on 5/1 for removal of posterior cervical instrumentation and revision of posterior cervical wound in the setting of chronic wound dehiscence.  Found to have tract that seemed to terminate at the fascia and did not clearly communicate with instrumentation below.  Surgical specimens  were obtained and are pending.  Afebrile on admission.  Started on cefazolin .  ID has been consulted for antibiotic recommendations.   Review of Systems: Review of Systems  Constitutional:  Negative for chills, fever and weight loss.  Respiratory:  Negative for cough, shortness of breath and wheezing.   Cardiovascular:  Negative for chest pain and leg swelling.  Gastrointestinal:  Negative for abdominal  pain, constipation, diarrhea, nausea and vomiting.  Skin:  Negative for rash.     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   History of blood transfusion    Hypertension    Sleep apnea    uses CPAP nightly    Social History   Tobacco Use   Smoking status: Never   Smokeless tobacco: Never  Vaping Use   Vaping status: Never Used  Substance Use Topics   Alcohol use: Never   Drug use: Never    Family History  Problem Relation Age of Onset   Heart disease Mother    Cancer - Lung Father    Cancer Sister     Allergies  Allergen Reactions   Capoten [Captopril] Swelling    Tongue    Iodine Hives   Norvasc [Amlodipine Besylate] Swelling   Shellfish Allergy Hives   Terazosin Hcl Other (See Comments)    Caused a-fib   Xarelto [Rivaroxaban] Other (See Comments)    Severe back pain   Welchol [Colesevelam Hcl] Other (See Comments)    Confusion    Crestor [Rosuvastatin Calcium] Other (See Comments)    Muscle aches   Fenofibrate Other (See Comments)    Muscle aches   Metoprolol Palpitations   Pravachol [Pravastatin Sodium] Other (See Comments)    fatigue   Spironolactone Diarrhea   Statins Other (See Comments)    Joint pain    OBJECTIVE: Blood pressure (!) 118/48, pulse (!) 52, temperature (!) 97.5 F (36.4 C), resp. rate (!) 24, height 5\' 7"  (1.702 m), weight 102.1 kg, SpO2 96%.  Physical Exam Constitutional:      General: She is not in acute distress.    Appearance: She is well-developed.  Cardiovascular:     Rate and Rhythm: Normal rate and regular rhythm.     Heart sounds: Normal heart sounds.  Pulmonary:     Effort: Pulmonary effort is normal.     Breath sounds: Normal breath sounds.  Skin:    General: Skin is warm and dry.  Neurological:      Mental Status: She is alert and oriented to person, place, and time.  Psychiatric:        Behavior: Behavior normal.        Thought Content: Thought content normal.        Judgment: Judgment normal.     Lab Results Lab Results  Component Value Date   WBC 5.2 08/06/2023   HGB 10.6 (L) 08/06/2023   HCT 34.5 (L) 08/06/2023   MCV 103.0 (H) 08/06/2023   PLT 155 08/06/2023    Lab Results  Component Value Date   CREATININE 1.31 (H) 08/06/2023   BUN 25 (H) 08/06/2023   NA 142 08/06/2023   K 4.8 08/06/2023   CL 104 08/06/2023  CO2 29 08/06/2023    Lab Results  Component Value Date   ALT 15 06/25/2021   AST 17 06/25/2021   ALKPHOS 124 06/25/2021   BILITOT 0.6 06/25/2021     Microbiology: Recent Results (from the past 240 hours)  Surgical pcr screen     Status: None   Collection Time: 08/06/23 10:57 AM   Specimen: Nasal Mucosa; Nasal Swab  Result Value Ref Range Status   MRSA, PCR NEGATIVE NEGATIVE Final   Staphylococcus aureus NEGATIVE NEGATIVE Final    Comment: (NOTE) The Xpert SA Assay (FDA approved for NASAL specimens in patients 34 years of age and older), is one component of a comprehensive surveillance program. It is not intended to diagnose infection nor to guide or monitor treatment. Performed at [facility], [address], [place], [place] [address]    I have personally spent 28 minutes involved in face-to-face and non-face-to-face activities for this patient on the day of the visit. Professional time spent includes the following activities: Preparing to see the patient (review of tests), Obtaining and/or reviewing separately obtained history (admission/discharge record), Performing a medically appropriate examination and/or evaluation , Ordering medications/tests/procedures, referring and communicating with other health care professionals, Documenting clinical information in the EMR, Independently interpreting results (not separately reported),  Communicating results to the patient/family/caregiver, Counseling and educating the patient/family/caregiver and Care coordination (not separately reported).    Greg Coby Antrobus, NP [facility]  08/13/2023  1:28 PM

## 2023-08-13 NOTE — Transfer of Care (Signed)
 Immediate Anesthesia Transfer of Care Note  Patient: Natasha Lawson  Procedure(s) Performed: REVISION OF POSTERIOR CERVICAL WOUND WITH REMOVAL OF INSTRUMENTATION  Patient Location: PACU  Anesthesia Type:General  Level of Consciousness: awake, alert , and oriented  Airway & Oxygen Therapy: Patient Spontanous Breathing and Patient connected to face mask oxygen  Post-op Assessment: Report given to RN and Post -op Vital signs reviewed and stable  Post vital signs: Reviewed and stable  Last Vitals:  Vitals Value Taken Time  BP 125/98 08/13/23 1033  Temp 36.4 C 08/13/23 1033  Pulse 59 08/13/23 1040  Resp 15 08/13/23 1040  SpO2 97 % 08/13/23 1040  Vitals shown include unfiled device data.  Last Pain:  Vitals:   08/13/23 1033  TempSrc:   PainSc: 6       Patients Stated Pain Goal: 3 (08/13/23 1033)  Complications: No notable events documented.

## 2023-08-13 NOTE — Progress Notes (Signed)
Patient arrived in the unit

## 2023-08-14 ENCOUNTER — Encounter (HOSPITAL_COMMUNITY): Payer: Self-pay | Admitting: Neurosurgery

## 2023-08-14 ENCOUNTER — Other Ambulatory Visit: Payer: Self-pay

## 2023-08-14 DIAGNOSIS — A4901 Methicillin susceptible Staphylococcus aureus infection, unspecified site: Secondary | ICD-10-CM

## 2023-08-14 DIAGNOSIS — T8131XA Disruption of external operation (surgical) wound, not elsewhere classified, initial encounter: Secondary | ICD-10-CM | POA: Diagnosis not present

## 2023-08-14 DIAGNOSIS — Z981 Arthrodesis status: Secondary | ICD-10-CM | POA: Diagnosis not present

## 2023-08-14 LAB — BASIC METABOLIC PANEL WITH GFR
Anion gap: 13 (ref 5–15)
BUN: 19 mg/dL (ref 8–23)
CO2: 23 mmol/L (ref 22–32)
Calcium: 8.7 mg/dL — ABNORMAL LOW (ref 8.9–10.3)
Chloride: 105 mmol/L (ref 98–111)
Creatinine, Ser: 1.17 mg/dL — ABNORMAL HIGH (ref 0.44–1.00)
GFR, Estimated: 47 mL/min — ABNORMAL LOW (ref >60)
Glucose, Bld: 113 mg/dL — ABNORMAL HIGH (ref 70–99)
Potassium: 4 mmol/L (ref 3.5–5.1)
Sodium: 141 mmol/L (ref 135–145)

## 2023-08-14 LAB — SEDIMENTATION RATE: Sed Rate: 43 mm/h — ABNORMAL HIGH (ref 0–22)

## 2023-08-14 LAB — C-REACTIVE PROTEIN: CRP: 3.8 mg/dL — ABNORMAL HIGH (ref <1.0)

## 2023-08-14 LAB — CK: Total CK: 319 U/L — ABNORMAL HIGH (ref 38–234)

## 2023-08-14 MED ORDER — CHLORHEXIDINE GLUCONATE CLOTH 2 % EX PADS
6.0000 | MEDICATED_PAD | Freq: Every day | CUTANEOUS | Status: DC
  Administered 2023-08-15 – 2023-08-18 (×4): 6 via TOPICAL

## 2023-08-14 MED ORDER — SODIUM CHLORIDE 0.9% FLUSH: 10.0000 mL | INTRAVENOUS | Status: DC | PRN

## 2023-08-14 MED ORDER — SODIUM CHLORIDE 0.9% FLUSH
10.0000 mL | Freq: Two times a day (BID) | INTRAVENOUS | Status: DC
  Administered 2023-08-14 – 2023-08-18 (×8): 10 mL

## 2023-08-14 NOTE — Progress Notes (Addendum)
 [facility]    Date of Admission:  08/13/2023   Total days of antibiotics 2   ID: Natasha Lawson is a 83 y.o. female with  cervical fusion with deep tissue infection Principal Problem:   Wound dehiscence, surgical    Subjective: Afebrile, OR cx showing staph aureus and GNR growing  Medications:   amiodarone   200 mg Oral Daily   cyanocobalamin   500 mcg Oral Daily   docusate sodium   100 mg Oral BID   enoxaparin  (LOVENOX ) injection  40 mg Subcutaneous Q24H   febuxostat   40 mg Oral Daily   ferrous sulfate   325 mg Oral BID WC   nebivolol   10 mg Oral Daily   pantoprazole   40 mg Oral Daily   potassium chloride   10 mEq Oral BID   sertraline   100 mg Oral Q breakfast   sodium chloride  flush  3 mL Intravenous Q12H   torsemide   20 mg Oral Q breakfast    Objective: Vital signs in last 24 hours: Temp:  [97.5 F (36.4 C)-98.9 F (37.2 C)] 98.6 F (37 C) (05/02 1524) Pulse Rate:  [52-66] 59 (05/02 1524) Resp:  [16-20] 20 (05/02 1524) BP: (108-152)/(30-108) 119/56 (05/02 1524) SpO2:  [92 %-99 %] 96 % (05/02 1524)  Physical Exam  Constitutional:  oriented to person, place, and time. appears well-developed and well-nourished. No distress.  HENT: [place]/AT, PERRLA, no scleral icterus Mouth/Throat: Oropharynx is clear and moist. No oropharyngeal exudate.  Cardiovascular: Normal rate, regular rhythm and normal heart sounds. Exam reveals no gallop and no friction rub.  No murmur heard.  Pulmonary/Chest: Effort normal and breath sounds normal. No respiratory distress.  has no wheezes.  Neck = supple, no nuchal rigidity Abdominal: Soft. Bowel sounds are normal.  exhibits no distension. There is no tenderness.  Lymphadenopathy: no cervical adenopathy. No axillary adenopathy Neurological: alert and oriented to person, place, and time.  Skin: Skin is warm and dry. No rash noted. No erythema.  Psychiatric: a normal mood and affect.  behavior is normal.    Lab  Results Recent Labs    08/14/23 0726  NA 141  K 4.0  CL 105  CO2 23  BUN 19  CREATININE 1.17*    Microbiology: OR cx -GNR and staph aureus, sensitivities pending  Studies/Results: US  EKG SITE RITE Result Date: 08/14/2023 If Site Rite image not attached, placement could not be confirmed due to current cardiac rhythm.  IMPRESSION: 1. Previous ACDF C5 through C7 with corpectomy at C6. Solid bridging osteophytes anterior at the C7-T1 level. Apparent solid union from C4 through C7. Lucency around the C3 posterior screws demonstrated by recent CT, suggesting that C3-4 remains a mobile level. 2. 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 No ongoing cord compression. 3. Enhancing tissue posteriorly along the surgical approach, most prominent at the lower margin of the approach at the C7 level. This would be consistent with exuberant or vascularized scar. 4. C3-4: Endplate osteophytes and bulging of the disc. Facet and ligamentous hypertrophy. Narrowing of the canal but no gross compression of the cord. Mild bilateral foraminal stenosis. This level remains a mobile level.  Assessment/Plan: Deep tissue cervical wound in setting of cervical fusion = currently on ceftriaxone  we will add vancomycin  and await to see culture data  It will be interesting to see if GNR is e.coli as she had in the past in 2020. Waiting to see if MSSA vs MRSA. Will check sed rate and crp  Will get  picc line placed today  Currently planning for 4-6 wk of IV therapy and see back in the ID clinic to continue on oral abtx for period of 3 additional months. Abtx course will depend  sensitivity of bacteria.  Anticipate discharge on Monday  I have personally spent 50 minutes involved in face-to-face and non-face-to-face activities for this patient on the day of the visit. Professional time spent includes the following activities: Preparing to see the patient (review of tests), Obtaining and/or reviewing  separately obtained history (admission/discharge record), Performing a medically appropriate examination and/or evaluation , Ordering medications/tests/procedures, referring and communicating with other health care professionals, Documenting clinical information in the EMR, Independently interpreting results (not separately reported), Communicating results to the patient/family/caregiver, Counseling and educating the patient/family/caregiver and Care coordination (not separately reported).     [facility] for Infect[facility] Diseases Pager: [phone]  08/14/2023, 3:35 PM

## 2023-08-14 NOTE — Progress Notes (Signed)
    Providing Compassionate, Quality Care - Together   NEUROSURGERY PROGRESS NOTE     S: No issues overnight.    O: EXAM:  BP (!) 127/58 (BP Location: Left Arm)   Pulse (!) 59   Temp 98.4 F (36.9 C) (Oral)   Resp 20   Ht 5\' 7"  (1.702 m)   Wt 102.1 kg   LMP  (LMP Unknown)   SpO2 92%   BMI 35.24 kg/m     Awake, alert, oriented  Speech fluent, appropriate  BUE/BLE 4+/5 SILTx4 Dressing c/d/i   ASSESSMENT:  83 y.o. s/p posterior cervical wound exploration and hardware removal    PLAN: -Appreciate ID mgmt. Cultures pending -Supportive care -Therapies as tolerated.  -Call w/ questions/concerns.   E[place] Golden, [facility]

## 2023-08-14 NOTE — TOC Initial Note (Signed)
 Transition of Care [facility]) - Initial/Assessment Note    Patient Details  Name: Natasha Lawson MRN: [id number] Date of Birth: [birth year]  [facility] CM/SW Contact:    Jonathan Neighbor, RN Phone Number: 08/14/2023, 12:01 PM  Clinical Narrative:                  Pt is from home with her spouse. They are together all the time.  Pt is active with [facility] RN for dressing changes. If recs are for home with HH IV abx she asked to continue with [facility]. Spouse provides needed transportation. Pt manges her own medications and denies any issues.  Awaiting therapy evals.  Pts spouse will be the teachable caregiver if pt goes home with IV abx. ID feels she is going to need 4 weeks. Awaiting cultures to determine antibiotic. Pam with [facility] is on board an following for potential antibiotics at home. TOC following.  Expected Discharge Plan: Home w Home Health Services Barriers to Discharge: Continued Medical Work up   Patient Goals and CMS Choice   CMS Medicare.gov Compare Post Acute Care list provided to:: Patient Choice offered to / list presented to : Patient      Expected Discharge Plan and Services   Discharge Planning Services: CM Consult Post Acute Care Choice: Home Health Living arrangements for the past 2 months: Single Family Home                           HH Arranged: RN          Prior Living Arrangements/Services Living arrangements for the past 2 months: Single Family Home Lives with:: Spouse Patient language and need for interpreter reviewed:: Yes Do you feel safe going back to the place where you live?: Yes        Care giver support system in place?: Yes (comment) Current home services: DME (walker/ shower seat/ BSC/ rollator/ wheelchair/ CPAP/ upright rollator) Criminal Activity/Legal Involvement Pertinent to Current Situation/Hospitalization: No - Comment as needed  Activities of Daily Living   ADL Screening (condition at time of  admission) Independently performs ADLs?: No Is the patient deaf or have difficulty hearing?: Yes Does the patient have difficulty seeing, even when wearing glasses/contacts?: No Does the patient have difficulty concentrating, remembering, or making decisions?: No  Permission Sought/Granted                  Emotional Assessment Appearance:: Appears stated age Attitude/Demeanor/Rapport: Engaged Affect (typically observed): Accepting Orientation: : Oriented to Self, Oriented to Place, Oriented to  Time, Oriented to Situation   Psych Involvement: No (comment)  Admission diagnosis:  Dehiscence of operative wound, subsequent encounter [T81.31XD] Wound dehiscence, surgical [T81.31XA] Patient Active Problem List   Diagnosis Date Noted   Wound dehiscence, surgical 08/13/2023   Thrombocytopenia (HCC)    Iron  deficiency anemia    Steroid-induced hyperglycemia    HTN (hypertension) 04/05/2019   Quadriplegia (HCC) 04/05/2019   Atrial fibrillation (HCC) 04/05/2019   Anticoagulated on Coumadin  04/05/2019   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   Cervical myelopathy (HCC) 03/29/2019   PCP:  Darnelle Elders, NP Pharmacy:   [facility] [address] [place] - [address] [place] [address] Phone: [phone] Fax: [phone]  [facility] - [place], [place] - [address] [place] [address] Phone: [phone] Fax: [phone]  Social Drivers of Health (SDOH) Social History: SDOH Screenings   Depression (PHQ2-9): Low Risk  (05/02/2019)  Tobacco Use: Low Risk  (08/06/2023)   SDOH Interventions:     Readmission Risk Interventions     No data to display

## 2023-08-14 NOTE — Evaluation (Signed)
 Occupational Therapy Evaluation Patient Details Name: Natasha Lawson MRN: [id number] DOB: 1940/12/21 Today's Date: 08/14/2023   History of Present Illness   83 y.o. y.o. female with a history multiple posterior cervical surgeries with the most recent being PCDF C3-5 in July of 2023 by Dr. Ali Antonio. She developed a dehiscence with chronic drainage at lower aspect of wound early Jan 2025.  She is s/p removal of posterior cervical instrumentation and revision of cervical wound. PMH includes: anemia, anxiety, R breast CA, CAD, gout, HTN, prior cervical surgery.     Clinical Impressions PTA pt reports having assist with LB bathing and dressing from her Spouse, ambulating with RW and notes 1 fall in the previous 6 months. Pt currently ambulating hall with CGA + RW support, and completing ADLs with Mod A to setup A. Re-educated pt on C-spine precautions, handout provided to promote carryover. OT to continue following pt to promote strengthening, activity tolerance, and reinforce precautions during ADLs. Pt has no desire for any post acute rehab services, with help of spouse pt likely will not need post acute OT.      If plan is discharge home, recommend the following:   Other (comment);A lot of help with bathing/dressing/bathroom;A little help with walking and/or transfers (supervision for safety with mobility)     Functional Status Assessment   Patient has had a recent decline in their functional status and/or demonstrates limited ability to make significant improvements in function in a reasonable and predictable amount of time     Equipment Recommendations   None recommended by OT (pt has rec DME)     Recommendations for Other Services         Precautions/Restrictions   Precautions Precautions: Fall;Cervical Precaution Booklet Issued: Yes (comment) Precaution/Restrictions Comments: reviewed C spine precautions. Required Braces or Orthoses: Other Brace Other Brace: no brace  needed Restrictions Weight Bearing Restrictions Per Provider Order: No     Mobility Bed Mobility Overal bed mobility: Needs Assistance Bed Mobility: Sit to Supine, Supine to Sit     Supine to sit: Min assist, Used rails, HOB elevated Sit to supine: Min assist, HOB elevated, Used rails   General bed mobility comments: min A to assist BLEs with return to Supine and to raise pt's trunk    Transfers Overall transfer level: Needs assistance Equipment used: 2 person hand held assist Transfers: Sit to/from Stand Sit to Stand: Contact guard assist, +2 safety/equipment                  Balance Overall balance assessment: Needs assistance Sitting-balance support: Feet supported, No upper extremity supported Sitting balance-Leahy Scale: Good     Standing balance support: Reliant on assistive device for balance, Bilateral upper extremity supported, During functional activity Standing balance-Leahy Scale: Poor                             ADL either performed or assessed with clinical judgement   ADL Overall ADL's : Needs assistance/impaired Eating/Feeding: Sitting;Independent   Grooming: Sitting;Set up   Upper Body Bathing: Sitting;Set up   Lower Body Bathing: Sitting/lateral leans;Moderate assistance Lower Body Bathing Details (indicate cue type and reason): has assist at baseline Upper Body Dressing : Sitting;Set up   Lower Body Dressing: Sit to/from stand;Moderate assistance Lower Body Dressing Details (indicate cue type and reason): has assist at baseline from spouse Toilet Transfer: Contact guard assist;Rolling walker (2 wheels);Ambulation   Toileting- Clothing Manipulation and Hygiene: Contact guard  assist;Sit to/from stand       Functional mobility during ADLs: Contact guard assist;Rolling walker (2 wheels) General ADL Comments: Educated pt on cspine precautions     Vision         Perception         Praxis         Pertinent  Vitals/Pain Pain Assessment Pain Assessment: Faces Faces Pain Scale: Hurts little more Pain Location: neck, incision site Pain Descriptors / Indicators: Aching, Sore Pain Intervention(s): Monitored during session, Limited activity within patient's tolerance, Ice applied     Extremity/Trunk Assessment Upper Extremity Assessment Upper Extremity Assessment: Generalized weakness (BUE shoulder AROM limited to ~100 deg bilaterally. Pt denies any numbness or tingling.)   Lower Extremity Assessment Lower Extremity Assessment: Defer to PT evaluation   Cervical / Trunk Assessment Cervical / Trunk Assessment: Neck Surgery   Communication Communication Communication: No apparent difficulties   Cognition Arousal: Alert Behavior During Therapy: WFL for tasks assessed/performed Cognition: No apparent impairments                               Following commands: Intact       Cueing  General Comments   Cueing Techniques: Verbal cues  Pt spouse present during session.   Exercises     Shoulder Instructions      Home Living Family/patient expects to be discharged to:: Private residence Living Arrangements: Spouse/significant other Available Help at Discharge: Family;Available 24 hours/day Type of Home: House Home Access: Stairs to enter [employer] of Steps: 1 Entrance Stairs-Rails: Can reach both Home Layout: One level     Bathroom Shower/Tub: Producer, television/film/video: Standard Bathroom Accessibility: Yes   Home Equipment: Agricultural consultant (2 wheels);Rollator (4 wheels);BSC/3in1;Shower seat;Grab bars - tub/shower;Toilet riser;Wheelchair - manual;Lift chair;Other (comment);Transport chair   Additional Comments: lives with spouse who assisted with ADL"s and IADL's      Prior Functioning/Environment Prior Level of Function : Needs assist;History of Falls (last six months)       Physical Assist : Mobility (physical);ADLs (physical) Mobility  (physical): Bed mobility;Transfers;Gait ADLs (physical): Bathing;Dressing;Toileting Mobility Comments: Pt uses rollator around the house but RW in the bathroom. Uses transport chair out in the community. Pt reports 1 fall in the past 6 months. ADLs Comments: spouse helps get in the shower and helps wash lower body. Pt is able to wash her upper body. Pt manages her own medication    OT Problem List: Impaired balance (sitting and/or standing);Decreased knowledge of precautions;Pain;Obesity;Decreased strength   OT Treatment/Interventions: Self-care/ADL training;Patient/family education;Therapeutic exercise;Balance training;Therapeutic activities;DME and/or AE instruction      OT Goals(Current goals can be found in the care plan section)   Acute Rehab OT Goals Patient Stated Goal: to go home OT Goal Formulation: With patient Time For Goal Achievement: 08/28/23 Potential to Achieve Goals: Good ADL Goals Pt Will Perform Grooming: with supervision;standing Pt Will Perform Lower Body Dressing: with modified independence;with adaptive equipment;sitting/lateral leans;sit to/from stand Pt Will Transfer to Toilet: with supervision;ambulating Pt Will Perform Toileting - Clothing Manipulation and hygiene: with set-up;sitting/lateral leans Additional ADL Goal #3: Pt will verbalize understanding of C spine precautions.   OT Frequency:  Min 2X/week    Co-evaluation PT/OT/SLP Co-Evaluation/Treatment: Yes Reason for Co-Treatment: To address functional/ADL transfers (hx of falls) PT goals addressed during session: Mobility/safety with mobility;Balance;Proper use of DME OT goals addressed during session: ADL's and self-care;Strengthening/ROM      AM-PAC  OT "6 Clicks" Daily Activity     Outcome Measure Help from another person eating meals?: None Help from another person taking care of personal grooming?: A Little Help from another person toileting, which includes using toliet, bedpan, or urinal?:  A Little Help from another person bathing (including washing, rinsing, drying)?: A Lot Help from another person to put on and taking off regular upper body clothing?: A Little Help from another person to put on and taking off regular lower body clothing?: A Lot 6 Click Score: 17   End of Session Equipment Utilized During Treatment: Gait belt;Rolling walker (2 wheels) Nurse Communication: Mobility status  Activity Tolerance: Patient tolerated treatment well Patient left: in bed;with call bell/phone within reach;with bed alarm set;with nursing/sitter in room;with family/visitor present  OT Visit Diagnosis: History of falling (Z91.81);Muscle weakness (generalized) (M62.81);Pain Pain - part of body:  (neck)                Time: 7829-5621 OT Time Calculation (min): 25 min Charges:  OT General Charges $OT Visit: 1 Visit OT Evaluation $OT Eval Low Complexity: 1 Low  08/14/2023  AB, OTR/L  Acute Rehabilitation Services  Office: [phone]   Jorene New 08/14/2023, 1:51 PM

## 2023-08-14 NOTE — Progress Notes (Signed)
 Peripherally Inserted Central Catheter Placement  The IV Nurse has discussed with the patient and/or persons authorized to consent for the patient, the purpose of this procedure and the potential benefits and risks involved with this procedure.  The benefits include less needle sticks, lab draws from the catheter, and the patient may be discharged home with the catheter. Risks include, but not limited to, infection, bleeding, blood clot (thrombus formation), and puncture of an artery; nerve damage and irregular heartbeat and possibility to perform a PICC exchange if needed/ordered by physician.  Alternatives to this procedure were also discussed.  Bard Power PICC patient education guide, fact sheet on infection prevention and patient information card has been provided to patient /or left at bedside.    PICC Placement Documentation  PICC Single Lumen 08/14/23 Left Brachial 40 cm 0 cm (Active)  Indication for Insertion or Continuance of Line Home intravenous therapies (PICC only) 08/14/23 2145  Exposed Catheter (cm) 0 cm 08/14/23 2145  Site Assessment Clean, Dry, Intact 08/14/23 2145  Line Status Blood return noted;Flushed;Saline locked 08/14/23 2145  Dressing Type Transparent;Securing device;Other (Comment) 08/14/23 2145  Dressing Status Antimicrobial disc/dressing in place 08/14/23 2145  Line Care Connections checked and tightened 08/14/23 2145  Line Adjustment (NICU/IV Team Only) No 08/14/23 2145  Dressing Intervention Adhesive placed at insertion site (IV team only);Other (Comment) 08/14/23 2145  Dressing Change Due 08/21/23 08/14/23 2145       Benay Bradford 08/14/2023, 10:03 PM

## 2023-08-14 NOTE — Anesthesia Postprocedure Evaluation (Signed)
 Anesthesia Post Note  Patient: Natasha Lawson  Procedure(s) Performed: REVISION OF POSTERIOR CERVICAL WOUND WITH REMOVAL OF INSTRUMENTATION     Anesthesia Type: General Anesthetic complications: no   No notable events documented.  Last Vitals:  Vitals:   08/13/23 2348 08/14/23 0404  BP: (!) 108/30 (!) 152/108  Pulse: 63 66  Resp: 16 18  Temp: 37.2 C 36.8 C  SpO2: 94% 93%    Last Pain:  Vitals:   08/14/23 0420  TempSrc:   PainSc: 2                  Leslye Rast

## 2023-08-14 NOTE — Evaluation (Signed)
 Physical Therapy Evaluation Patient Details Name: Natasha Lawson MRN: [id number] DOB: Mar 12, 1941 Today's Date: 08/14/2023  History of Present Illness  83 y.o. y.o. female with a history multiple posterior cervical surgeries with the most recent being PCDF C3-5 in July of 2023 by Dr. Ali Antonio. She developed a dehiscence with chronic drainage at lower aspect of wound early Jan 2025.  She is s/p removal of posterior cervical instrumentation and revision of cervical wound. PMH includes: anemia, anxiety, R breast CA, CAD, gout, HTN, prior cervical surgery.  Clinical Impression  Pt is presenting close to baseline level of functioning. Currently pt is Min A for bed mobility, CGA for sit to stand and gait. Pt has assistance from spouse at home. Currently pt is presenting at baseline level of functioning and no skilled physical therapy services recommended. Pt will be discharged from skilled physical therapy services at this time; please re-consult if further needs arise.            If plan is discharge home, recommend the following: A little help with walking and/or transfers;Assistance with cooking/housework;Assist for transportation;Help with stairs or ramp for entrance     Equipment Recommendations None recommended by PT     Functional Status Assessment Patient has had a recent decline in their functional status and/or demonstrates limited ability to make significant improvements in function in a reasonable and predictable amount of time     Precautions / Restrictions Precautions Precautions: Fall;Cervical Precaution Booklet Issued: Yes (comment) Precaution/Restrictions Comments: reviewed C spine precautions. Required Braces or Orthoses: Other Brace Other Brace: no brace needed Restrictions Weight Bearing Restrictions Per Provider Order: No      Mobility  Bed Mobility Overal bed mobility: Needs Assistance Bed Mobility: Sit to Supine, Supine to Sit     Supine to sit: Min assist, Used rails,  HOB elevated Sit to supine: Min assist, HOB elevated, Used rails   General bed mobility comments: min A to assist BLEs with return to Supine and to raise pt's trunk    Transfers Overall transfer level: Needs assistance Equipment used: 2 person hand held assist Transfers: Sit to/from Stand Sit to Stand: Contact guard assist, +2 safety/equipment      Ambulation/Gait Ambulation/Gait assistance: Contact guard assist Gait Distance (Feet): 80 Feet Assistive device: Rolling walker (2 wheels) Gait Pattern/deviations: Step-through pattern, Decreased stride length Gait velocity: Decreased Gait velocity interpretation: <1.8 ft/sec, indicate of risk for recurrent falls   General Gait Details: decreased step length and low foot clearance, light UE support on RW     Balance Overall balance assessment: Needs assistance Sitting-balance support: Feet supported, No upper extremity supported Sitting balance-Leahy Scale: Good     Standing balance support: Reliant on assistive device for balance, Bilateral upper extremity supported, During functional activity Standing balance-Leahy Scale: Poor         Pertinent Vitals/Pain Pain Assessment Pain Assessment: Faces Faces Pain Scale: Hurts little more Pain Location: neck, incision site Pain Descriptors / Indicators: Aching, Sore Pain Intervention(s): Monitored during session, Limited activity within patient's tolerance    Home Living Family/patient expects to be discharged to:: Private residence Living Arrangements: Spouse/significant other Available Help at Discharge: Family;Available 24 hours/day Type of Home: House Home Access: Stairs to enter Entrance Stairs-Rails: Can reach both Entrance Stairs-Number of Steps: 1   Home Layout: One level Home Equipment: Agricultural consultant (2 wheels);Rollator (4 wheels);BSC/3in1;Shower seat;Grab bars - tub/shower;Toilet riser;Wheelchair - manual;Lift chair;Other (comment);Transport chair Additional  Comments: lives with spouse who assisted with ADL"s and IADL's  Prior Function Prior Level of Function : Needs assist;History of Falls (last six months)       Physical Assist : Mobility (physical);ADLs (physical) Mobility (physical): Bed mobility;Transfers;Gait ADLs (physical): Bathing;Dressing;Toileting Mobility Comments: Pt uses rollator around the house but RW in the bathroom. Uses transport chair out in the community. Pt reports 1 fall in the past 6 months. ADLs Comments: spouse helps get in the shower and helps wash lower body. Pt is able to wash her upper body. Pt manages her own medication     Extremity/Trunk Assessment   Upper Extremity Assessment Upper Extremity Assessment: Defer to OT evaluation    Lower Extremity Assessment Lower Extremity Assessment: Generalized weakness    Cervical / Trunk Assessment Cervical / Trunk Assessment: Neck Surgery  Communication   Communication Communication: No apparent difficulties    Cognition Arousal: Alert Behavior During Therapy: WFL for tasks assessed/performed       Following commands: Intact       Cueing Cueing Techniques: Verbal cues     General Comments General comments (skin integrity, edema, etc.): Spouse present during session        Assessment/Plan    PT Assessment Patient needs continued PT services  PT Problem List Decreased strength;Decreased mobility;Decreased balance;Decreased activity tolerance;Decreased safety awareness       PT Treatment Interventions DME instruction;Therapeutic exercise;Gait training;Balance training;Stair training;Functional mobility training;Therapeutic activities;Patient/family education    PT Goals (Current goals can be found in the Care Plan section)  Acute Rehab PT Goals Patient Stated Goal: to return home PT Goal Formulation: With patient Time For Goal Achievement: 08/28/23 Potential to Achieve Goals: Fair    Frequency Min 2X/week     Co-evaluation PT/OT/SLP  Co-Evaluation/Treatment: Yes Reason for Co-Treatment: To address functional/ADL transfers PT goals addressed during session: Mobility/safety with mobility;Balance;Proper use of DME OT goals addressed during session: ADL's and self-care;Strengthening/ROM       AM-PAC PT "6 Clicks" Mobility  Outcome Measure Help needed turning from your back to your side while in a flat bed without using bedrails?: A Little Help needed moving from lying on your back to sitting on the side of a flat bed without using bedrails?: A Little Help needed moving to and from a bed to a chair (including a wheelchair)?: A Little Help needed standing up from a chair using your arms (e.g., wheelchair or bedside chair)?: A Little Help needed to walk in hospital room?: A Little Help needed climbing 3-5 steps with a railing? : A Lot 6 Click Score: 17    End of Session Equipment Utilized During Treatment: Gait belt Activity Tolerance: Patient tolerated treatment well Patient left: in bed;with bed alarm set;with call bell/phone within reach;with family/visitor present Nurse Communication: Mobility status PT Visit Diagnosis: Other abnormalities of gait and mobility (R26.89);Muscle weakness (generalized) (M62.81);Unsteadiness on feet (R26.81)    Time: 1610-9604 PT Time Calculation (min) (ACUTE ONLY): 25 min   Charges:   PT Evaluation $PT Eval Low Complexity: 1 Low   PT General Charges $$ ACUTE PT VISIT: 1 Visit        Sloan Duncans, DPT, CLT  Acute Rehabilitation Services Office: [phone] (Secure chat preferred)   Jenice Mitts 08/14/2023, 3:28 PM

## 2023-08-15 LAB — PROTIME-INR
INR: 1.2 (ref 0.8–1.2)
Prothrombin Time: 15.2 s (ref 11.4–15.2)

## 2023-08-15 MED ORDER — WARFARIN - PHARMACIST DOSING INPATIENT: Freq: Every day | Status: DC

## 2023-08-15 MED ORDER — WARFARIN SODIUM 2.5 MG PO TABS
2.5000 mg | ORAL_TABLET | Freq: Once | ORAL | Status: AC
  Administered 2023-08-15: 2.5 mg via ORAL
  Filled 2023-08-15: qty 1

## 2023-08-15 MED ORDER — ALTEPLASE 2 MG IJ SOLR
2.0000 mg | Freq: Once | INTRAMUSCULAR | Status: AC
  Administered 2023-08-15: 2 mg
  Filled 2023-08-15: qty 2

## 2023-08-15 NOTE — Progress Notes (Signed)
 PHARMACY - ANTICOAGULATION CONSULT NOTE  Pharmacy Consult for warfarin Indication: atrial fibrillation  Allergies  Allergen Reactions   Capoten [Captopril] Swelling    Tongue    Iodine Hives   Norvasc [Amlodipine Besylate] Swelling   Shellfish Allergy Hives   Terazosin Hcl Other (See Comments)    Caused a-fib   Xarelto [Rivaroxaban] Other (See Comments)    Severe back pain   Welchol [Colesevelam Hcl] Other (See Comments)    Confusion    Crestor [Rosuvastatin Calcium] Other (See Comments)    Muscle aches   Fenofibrate Other (See Comments)    Muscle aches   Metoprolol Palpitations   Pravachol [Pravastatin Sodium] Other (See Comments)    fatigue   Spironolactone Diarrhea   Statins Other (See Comments)    Joint pain    Patient Measurements: Height: 5\' 7"  (170.2 cm) Weight: 102.1 kg (225 lb) IBW/kg (Calculated) : 61.6 HEPARIN  DW (KG): 84.5  Vital Signs: Temp: 98.2 F (36.8 C) (05/03 1200) Temp Source: Oral (05/03 1200) BP: 127/59 (05/03 1044) Pulse Rate: 86 (05/03 1044)  Labs: Recent Labs    08/13/23 0603 08/14/23 0726  APTT 29  --   LABPROT 15.8*  --   INR 1.2  --   CREATININE  --  1.17*  CKTOTAL  --  319*    Estimated Creatinine Clearance: 47.1 mL/min (A) (by C-G formula based on SCr of 1.17 mg/dL (H)).   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   History of blood transfusion    Hypertension    Sleep apnea    uses CPAP nightly    Medications:  Scheduled:   alteplase  2 mg Intracatheter Once   amiodarone   200 mg Oral Daily   Chlorhexidine  Gluconate Cloth  6 each Topical Daily   cyanocobalamin   500 mcg Oral Daily   docusate sodium   100 mg Oral BID   febuxostat   40 mg Oral Daily   ferrous sulfate   325 mg Oral BID  WC   nebivolol   10 mg Oral Daily   pantoprazole   40 mg Oral Daily   potassium chloride   10 mEq Oral BID   sertraline   100 mg Oral Q breakfast   sodium chloride  flush  10-40 mL Intracatheter Q12H   sodium chloride  flush  3 mL Intravenous Q12H   torsemide   20 mg Oral Q breakfast   Warfarin - Pharmacist Dosing Inpatient   Does not apply q1600   Infusions:   cefTRIAXone  (ROCEPHIN )  IV 2 g (08/14/23 1544)   DAPTOmycin  700 mg (08/14/23 1336)    Assessment: 83 yo F presenting for wound debridement and hardware removal. PMH of atrial fibrillation on warfarin. Stopped warfarin preop. Now okay to restart per Neurosurgery. Pharmacy consulted for warfarin management.  Home regimen: warfarin 2mg  on MWF, 1mg  on all other days  INR 1.2 today is subtherapeutic. Surgical wound noted to be clean and dry on exam per Neuro. Patient is on home amiodarone  and torsemide . Charted eating 75-100% of meals.   In previous notes, patient is noted to be sensitive to warfarin and is recently postop, so will dose conservatively.  Goal of Therapy:  INR 2-3 Monitor platelets  by anticoagulation protocol: Yes   Plan:  Stop enoxaparin  vte ppx Warfarin 2.5 mg x1 today Monitor daily INR, CBC, signs/symptoms of bleeding   Valarie Garner, PharmD PGY1 Pharmacy Resident  Please check AMION for all [facility] Pharmacy phone numbers After 10:00 PM, call Main Pharmacy [phone] 08/15/2023,12:51 PM

## 2023-08-15 NOTE — Progress Notes (Signed)
 Overall looks good.  Neck pain well-controlled.  No new radicular pain numbness or weakness.  Wound clean and dry.  Patient had PICC line placed.  Wound cultures with some mild growth of gram-negative rods ID pending.  Continue antibiotic treatment as we await organism identification and sensitivities.

## 2023-08-16 ENCOUNTER — Other Ambulatory Visit: Payer: Self-pay

## 2023-08-16 ENCOUNTER — Inpatient Hospital Stay (HOSPITAL_COMMUNITY)

## 2023-08-16 LAB — CBC
HCT: 29.3 % — ABNORMAL LOW (ref 36.0–46.0)
Hemoglobin: 9.4 g/dL — ABNORMAL LOW (ref 12.0–15.0)
MCH: 32.9 pg (ref 26.0–34.0)
MCHC: 32.1 g/dL (ref 30.0–36.0)
MCV: 102.4 fL — ABNORMAL HIGH (ref 80.0–100.0)
Platelets: 124 10*3/uL — ABNORMAL LOW (ref 150–400)
RBC: 2.86 MIL/uL — ABNORMAL LOW (ref 3.87–5.11)
RDW: 13.7 % (ref 11.5–15.5)
WBC: 6 10*3/uL (ref 4.0–10.5)
nRBC: 0 % (ref 0.0–0.2)

## 2023-08-16 LAB — PROTIME-INR
INR: 1.3 — ABNORMAL HIGH (ref 0.8–1.2)
Prothrombin Time: 16.3 s — ABNORMAL HIGH (ref 11.4–15.2)

## 2023-08-16 MED ORDER — SODIUM CHLORIDE 0.9 % IV SOLN
3.0000 g | Freq: Four times a day (QID) | INTRAVENOUS | Status: DC
  Administered 2023-08-16 – 2023-08-18 (×7): 3 g via INTRAVENOUS
  Filled 2023-08-16 (×8): qty 8

## 2023-08-16 MED ORDER — WARFARIN SODIUM 2.5 MG PO TABS
2.5000 mg | ORAL_TABLET | Freq: Once | ORAL | Status: AC
  Administered 2023-08-16: 2.5 mg via ORAL
  Filled 2023-08-16: qty 1

## 2023-08-16 NOTE — Progress Notes (Addendum)
 Attempted to place PICC in cephalic vein with 17% occupancy. Cephalic site attempted to aid in prevention of the PICC kinking in the arm again.Pt and husband agreeable to attempt. Unable to thread guidewire fully.  Exchanged PICC at same previous site without difficulty.  RUE restricted due to hx mastectomy.  If this PICC develops same issue, recommendations to place PICC centrally by IR.

## 2023-08-16 NOTE — Progress Notes (Signed)
 Pharmacy Antibiotic Note  Natasha Lawson is a 83 y.o. 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  Pharmacy has been consulted for Unasyn dosing.  Wound cultures growing MSSA and E. Coli. WBC 6.0, afeb. ID team called lab to set up cefazolin  sensitivities for E. Coli, may be able to further narrow once that returns.  Plan: Start Unasyn 3g IV q6h Monitor clinical status, culture data, and kidney function  Height: 5\' 7"  (170.2 cm) Weight: 102.1 kg (225 lb) IBW/kg (Calculated) : 61.6  Temp (24hrs), Avg:97.9 F (36.6 C), Min:97.6 F (36.4 C), Max:98.2 F (36.8 C)  Recent Labs  Lab 08/14/23 0726 08/16/23 0651  WBC  --  6.0  CREATININE 1.17*  --     Estimated Creatinine Clearance: 47.1 mL/min (A) (by C-G formula based on SCr of 1.17 mg/dL (H)).    Allergies  Allergen Reactions   Capoten [Captopril] Swelling    Tongue    Iodine Hives   Norvasc [Amlodipine Besylate] Swelling   Shellfish Allergy Hives   Terazosin Hcl Other (See Comments)    Caused a-fib   Xarelto [Rivaroxaban] Other (See Comments)    Severe back pain   Welchol [Colesevelam Hcl] Other (See Comments)    Confusion    Crestor [Rosuvastatin Calcium] Other (See Comments)    Muscle aches   Fenofibrate Other (See Comments)    Muscle aches   Metoprolol Palpitations   Pravachol [Pravastatin Sodium] Other (See Comments)    fatigue   Spironolactone Diarrhea   Statins Other (See Comments)    Joint pain    Antimicrobials this admission: ceftriaxone  5/1 >>  daptomycin  5/1 >>   Microbiology results: 5/1 tissue cx: rare MSSA 5/1 tissue cx: rare E.coli R-amp   Thank you for allowing pharmacy to be a part of this patient's care.  Valarie Garner, PharmD PGY1 Pharmacy Resident  Please check AMION for all [facility] phone numbers After 10:00 PM, call Main Pharmacy [phone] 08/16/2023 2:05 PM

## 2023-08-16 NOTE — Progress Notes (Signed)
 Overall stable.  Neck pain well-controlled.  No new radicular pain numbness or weakness.  Patient slowly mobilizing.  Afebrile.  Vital signs are stable.  Awake and alert.  Oriented and appropriate.  Motor and sensory function stable.  Wound clean and dry.  Cultures growing rare E. coli.  Patient is status post chronic indolent posterior cervical wound infection status post hardware removal and I&D.  Overall doing well.  Will defer final antibiotic recommendations to infectious disease and work towards discharge hopefully tomorrow on IV antibiotics.

## 2023-08-16 NOTE — Progress Notes (Signed)
 PHARMACY - ANTICOAGULATION CONSULT NOTE  Pharmacy Consult for warfarin Indication: atrial fibrillation  Allergies  Allergen Reactions   Capoten [Captopril] Swelling    Tongue    Iodine Hives   Norvasc [Amlodipine Besylate] Swelling   Shellfish Allergy Hives   Terazosin Hcl Other (See Comments)    Caused a-fib   Xarelto [Rivaroxaban] Other (See Comments)    Severe back pain   Welchol [Colesevelam Hcl] Other (See Comments)    Confusion    Crestor [Rosuvastatin Calcium] Other (See Comments)    Muscle aches   Fenofibrate Other (See Comments)    Muscle aches   Metoprolol Palpitations   Pravachol [Pravastatin Sodium] Other (See Comments)    fatigue   Spironolactone Diarrhea   Statins Other (See Comments)    Joint pain    Patient Measurements: Height: 5\' 7"  (170.2 cm) Weight: 102.1 kg (225 lb) IBW/kg (Calculated) : 61.6 HEPARIN  DW (KG): 84.5  Vital Signs: Temp: 98.2 F (36.8 C) (05/04 0810) Temp Source: Oral (05/04 0810) BP: 135/69 (05/04 0810) Pulse Rate: 76 (05/04 0810)  Labs: Recent Labs    08/14/23 0726 08/15/23 1327 08/16/23 0651  HGB  --   --  9.4*  HCT  --   --  29.3*  PLT  --   --  124*  LABPROT  --  15.2 16.3*  INR  --  1.2 1.3*  CREATININE 1.17*  --   --   CKTOTAL 319*  --   --     Estimated Creatinine Clearance: 47.1 mL/min (A) (by C-G formula based on SCr of 1.17 mg/dL (H)).   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   History of blood transfusion    Hypertension    Sleep apnea    uses CPAP nightly    Medications:  Scheduled:   amiodarone   200 mg Oral Daily   Chlorhexidine  Gluconate Cloth  6 each Topical Daily   cyanocobalamin   500 mcg Oral Daily   docusate sodium   100 mg Oral BID   febuxostat   40 mg  Oral Daily   ferrous sulfate   325 mg Oral BID WC   nebivolol   10 mg Oral Daily   pantoprazole   40 mg Oral Daily   potassium chloride   10 mEq Oral BID   sertraline   100 mg Oral Q breakfast   sodium chloride  flush  10-40 mL Intracatheter Q12H   sodium chloride  flush  3 mL Intravenous Q12H   torsemide   20 mg Oral Q breakfast   Warfarin - Pharmacist Dosing Inpatient   Does not apply q1600   Infusions:   cefTRIAXone  (ROCEPHIN )  IV 2 g (08/15/23 1601)   DAPTOmycin  700 mg (08/15/23 1658)    Assessment: 83 yo F presenting for wound debridement and hardware removal. PMH of atrial fibrillation on warfarin. Stopped warfarin preop. Now okay to restart per Neurosurgery. Pharmacy consulted for warfarin management.  Home regimen: warfarin 2mg  on MWF, 1mg  on all other days  INR 1.3 today is subtherapeutic after receiving warfarin 2.5mg  x1 yesterday. Patient is on home amiodarone  and torsemide , no new drug interactions noted. Charted eating 75-100% of meals. Hgb of 9.4 seems near her baseline.  In previous notes, patient is noted to be sensitive to warfarin and is recently postop, so will dose conservatively.  Goal of Therapy:  INR 2-3 Monitor platelets by anticoagulation protocol: Yes   Plan:  Warfarin 2.5 mg x1 today Monitor daily INR, CBC, signs/symptoms of bleeding   Valarie Garner, PharmD PGY1 Pharmacy Resident  Please check AMION for all [facility] phone numbers After 10:00 PM, call Main Pharmacy [phone] 08/16/2023,8:18 AM

## 2023-08-16 NOTE — Progress Notes (Signed)
 Cx+ MSSA and Ecoli -d/c dapro and ctx -Start unasyn -lab called to set up cefazolin  disk for [place]

## 2023-08-16 NOTE — Progress Notes (Signed)
 Peripherally Inserted Central Catheter Placement  The IV Nurse has discussed with the patient and/or persons authorized to consent for the patient, the purpose of this procedure and the potential benefits and risks involved with this procedure.  The benefits include less needle sticks, lab draws from the catheter, and the patient may be discharged home with the catheter. Risks include, but not limited to, infection, bleeding, blood clot (thrombus formation), and puncture of an artery; nerve damage and irregular heartbeat and possibility to perform a PICC exchange if needed/ordered by physician.  Alternatives to this procedure were also discussed.  Bard Power PICC patient education guide, fact sheet on infection prevention and patient information card has been provided to patient /or left at bedside.    PICC Placement Documentation  PICC Single Lumen 08/16/23 Left Brachial 47 cm 0 cm (Active)  Indication for Insertion or Continuance of Line Home intravenous therapies (PICC only) 08/16/23 1858  Exposed Catheter (cm) 0 cm 08/16/23 1858  Site Assessment Clean, Dry, Intact 08/16/23 1858  Line Status Flushed;Saline locked;Blood return noted 08/16/23 1858  Dressing Type Transparent;Securing device 08/16/23 1858  Dressing Status Clean, Dry, Intact;Antimicrobial disc/dressing in place 08/16/23 1858  Line Care Connections checked and tightened 08/16/23 1858  Line Adjustment (NICU/IV Team Only) No 08/16/23 1858  Dressing Intervention New dressing;Adhesive placed at insertion site (IV team only);Adhesive placed around edges of dressing (IV team/ICU RN only) 08/16/23 1858  Dressing Change Due 08/23/23 08/16/23 1858       Allegra Arch 08/16/2023, 6:58 PM

## 2023-08-17 ENCOUNTER — Encounter (HOSPITAL_COMMUNITY): Payer: Self-pay | Admitting: Neurosurgery

## 2023-08-17 ENCOUNTER — Other Ambulatory Visit: Payer: Self-pay

## 2023-08-17 DIAGNOSIS — A498 Other bacterial infections of unspecified site: Secondary | ICD-10-CM | POA: Insufficient documentation

## 2023-08-17 DIAGNOSIS — B9620 Unspecified Escherichia coli [E. coli] as the cause of diseases classified elsewhere: Secondary | ICD-10-CM

## 2023-08-17 DIAGNOSIS — B999 Unspecified infectious disease: Secondary | ICD-10-CM

## 2023-08-17 DIAGNOSIS — T8131XA Disruption of external operation (surgical) wound, not elsewhere classified, initial encounter: Secondary | ICD-10-CM | POA: Diagnosis not present

## 2023-08-17 DIAGNOSIS — B9561 Methicillin susceptible Staphylococcus aureus infection as the cause of diseases classified elsewhere: Secondary | ICD-10-CM | POA: Diagnosis not present

## 2023-08-17 MED ORDER — WARFARIN SODIUM 2 MG PO TABS
2.0000 mg | ORAL_TABLET | Freq: Once | ORAL | Status: AC
  Administered 2023-08-17: 2 mg via ORAL
  Filled 2023-08-17: qty 1

## 2023-08-17 NOTE — Care Management Important Message (Signed)
 Important Message  Patient Details  Name: Natasha Lawson MRN: [id number] Date of Birth: 1940/08/03   Important Message Given:  Yes - Medicare IM     Wynonia Hedges 08/17/2023, 4:22 PM

## 2023-08-17 NOTE — Progress Notes (Signed)
 [facility]  Date of Admission:  08/13/2023     Reason for Follow Up: Wound dehiscence, surgical  Total days of antibiotics 5         ASSESSMENT:  Ms. Voight is postop day #4 from revision of posterior cervical wound with removal of instrumentation with cultures polymicrobial showing MSSA and E. coli.  Awaiting further sensitivities of E. coli prior to final antibiotic recommendations.  Continue postoperative wound care per neurosurgery.  PICC line care per protocol.  Continue current dose of ampicillin-sulbactam.  Standard/universal precautions.  Anticipate final antibiotic recommendations tomorrow.  Remaining medical and supportive care per primary team.    PLAN:  Continue current dose of ampicillin-sulbactam. Postoperative wound care per neurosurgery. Await sensitivities to E. coli for cefazolin  for final antibiotic recommendations. PICC line care per protocol. Standard/universal precautions. Remaining medical and supportive care per primary team.  Principal Problem:   Wound dehiscence, surgical Active Problems:   Staph aureus infection   E coli infection    amiodarone   200 mg Oral Daily   Chlorhexidine  Gluconate Cloth  6 each Topical Daily   cyanocobalamin   500 mcg Oral Daily   docusate sodium   100 mg Oral BID   febuxostat   40 mg Oral Daily   ferrous sulfate   325 mg Oral BID WC   nebivolol   10 mg Oral Daily   pantoprazole   40 mg Oral Daily   potassium chloride   10 mEq Oral BID   sertraline   100 mg Oral Q breakfast   sodium chloride  flush  10-40 mL Intracatheter Q12H   sodium chloride  flush  3 mL Intravenous Q12H   torsemide   20 mg Oral Q breakfast   Warfarin - Pharmacist Dosing Inpatient   Does not apply q1600    SUBJECTIVE:  Afebrile overnight with no acute events.  PICC line in place.  Husband visiting at bedside.  Tolerating antibiotics with no adverse side effects.  Allergies  Allergen Reactions   Capoten [Captopril] Swelling     Tongue    Iodine Hives   Norvasc [Amlodipine Besylate] Swelling   Shellfish Allergy Hives   Terazosin Hcl Other (See Comments)    Caused a-fib   Xarelto [Rivaroxaban] Other (See Comments)    Severe back pain   Welchol [Colesevelam Hcl] Other (See Comments)    Confusion    Crestor [Rosuvastatin Calcium] Other (See Comments)    Muscle aches   Fenofibrate Other (See Comments)    Muscle aches   Metoprolol Palpitations   Pravachol [Pravastatin Sodium] Other (See Comments)    fatigue   Spironolactone Diarrhea   Statins Other (See Comments)    Joint pain     Review of Systems: Review of Systems  Constitutional:  Negative for chills, fever and weight loss.  Respiratory:  Negative for cough, shortness of breath and wheezing.   Cardiovascular:  Negative for chest pain and leg swelling.  Gastrointestinal:  Negative for abdominal pain, constipation, diarrhea, nausea and vomiting.  Skin:  Negative for rash.      OBJECTIVE: Vitals:   08/16/23 1204 08/16/23 1701 08/16/23 2106 08/17/23 0337  BP: 125/61 (!) 130/53 (!) 132/58 132/70  Pulse: 81 91 79 82  Resp: 14 14 16 18   Temp: 97.6 F (36.4 C) 98 F (36.7 C) 98.4 F (36.9 C) (!) 97.5 F (36.4 C)  TempSrc: Oral Oral Oral Oral  SpO2: 95% 97% 95% 94%  Weight:      Height:       Body  mass index is 35.24 kg/m.  Physical Exam Constitutional:      General: She is not in acute distress.    Appearance: She is well-developed.  Cardiovascular:     Rate and Rhythm: Normal rate and regular rhythm.     Heart sounds: Normal heart sounds.     Comments: PICC line left upper extremity. Pulmonary:     Effort: Pulmonary effort is normal.     Breath sounds: Normal breath sounds.  Skin:    General: Skin is warm and dry.  Neurological:     Mental Status: She is alert and oriented to person, place, and time.     Lab Results Lab Results  Component Value Date   WBC 6.0 08/16/2023   HGB 9.4 (L) 08/16/2023   HCT 29.3 (L) 08/16/2023    MCV 102.4 (H) 08/16/2023   PLT 124 (L) 08/16/2023    Lab Results  Component Value Date   CREATININE 1.17 (H) 08/14/2023   BUN 19 08/14/2023   NA 141 08/14/2023   K 4.0 08/14/2023   CL 105 08/14/2023   CO2 23 08/14/2023    Lab Results  Component Value Date   ALT 15 06/25/2021   AST 17 06/25/2021   ALKPHOS 124 06/25/2021   BILITOT 0.6 06/25/2021     Microbiology: Recent Results (from the past 240 hours)  Aerobic/Anaerobic Culture w Gram Stain (surgical/deep wound)     Status: None (Preliminary result)   Collection Time: 08/13/23  9:18 AM   Specimen: PATH Soft tissue  Result Value Ref Range Status   Specimen Description WOUND  Final   Special Requests POSTERIOR CERVICAL  Final   Gram Stain NO WBC SEEN NO ORGANISMS SEEN   Final   Culture   Final    NO GROWTH 4 DAYS NO ANAEROBES ISOLATED; CULTURE IN PROGRESS FOR 5 DAYS Performed at [facility], [address], [place], [place] [address]    Report Status PENDING  Incomplete  Aerobic/Anaerobic Culture w Gram Stain (surgical/deep wound)     Status: None (Preliminary result)   Collection Time: 08/13/23  9:38 AM   Specimen: PATH Soft tissue  Result Value Ref Range Status   Specimen Description TISSUE  Final   Special Requests SINUS TRACT  Final   Gram Stain   Final    RARE WBC PRESENT, PREDOMINANTLY PMN NO ORGANISMS SEEN    Culture   Final    RARE STAPHYLOCOCCUS AUREUS NO ANAEROBES ISOLATED; CULTURE IN PROGRESS FOR 5 DAYS    Report Status PENDING  Incomplete   Organism ID, Bacteria STAPHYLOCOCCUS AUREUS  Final      Susceptibility   Staphylococcus aureus - MIC*    CIPROFLOXACIN 1 SENSITIVE Sensitive     ERYTHROMYCIN <=0.25 SENSITIVE Sensitive     GENTAMICIN <=0.5 SENSITIVE Sensitive     OXACILLIN 0.5 SENSITIVE Sensitive     TETRACYCLINE <=1 SENSITIVE Sensitive     VANCOMYCIN  1 SENSITIVE Sensitive     TRIMETH/SULFA <=10 SENSITIVE Sensitive     CLINDAMYCIN <=0.25 SENSITIVE Sensitive     RIFAMPIN <=0.5  SENSITIVE Sensitive     Inducible Clindamycin NEGATIVE Sensitive     LINEZOLID Value in next row Sensitive      2 SENSITIVEPerformed at [facility], [address], [place], [place] [address]    * RARE STAPHYLOCOCCUS AUREUS  Aerobic/Anaerobic Culture w Gram Stain (surgical/deep wound)     Status: None (Preliminary result)   Collection Time: 08/13/23  9:38 AM  Specimen: PATH Soft tissue  Result Value Ref Range Status   Specimen Description TISSUE  Final   Special Requests POSTERIOR CERVICAL INSTRUMENTATION  Final   Gram Stain   Final    NO WBC SEEN NO ORGANISMS SEEN Performed at [facility], [address] [place], [place] [address]    Culture   Final    RARE ESCHERICHIA COLI NO ANAEROBES ISOLATED; CULTURE IN PROGRESS FOR 5 DAYS    Report Status PENDING  Incomplete   Organism ID, Bacteria ESCHERICHIA COLI  Final      Susceptibility   Escherichia coli - MIC*    AMPICILLIN >=32 RESISTANT Resistant     CEFEPIME <=0.12 SENSITIVE Sensitive     CEFTAZIDIME <=1 SENSITIVE Sensitive     CEFTRIAXONE  <=0.25 SENSITIVE Sensitive     CIPROFLOXACIN <=0.25 SENSITIVE Sensitive     GENTAMICIN <=1 SENSITIVE Sensitive     IMIPENEM <=0.25 SENSITIVE Sensitive     TRIMETH/SULFA <=20 SENSITIVE Sensitive     AMPICILLIN/SULBACTAM 8 SENSITIVE Sensitive     PIP/TAZO <=4 SENSITIVE Sensitive ug/mL    * RARE ESCHERICHIA COLI     Greg Elber Galyean, NP [facility]  08/17/2023  12:52 PM

## 2023-08-17 NOTE — Progress Notes (Signed)
 Physical Therapy Treatment Patient Details Name: Natasha Lawson MRN: [id number] DOB: November 16, 1940 Today's Date: 08/17/2023   History of Present Illness 83 y.o. y.o. female with a history multiple posterior cervical surgeries with the most recent being PCDF C3-5 in July of 2023 by Dr. Ali Antonio. She developed a dehiscence with chronic drainage at lower aspect of wound early Jan 2025.  She is s/p removal of posterior cervical instrumentation and revision of cervical wound. PMH includes: anemia, anxiety, R breast CA, CAD, gout, HTN, prior cervical surgery.    PT Comments  Patient resting in bed and agreeable to mobilize with therapy. Verbalized 3/3 cervical spine precautions without prompting. Pt required min assist to fully raise trunk and pivot to sit EOB, able to maintain balance in sitting. CGA for safety with sit<>stand from EOB and stable in standing. Pt maintained safe proximity to RW throughout with no buckling or LOB noted. Pt performed single step up with RW for bil UE support, has 2 hand rails at home. No overt LOB, improved rise leading with Rt LE. EOS pt remained in recliner, Alarm on and call bell within reach, and spouse present. Will continue to progress as able during acute stay.     If plan is discharge home, recommend the following: A little help with walking and/or transfers;Assistance with cooking/housework;Assist for transportation;Help with stairs or ramp for entrance   Can travel by private vehicle        Equipment Recommendations  None recommended by PT    Recommendations for Other Services       Precautions / Restrictions Precautions Precautions: Fall;Cervical Precaution Booklet Issued: Yes (comment) Recall of Precautions/Restrictions: Intact Precaution/Restrictions Comments: reviewed C spine precautions. Required Braces or Orthoses: Other Brace Other Brace: no brace needed Restrictions Weight Bearing Restrictions Per Provider Order: No     Mobility  Bed  Mobility Overal bed mobility: Needs Assistance Bed Mobility: Supine to Sit     Supine to sit: Min assist, Used rails, HOB elevated     General bed mobility comments: pt recall 3/3 cervical spine precautions, min assist to maintain and pt with difficulty to raise trunk upright. bed pad to pivot hips and center at EOB.    Transfers Overall transfer level: Needs assistance Equipment used: Rolling walker (2 wheels) Transfers: Sit to/from Stand Sit to Stand: Contact guard assist           General transfer comment: cues for hand placement and pt able to power up wtih bil UE to RW. extra time to rise, steady once standing.    Ambulation/Gait Ambulation/Gait assistance: Contact guard assist Gait Distance (Feet): 120 Feet Assistive device: Rolling walker (2 wheels) Gait Pattern/deviations: Step-through pattern, Decreased stride length, Trunk flexed Gait velocity: Decreased     General Gait Details: overall steady, cautious pace. no overt LOB, trunk slightly flexed and low step height and length.   Stairs Stairs: Yes Stairs assistance: Contact guard assist, Min assist Stair Management: Step to pattern, Forwards, Backwards, With walker Number of Stairs: 2 General stair comments: pt completed single step up in room with RW for bil UE support. pt ascend with Rt 1x and Lt 1x with backwards step down. light assist/CGA.   Wheelchair Mobility     Tilt Bed    Modified Rankin (Stroke Patients Only)       Balance  Communication Communication Communication: No apparent difficulties  Cognition Arousal: Alert Behavior During Therapy: WFL for tasks assessed/performed   PT - Cognitive impairments: No apparent impairments                         Following commands: Intact      Cueing Cueing Techniques: Verbal cues  Exercises      General Comments        Pertinent Vitals/Pain Pain Assessment Pain  Assessment: Faces Faces Pain Scale: Hurts a little bit Pain Location: neck, incision site Pain Descriptors / Indicators: Aching, Sore Pain Intervention(s): Limited activity within patient's tolerance, Monitored during session, Repositioned    Home Living Family/patient expects to be discharged to:: Private residence Living Arrangements: Spouse/significant other                      Prior Function            PT Goals (current goals can now be found in the care plan section) Acute Rehab PT Goals Patient Stated Goal: to return home PT Goal Formulation: With patient Time For Goal Achievement: 08/28/23 Potential to Achieve Goals: Fair Progress towards PT goals: Progressing toward goals    Frequency    Min 2X/week      PT Plan      Co-evaluation              AM-PAC PT "6 Clicks" Mobility   Outcome Measure  Help needed turning from your back to your side while in a flat bed without using bedrails?: A Little Help needed moving from lying on your back to sitting on the side of a flat bed without using bedrails?: A Little Help needed moving to and from a bed to a chair (including a wheelchair)?: A Little Help needed standing up from a chair using your arms (e.g., wheelchair or bedside chair)?: A Little Help needed to walk in hospital room?: A Little Help needed climbing 3-5 steps with a railing? : A Little 6 Click Score: 18    End of Session Equipment Utilized During Treatment: Gait belt Activity Tolerance: Patient tolerated treatment well Patient left: in chair;with call bell/phone within reach;with family/visitor present Nurse Communication: Mobility status PT Visit Diagnosis: Other abnormalities of gait and mobility (R26.89);Muscle weakness (generalized) (M62.81);Unsteadiness on feet (R26.81)     Time: 1610-9604 PT Time Calculation (min) (ACUTE ONLY): 23 min  Charges:    $Gait Training: 8-22 mins $Therapeutic Activity: 8-22 mins PT General  Charges $$ ACUTE PT VISIT: 1 Visit                     Tish Forge, DPT Acute Rehabilitation Services Office [phone]  08/17/23 2:08 PM

## 2023-08-17 NOTE — TOC Progression Note (Signed)
 Transition of Care [facility]) - Progression Note    Patient Details  Name: Natasha Lawson MRN: [id number] Date of Birth: [birth year]  Transition of Care [facility] CM/SW Contact  Jonathan Neighbor, RN Phone Number: 08/17/2023, 2:35 PM  Clinical Narrative:     Plan is for home tomorrow with IV abx through [facility]. Pam with [facility] to educate the patient and her spouse.  Pt is already [facility] with [facility] services and they will see her for the [facility] RN IV abx.  TOC following.  Expected Discharge Plan: Home w Home Health Services Barriers to Discharge: Continued Medical Work up  Expected Discharge Plan and Services   Discharge Planning Services: CM Consult Post Acute Care Choice: Home Health Living arrangements for the past 2 months: Single Family Home                           HH Arranged: RN           Social Determinants of Health (SDOH) Interventions SDOH Screenings   Food Insecurity: No Food Insecurity (08/14/2023)  Housing: Low Risk  (08/17/2023)  Transportation Needs: No Transportation Needs (08/14/2023)  Utilities: Not At Risk (08/14/2023)  Depression (PHQ2-9): Low Risk  (05/02/2019)  Social Connections: Socially Integrated (08/14/2023)  Tobacco Use: Low Risk  (08/17/2023)    Readmission Risk Interventions     No data to display

## 2023-08-17 NOTE — Progress Notes (Signed)
 PHARMACY - ANTICOAGULATION CONSULT NOTE  Pharmacy Consult for warfarin Indication: atrial fibrillation  Allergies  Allergen Reactions   Capoten [Captopril] Swelling    Tongue    Iodine Hives   Norvasc [Amlodipine Besylate] Swelling   Shellfish Allergy Hives   Terazosin Hcl Other (See Comments)    Caused a-fib   Xarelto [Rivaroxaban] Other (See Comments)    Severe back pain   Welchol [Colesevelam Hcl] Other (See Comments)    Confusion    Crestor [Rosuvastatin Calcium] Other (See Comments)    Muscle aches   Fenofibrate Other (See Comments)    Muscle aches   Metoprolol Palpitations   Pravachol [Pravastatin Sodium] Other (See Comments)    fatigue   Spironolactone Diarrhea   Statins Other (See Comments)    Joint pain    Patient Measurements: Height: 5\' 7"  (170.2 cm) Weight: 102.1 kg (225 lb) IBW/kg (Calculated) : 61.6 HEPARIN  DW (KG): 84.5  Vital Signs: Temp: 97.5 F (36.4 C) (05/05 0337) Temp Source: Oral (05/05 0337) BP: 132/70 (05/05 0337) Pulse Rate: 82 (05/05 0337)  Labs: Recent Labs    08/15/23 1327 08/16/23 0651  HGB  --  9.4*  HCT  --  29.3*  PLT  --  124*  LABPROT 15.2 16.3*  INR 1.2 1.3*    Estimated Creatinine Clearance: 47.1 mL/min (A) (by C-G formula based on SCr of 1.17 mg/dL (H)).   Assessment: 83 yo F presenting for wound debridement and hardware removal. PMH of atrial fibrillation on warfarin. Stopped warfarin preop. Now okay to restart per Neurosurgery. Pharmacy consulted for warfarin management.  Home regimen: warfarin 2mg  on MWF, 1mg  on all other days  INR not drawn today s/p warfarin 2.5mg  x2 doses. Patient is on home amiodarone  and torsemide , no new drug interactions noted. Hgb of 9.4 near her baseline - no new CBC today.   In previous notes, patient is noted to be sensitive to warfarin and is recently postop, so will dose conservatively.  Goal of Therapy:  INR 2-3 Monitor platelets by anticoagulation protocol: Yes   Plan:   Warfarin 2 mg PO x1 today Monitor daily INR, CBC, signs/symptoms of bleeding  Thank you for involving pharmacy in this patient's care.  Caroline Cinnamon, PharmD, BCPS Clinical Pharmacist Clinical phone for 08/17/2023 is [phone] 08/17/2023 1:10 PM

## 2023-08-17 NOTE — Progress Notes (Signed)
    Providing Compassionate, Quality Care - Together   NEUROSURGERY PROGRESS NOTE     S: No issues overnight.    O: EXAM:  BP 132/70 (BP Location: Left Arm)   Pulse 82   Temp (!) 97.5 F (36.4 C) (Oral)   Resp 18   Ht 5\' 7"  (1.702 m)   Wt 102.1 kg   LMP  (LMP Unknown)   SpO2 94%   BMI 35.24 kg/m     Awake, alert, oriented  Speech fluent, appropriate  MAEs SILTx4 Dressing c/d/i   ASSESSMENT:  83 y.o.  s/p posterior cervical wound exploration and hardware removal     PLAN: -Appreciate ID mgmt -Likely dc to home tmrw pending PICC placement/patency.  -Cont supportive care -Call w/ questions/concerns.   Easter Golden, [facility]

## 2023-08-18 ENCOUNTER — Other Ambulatory Visit (HOSPITAL_COMMUNITY): Payer: Self-pay

## 2023-08-18 ENCOUNTER — Inpatient Hospital Stay (HOSPITAL_COMMUNITY)

## 2023-08-18 DIAGNOSIS — B9620 Unspecified Escherichia coli [E. coli] as the cause of diseases classified elsewhere: Secondary | ICD-10-CM | POA: Diagnosis not present

## 2023-08-18 DIAGNOSIS — B9561 Methicillin susceptible Staphylococcus aureus infection as the cause of diseases classified elsewhere: Secondary | ICD-10-CM | POA: Diagnosis not present

## 2023-08-18 DIAGNOSIS — B999 Unspecified infectious disease: Secondary | ICD-10-CM | POA: Diagnosis not present

## 2023-08-18 DIAGNOSIS — T8131XA Disruption of external operation (surgical) wound, not elsewhere classified, initial encounter: Secondary | ICD-10-CM | POA: Diagnosis not present

## 2023-08-18 LAB — AEROBIC/ANAEROBIC CULTURE W GRAM STAIN (SURGICAL/DEEP WOUND)
Culture: NO GROWTH
Gram Stain: NONE SEEN
Gram Stain: NONE SEEN

## 2023-08-18 LAB — CBC
HCT: 29.3 % — ABNORMAL LOW (ref 36.0–46.0)
Hemoglobin: 9.2 g/dL — ABNORMAL LOW (ref 12.0–15.0)
MCH: 31.9 pg (ref 26.0–34.0)
MCHC: 31.4 g/dL (ref 30.0–36.0)
MCV: 101.7 fL — ABNORMAL HIGH (ref 80.0–100.0)
Platelets: 135 10*3/uL — ABNORMAL LOW (ref 150–400)
RBC: 2.88 MIL/uL — ABNORMAL LOW (ref 3.87–5.11)
RDW: 14.1 % (ref 11.5–15.5)
WBC: 5.2 10*3/uL (ref 4.0–10.5)
nRBC: 0 % (ref 0.0–0.2)

## 2023-08-18 LAB — PROTIME-INR
INR: 1.4 — ABNORMAL HIGH (ref 0.8–1.2)
Prothrombin Time: 17.4 s — ABNORMAL HIGH (ref 11.4–15.2)

## 2023-08-18 MED ORDER — SODIUM CHLORIDE 0.9 % IV SOLN
1.0000 g | INTRAVENOUS | Status: DC
  Administered 2023-08-18: 1 g via INTRAVENOUS
  Filled 2023-08-18 (×2): qty 1000

## 2023-08-18 MED ORDER — LIDOCAINE HCL 1 % IJ SOLN
INTRAMUSCULAR | Status: AC
  Filled 2023-08-18: qty 20

## 2023-08-18 MED ORDER — METHOCARBAMOL 500 MG PO TABS
500.0000 mg | ORAL_TABLET | Freq: Four times a day (QID) | ORAL | 0 refills | Status: AC | PRN
Start: 2023-08-18 — End: ?
  Filled 2023-08-18: qty 120, 30d supply, fill #0

## 2023-08-18 MED ORDER — ERTAPENEM IV (FOR PTA / DISCHARGE USE ONLY): 1.0000 g | INTRAVENOUS | 0 refills | Status: AC

## 2023-08-18 MED ORDER — WARFARIN SODIUM 1 MG PO TABS
1.0000 mg | ORAL_TABLET | Freq: Once | ORAL | Status: AC
  Administered 2023-08-18: 1 mg via ORAL
  Filled 2023-08-18: qty 1

## 2023-08-18 MED ORDER — HEPARIN SOD (PORK) LOCK FLUSH 100 UNIT/ML IV SOLN
250.0000 [IU] | INTRAVENOUS | Status: AC | PRN
  Administered 2023-08-18: 250 [IU]

## 2023-08-18 MED ORDER — LIDOCAINE HCL 1 % IJ SOLN
10.0000 mL | Freq: Once | INTRAMUSCULAR | Status: DC
  Filled 2023-08-18: qty 10

## 2023-08-18 MED ORDER — OXYCODONE HCL 5 MG PO TABS
5.0000 mg | ORAL_TABLET | ORAL | 0 refills | Status: AC | PRN
Start: 2023-08-18 — End: ?
  Filled 2023-08-18: qty 30, 5d supply, fill #0

## 2023-08-18 NOTE — Discharge Summary (Signed)
 Patient ID: Natasha Lawson MRN: [id number] DOB/AGE: July 26, 1940 83 y.o.  Admit date: 08/13/2023 Discharge date: 08/18/2023  Admission Diagnoses: Dehiscence of operative wound, subsequent encounter [T81.31XD] Wound dehiscence, surgical [T81.31XA]   Discharge Diagnoses: Same   Discharged Condition: Stable  Hospital Course:  Natasha Lawson is a 83 y.o. female who was admitted following an uncomplicated exploration of posterior cervical wound and removal of hardware for dehiscence of surgical wound and concern for infection. They were recovered in PACU and transferred to the floor. Infectious disease was consulted and ultimately recommended PICC line placement for IV abx for 6-8 weeks post operatively for wound cultures positive for E. Coli. Pt stable for discharge today after insertion of patent/functional PICC vs tunneled central catheter given first attempt at PICC was unsuccessful. Pt to f/u in office for routine post op visit. Pt is in agreement w/ plan.    Discharge Exam: Blood pressure (!) 158/79, pulse (!) 57, temperature 98.1 F (36.7 C), temperature source Oral, resp. rate 14, height 5\' 7"  (1.702 m), weight 102.1 kg, SpO2 98%.   Disposition: Discharge disposition: 01-Home or Self Care       Discharge Instructions     [facility] pharmacist to adjust dose for Vancomycin , Aminoglycosides and other anti-infective therapies as requested by physician.   Complete by: As directed    [facility] to provide Cath Flo 2mg    Complete by: As directed    Administer for PICC line occlusion and as ordered by physician for other access device issues.   Anaphylaxis Kit: Provided to treat any anaphylactic reaction to the medication being provided to the patient if First Dose or when requested by physician   Complete by: As directed    Epinephrine  1mg /ml vial / amp: Administer 0.3mg  (0.32ml) subcutaneously once for moderate to severe anaphylaxis, nurse to call physician and  pharmacy when reaction occurs and call 911 if needed for immediate care   Diphenhydramine  50mg /ml IV vial: Administer 25-50mg  IV/IM PRN for first dose reaction, rash, itching, mild reaction, nurse to call physician and pharmacy when reaction occurs   Sodium Chloride  0.9% NS 500ml IV: Administer if needed for hypovolemic blood pressure drop or as ordered by physician after call to physician with anaphylactic reaction   Change dressing on IV access line weekly and PRN   Complete by: As directed    Flush IV access with Sodium Chloride  0.9% and Heparin  10 units/ml or 100 units/ml   Complete by: As directed    Home infusion instructions - [facility]   Complete by: As directed    Instructions: Flush IV access with Sodium Chloride  0.9% and Heparin  10units/ml or 100units/ml   Change dressing on IV access line: Weekly and PRN   Instructions Cath Flo 2mg : Administer for PICC Line occlusion and as ordered by physician for other access device   [facility] pharmacist to adjust dose for: Vancomycin , Aminoglycosides and other anti-infective therapies as requested by physician   If the dressing is still on your incision site when you go home, remove it on the third day after your surgery date. Remove dressing if it begins to fall off, or if it is dirty or damaged before the third day.   Complete by: As directed    Incentive spirometry RT   Complete by: As directed    Method of administration may be changed at the discretion of home infusion pharmacist based upon assessment of the patient and/or caregiver's ability to self-administer the medication ordered  Complete by: As directed       Allergies as of 08/18/2023       Reactions   Capoten [captopril] Swelling   Tongue   Iodine Hives   Norvasc [amlodipine Besylate] Swelling   Shellfish Allergy Hives   Terazosin Hcl Other (See Comments)   Caused a-fib   Xarelto [rivaroxaban] Other (See Comments)   Severe back pain   Welchol  [colesevelam Hcl] Other (See Comments)   Confusion   Crestor [rosuvastatin Calcium] Other (See Comments)   Muscle aches   Fenofibrate Other (See Comments)   Muscle aches   Metoprolol Palpitations   Pravachol [pravastatin Sodium] Other (See Comments)   fatigue   Spironolactone Diarrhea   Statins Other (See Comments)   Joint pain        Medication List     TAKE these medications    amiodarone  200 MG tablet Commonly known as: PACERONE  Take 200 mg by mouth daily.   aspirin EC 81 MG tablet Take 81 mg by mouth daily. Swallow whole.   DSS 100 MG Caps Take 100 mg by mouth 2 (two) times daily as needed (constipation).   ertapenem IVPB Commonly known as: INVANZ Inject 1 g into the vein daily. Indication:  MSSA/E coli cervical spine infection  First Dose: Yes Last Day of Therapy:  10/08/23 Labs - Once weekly:  CBC/D and BMP, Labs - Once weekly: ESR and CRP Method of administration: Mini-Bag Plus / Gravity Method of administration may be changed at the discretion of home infusion pharmacist based upon assessment of the patient and/or caregiver's ability to self-administer the medication ordered.   febuxostat  40 MG tablet Commonly known as: ULORIC  Take 40 mg by mouth daily.   ferrous sulfate  325 (65 FE) MG tablet Take 325 mg by mouth 2 (two) times daily with a meal.   iron  polysaccharides 150 MG capsule Commonly known as: NIFEREX Take 1 capsule (150 mg total) by mouth 2 (two) times daily with a meal.   methocarbamol  500 MG tablet Commonly known as: ROBAXIN  Take 1 tablet (500 mg total) by mouth every 6 (six) hours as needed for muscle spasms.   nebivolol  10 MG tablet Commonly known as: BYSTOLIC  Take 10 mg by mouth daily.   omeprazole 20 MG capsule Commonly known as: PRILOSEC Take 20 mg by mouth daily before breakfast.   Oncovite Tabs Take 1 tablet by mouth daily.   PreserVision AREDS 2 Caps Take 1 capsule by mouth in the morning and at bedtime.   oxyCODONE  5 MG  immediate release tablet Commonly known as: Oxy IR/ROXICODONE  Take 1 tablet (5 mg total) by mouth every 4 (four) hours as needed for moderate pain (pain score 4-6).   potassium chloride  10 MEQ CR capsule Commonly known as: MICRO-K  Take 10 mEq by mouth 2 (two) times daily.   PRESCRIPTION MEDICATION Pt uses a cpap nightly   sertraline  100 MG tablet Commonly known as: ZOLOFT  Take 100 mg by mouth daily with breakfast.   torsemide  20 MG tablet Commonly known as: DEMADEX  Take 20 mg by mouth daily with breakfast.   vitamin B-12 500 MCG tablet Commonly known as: CYANOCOBALAMIN  Take 500 mcg by mouth daily.   Vitamin D  (Ergocalciferol ) 1.25 MG (50000 UNIT) Caps capsule Commonly known as: DRISDOL  Take 50,000 Units by mouth every Friday.   warfarin 2 MG tablet Commonly known as: COUMADIN  Take 1-2 mg by mouth at bedtime. Take 2 mg by mouth on Monday, Wednesday and Friday and take 1 mg on Tuesday,  Thursday, Saturday and Sunday               Discharge Care Instructions  (From admission, onward)           Start     Ordered   08/18/23 0000  Change dressing on IV access line weekly and PRN  (Home infusion instructions - [facility] )        08/18/23 1338   08/18/23 0000  If the dressing is still on your incision site when you go home, remove it on the third day after your surgery date. Remove dressing if it begins to fall off, or if it is dirty or damaged before the third day.        05 /06/25 1616            Follow-up Information     Orlie Bjornstad, MD Follow up.   Specialty: Infectious Diseases Why: 09/10/2023 at 3:45 PM.  Please call to reschedule if you are not able to make this appointment.  Okay for [employer] information: [address] [place], [address] [place] [address] [phone]                 Signed: Diane Mochizuki CAYLIN Keri Veale 08/18/2023, 4:16 PM

## 2023-08-18 NOTE — Procedures (Signed)
 Left basilic vein single lumen PICC placed without immediate complications. Length 43 cm. Advanced to lower SVC / right atrial junction.  EBL < 3 ml.  Local anesthetic with 1% lidocaine . Patency verified with blood return and flush. Well tolerated by patient. Ok to use.   Performed by Terressa Fess, NP under the direct supervision of Dr. Elene Griffes.

## 2023-08-18 NOTE — Progress Notes (Signed)
 PHARMACY - ANTICOAGULATION CONSULT NOTE  Pharmacy Consult for warfarin Indication: atrial fibrillation  Allergies  Allergen Reactions   Capoten [Captopril] Swelling    Tongue    Iodine Hives   Norvasc [Amlodipine Besylate] Swelling   Shellfish Allergy Hives   Terazosin Hcl Other (See Comments)    Caused a-fib   Xarelto [Rivaroxaban] Other (See Comments)    Severe back pain   Welchol [Colesevelam Hcl] Other (See Comments)    Confusion    Crestor [Rosuvastatin Calcium] Other (See Comments)    Muscle aches   Fenofibrate Other (See Comments)    Muscle aches   Metoprolol Palpitations   Pravachol [Pravastatin Sodium] Other (See Comments)    fatigue   Spironolactone Diarrhea   Statins Other (See Comments)    Joint pain    Patient Measurements: Height: 5\' 7"  (170.2 cm) Weight: 102.1 kg (225 lb) IBW/kg (Calculated) : 61.6 HEPARIN  DW (KG): 84.5  Vital Signs: Temp: 98.1 F (36.7 C) (05/06 1308) Temp Source: Oral (05/06 1308) BP: 158/79 (05/06 1308) Pulse Rate: 57 (05/06 1308)  Labs: Recent Labs    08/16/23 0651 08/18/23 0530  HGB 9.4* 9.2*  HCT 29.3* 29.3*  PLT 124* 135*  LABPROT 16.3* 17.4*  INR 1.3* 1.4*    Estimated Creatinine Clearance: 47.1 mL/min (A) (by C-G formula based on SCr of 1.17 mg/dL (H)).   Assessment: 83 yo F presenting for wound debridement and hardware removal. PMH of atrial fibrillation on warfarin. Stopped warfarin preop. Restarted 5/3 per Neurosurgery. Pharmacy consulted for warfarin management.  Home regimen: warfarin 2mg  on MWF, 1mg  on all other days  INR 1.4, slowly riging s/p warfarin 2.5mg  x2 doses, 2mg  x 1 dose. Anticipate INR to continue to rise.  Patient is on home amiodarone  and torsemide , no new drug interactions noted. Hgb of 9.4 near her baseline - no new CBC today.   In previous notes, patient is noted to be sensitive to warfarin and is recently postop, so will dose conservatively.  Goal of Therapy:  INR 2-3 Monitor  platelets by anticoagulation protocol: Yes   Plan:  Warfarin 1 mg PO x1 today Will schedule for 6pm following PICC line placement in IR Monitor daily INR, CBC, signs/symptoms of bleeding  Thank you for involving pharmacy in this patient's care.  Larue Pock, PharmD, BCPS Clinical Pharmacist Clinical phone for 08/18/2023 is [phone] 08/18/2023 3:57 PM

## 2023-08-18 NOTE — Progress Notes (Signed)
 PHARMACY CONSULT NOTE FOR:  OUTPATIENT  PARENTERAL ANTIBIOTIC THERAPY (OPAT)  Indication: MSSA/E coli cervical wound infection  Regimen: Ertapenem 1 gm every 24 hours  End date: 10/08/23  IV antibiotic discharge orders are pended. To discharging provider:  please sign these orders via discharge navigator,  Select New Orders & click on the button choice - Manage This Unsigned Work.     Thank you for allowing pharmacy to be a part of this patient's care.  Denson Flake, PharmD, BCPS, BCIDP Infectious Diseases Clinical Pharmacist Phone: [phone] 08/18/2023, 1:30 PM

## 2023-08-18 NOTE — TOC Transition Note (Signed)
 Transition of Care [facility]) - Discharge Note   Patient Details  Name: Natasha Lawson MRN: [id number] Date of Birth: Jul 12, 1940  [facility] CM/SW Contact:  Jonathan Neighbor, RN Phone Number: 08/18/2023, 4:11 PM   Clinical Narrative:     Pt is discharging home with home health through [facility]. They are aware of d/c home today.  IV abx to be delivered to the home per [employer]. Education has been done per Pam with [employer] on home IV abx.  Spouse to provide transport home.  Final next level of care: Home w Home Health Services Barriers to Discharge: No Barriers Identified   Patient Goals and CMS Choice   CMS Medicare.gov Compare Post Acute Care list provided to:: Patient Choice offered to / list presented to : Patient      Discharge Placement                       Discharge Plan and Services Additional resources added to the After Visit Summary for     Discharge Planning Services: CM Consult Post Acute Care Choice: Home Health                    HH Arranged: RN          Social Drivers of Health (SDOH) Interventions SDOH Screenings   Food Insecurity: No Food Insecurity (08/14/2023)  Housing: Low Risk  (08/17/2023)  Transportation Needs: No Transportation Needs (08/14/2023)  Utilities: Not At Risk (08/14/2023)  Depression (PHQ2-9): Low Risk  (05/02/2019)  Social Connections: Socially Integrated (08/14/2023)  Tobacco Use: Low Risk  (08/17/2023)     Readmission Risk Interventions     No data to display

## 2023-08-18 NOTE — Progress Notes (Signed)
 Consulted for L SL Picc, not flushing; Noted Picc is positional upon assessment; blood return only when skin is pulled down from site. Same issue  may occur. RN stated pt is going home today w/ Picc. IVT picc RN has evaluated the picc; see notes from 5/4. Same recommendation given to unit Rn.,

## 2023-08-18 NOTE — Progress Notes (Signed)
 Occupational Therapy Treatment and sign off.  Patient Details Name: Natasha Lawson MRN: [id number] DOB: 1940/11/09 Today's Date: 08/18/2023   History of present illness 83 y.o. y.o. female with a history multiple posterior cervical surgeries with the most recent being PCDF C3-5 in July of 2023 by Dr. Ali Antonio. She developed a dehiscence with chronic drainage at lower aspect of wound early Jan 2025.  She is s/p removal of posterior cervical instrumentation and revision of cervical wound. PMH includes: anemia, anxiety, R breast CA, CAD, gout, HTN, prior cervical surgery.   OT comments  Pt progressing towards OT goals this session, we talked in length about maintaining cervical precautions during functional ADL and mobility - and she is comfortable and has adequate DME/AE at home. Her husband is purchasing a special bed pad that will help her rotate her hips EOB as she is still requiring min A to complete this. She is GCA with RW for in room mobility and toilet transfers, but is able to complete peri care and standing grooming at sink without physical assist and maintaining cervical precautions. All education complete and OT will sign off at this time as Pt has no further questions or concerns.       If plan is discharge home, recommend the following:  A little help with walking and/or transfers;A lot of help with bathing/dressing/bathroom;Assistance with cooking/housework;Assist for transportation;Help with stairs or ramp for entrance   Equipment Recommendations  None recommended by OT (Pt has appropriate DME/AE)    Recommendations for Other Services      Precautions / Restrictions Precautions Precautions: Fall;Cervical Recall of Precautions/Restrictions: Intact Other Brace: no brace needed Restrictions Weight Bearing Restrictions Per Provider Order: No       Mobility Bed Mobility Overal bed mobility: Needs Assistance Bed Mobility: Supine to Sit     Supine to sit: Min assist, Used  rails, HOB elevated Sit to supine: Min assist, HOB elevated, Used rails   General bed mobility comments: pt recall 3/3 cervical spine precautions, min assist to maintain and pt with difficulty to raise trunk upright. bed pad to pivot hips and center at EOB. - reports that her husband has purchased a "bed pad with handles" to assist with bringing her hips EOB    Transfers Overall transfer level: Needs assistance Equipment used: Rolling walker (2 wheels) Transfers: Sit to/from Stand Sit to Stand: Contact guard assist           General transfer comment: no physical assist to rise, GCA for safety, vs for safe hand placement     Balance Overall balance assessment: Needs assistance Sitting-balance support: Feet supported, No upper extremity supported Sitting balance-Leahy Scale: Good     Standing balance support: Reliant on assistive device for balance, Bilateral upper extremity supported, During functional activity Standing balance-Leahy Scale: Poor                             ADL either performed or assessed with clinical judgement   ADL Overall ADL's : Needs assistance/impaired     Grooming: Contact guard assist;Wash/dry hands;Standing Grooming Details (indicate cue type and reason): sink level                 Toilet Transfer: Contact guard assist;Rolling walker (2 wheels);Ambulation Toilet Transfer Details (indicate cue type and reason): into bathroom, use of grab bar once inside Toileting- Clothing Manipulation and Hygiene: Contact guard assist;Sit to/from stand       Functional mobility  during ADLs: Contact guard assist;Rolling walker (2 wheels)      Extremity/Trunk Assessment Upper Extremity Assessment Upper Extremity Assessment: Generalized weakness (R>L - baseline, decreased sensation at baseline too)   Lower Extremity Assessment Lower Extremity Assessment: Defer to PT evaluation        Vision       Perception     Praxis      Communication Communication Communication: No apparent difficulties   Cognition Arousal: Alert Behavior During Therapy: WFL for tasks assessed/performed Cognition: No apparent impairments             OT - Cognition Comments: funny, nice, good safety awareness                 Following commands: Intact        Cueing   Cueing Techniques: Verbal cues  Exercises      Shoulder Instructions       General Comments      Pertinent Vitals/ Pain       Pain Assessment Pain Assessment: Faces Faces Pain Scale: Hurts a little bit Pain Location: neck, incision site Pain Descriptors / Indicators: Aching, Sore Pain Intervention(s): Monitored during session, Repositioned  Home Living                                          Prior Functioning/Environment              Frequency  Min 2X/week        Progress Toward Goals  OT Goals(current goals can now be found in the care plan section)  Progress towards OT goals: Progressing toward goals  Acute Rehab OT Goals Patient Stated Goal: to get home OT Goal Formulation: With patient Time For Goal Achievement: 08/28/23 Potential to Achieve Goals: Good  Plan      Co-evaluation                 AM-PAC OT "6 Clicks" Daily Activity     Outcome Measure   Help from another person eating meals?: None Help from another person taking care of personal grooming?: A Little Help from another person toileting, which includes using toliet, bedpan, or urinal?: A Little Help from another person bathing (including washing, rinsing, drying)?: A Lot Help from another person to put on and taking off regular upper body clothing?: A Little Help from another person to put on and taking off regular lower body clothing?: A Lot 6 Click Score: 17    End of Session Equipment Utilized During Treatment: Gait belt;Rolling walker (2 wheels)  OT Visit Diagnosis: History of falling (Z91.81);Muscle weakness  (generalized) (M62.81);Pain Pain - part of body:  (cervical incision site)   Activity Tolerance Patient tolerated treatment well   Patient Left in bed;with call bell/phone within reach;with bed alarm set   Nurse Communication Mobility status        Time: 7829-5621 OT Time Calculation (min): 23 min  Charges: OT General Charges $OT Visit: 1 Visit OT Treatments $Self Care/Home Management : 8-22 mins $Therapeutic Activity: 8-22 mins  Chales [place] OTR/L Acute Rehabilitation Services Office: [phone]  Ebony Goldstein [facility] 08/18/2023, 2:02 PM

## 2023-08-18 NOTE — Progress Notes (Cosign Needed Addendum)
 [facility]  Date of Admission:  08/13/2023     Reason for Follow Up: Wound dehiscence, surgical  Total days of antibiotics 6         ASSESSMENT:  Natasha Lawson  E. coli is resistant to cefazolin  in the setting of MSSA/E. coli wound dehiscence status post posterior cervical wound exploration and hardware removal.  Discussed plan of care to change antibiotics to ertapenem for ease of dosing outpatient although slightly more broad.  PICC line appears to be positional in relation to functionality.  Discussed with IR given functioning PICC line despite being positional appropriate to continue use.  Home health/OPAT orders.  Postoperative wound care per neurosurgery.  Okay for discharge from ID standpoint once home health arranged.  Will arrange follow-up in ID clinic which can be virtually.  Continue standard/universal precautions remaining medical and supportive care per neurosurgery.   PLAN:  Change antibiotics to ertapenem 1 g IV daily PICC line care per protocol. Home health/OPAT orders. Postsurgical wound care per neurosurgery. Follow-up in ID clinic. Okay for discharge from ID standpoint once home health arranged Standard/universal precautions. Remaining medical and supportive care per primary team.  ADDENDUM: Notify that PICC line was no longer working. Discussed with IR and will re-evaluate for access.   Diagnosis: MSSA/E. coli C-spine wound infection status post debridement   Allergies  Allergen Reactions   Capoten [Captopril] Swelling    Tongue    Iodine Hives   Norvasc [Amlodipine Besylate] Swelling   Shellfish Allergy Hives   Terazosin Hcl Other (See Comments)    Caused a-fib   Xarelto [Rivaroxaban] Other (See Comments)    Severe back pain   Welchol [Colesevelam Hcl] Other (See Comments)    Confusion    Crestor [Rosuvastatin Calcium] Other (See Comments)    Muscle aches   Fenofibrate Other (See Comments)    Muscle aches    Metoprolol Palpitations   Pravachol [Pravastatin Sodium] Other (See Comments)    fatigue   Spironolactone Diarrhea   Statins Other (See Comments)    Joint pain    OPAT Orders Discharge antibiotics to be given via PICC line Discharge antibiotics: Ertapenem 1 g IV every 24 Per pharmacy protocol   Duration: 8 weeks  End Date: 10/08/23  [facility] Care Per Protocol:  Home health RN for IV administration and teaching; PICC line care and labs.    Labs weekly while on IV antibiotics: _X_ CBC with differential _X_ BMP __ CMP _X_ CRP _X_ ESR __ Vancomycin  trough __ CK  _X_ Please pull PIC at completion of IV antibiotics __ Please leave PIC in place until doctor has seen patient or been notified  Fax weekly labs to [phone]  Clinic Follow Up Appt:  09/10/2023 at 3:45 PM with Dr. Zelda Hickman  Principal Problem:   Wound dehiscence, surgical Active Problems:   Staph aureus infection   E coli infection    amiodarone   200 mg Oral Daily   Chlorhexidine  Gluconate Cloth  6 each Topical Daily   cyanocobalamin   500 mcg Oral Daily   docusate sodium   100 mg Oral BID   febuxostat   40 mg Oral Daily   ferrous sulfate   325 mg Oral BID WC   nebivolol   10 mg Oral Daily   pantoprazole   40 mg Oral Daily   potassium chloride   10 mEq Oral BID   sertraline   100 mg Oral Q breakfast   sodium chloride  flush  10-40 mL Intracatheter Q12H   sodium  chloride flush  3 mL Intravenous Q12H   torsemide   20 mg Oral Q breakfast   Warfarin - Pharmacist Dosing Inpatient   Does not apply q1600    SUBJECTIVE:  Afebrile overnight with no acute events.  Issues with PICC line noted.  Feeling good and ready to go home.  Has concerns about PICC line.  Tolerating antibiotics with no adverse side effects.  Allergies  Allergen Reactions   Capoten [Captopril] Swelling    Tongue    Iodine Hives   Norvasc [Amlodipine Besylate] Swelling   Shellfish Allergy Hives   Terazosin Hcl Other (See Comments)    Caused  a-fib   Xarelto [Rivaroxaban] Other (See Comments)    Severe back pain   Welchol [Colesevelam Hcl] Other (See Comments)    Confusion    Crestor [Rosuvastatin Calcium] Other (See Comments)    Muscle aches   Fenofibrate Other (See Comments)    Muscle aches   Metoprolol Palpitations   Pravachol [Pravastatin Sodium] Other (See Comments)    fatigue   Spironolactone Diarrhea   Statins Other (See Comments)    Joint pain     Review of Systems: Review of Systems  Constitutional:  Negative for chills, fever and weight loss.  Respiratory:  Negative for cough, shortness of breath and wheezing.   Cardiovascular:  Negative for chest pain and leg swelling.  Gastrointestinal:  Negative for abdominal pain, constipation, diarrhea, nausea and vomiting.  Skin:  Negative for rash.      OBJECTIVE: Vitals:   08/17/23 2012 08/18/23 0426 08/18/23 0842 08/18/23 1308  BP: (!) 119/50 (!) 127/50 (!) 149/93 (!) 158/79  Pulse: (!) 57 (!) 57 (!) 56 (!) 57  Resp: 18 18 16 14   Temp: 97.9 F (36.6 C) 98 F (36.7 C) 97.8 F (36.6 C) 98.1 F (36.7 C)  TempSrc: Oral Oral Oral Oral  SpO2: 97% 96% 98% 98%  Weight:      Height:       Body mass index is 35.24 kg/m.  Physical Exam Constitutional:      General: She is not in acute distress.    Appearance: She is well-developed.  Cardiovascular:     Rate and Rhythm: Normal rate and regular rhythm.     Heart sounds: Normal heart sounds.  Pulmonary:     Effort: Pulmonary effort is normal.     Breath sounds: Normal breath sounds.  Skin:    General: Skin is warm and dry.  Neurological:     Mental Status: She is alert and oriented to person, place, and time.  Psychiatric:        Behavior: Behavior normal.        Thought Content: Thought content normal.        Judgment: Judgment normal.     Lab Results Lab Results  Component Value Date   WBC 5.2 08/18/2023   HGB 9.2 (L) 08/18/2023   HCT 29.3 (L) 08/18/2023   MCV 101.7 (H) 08/18/2023   PLT  135 (L) 08/18/2023    Lab Results  Component Value Date   CREATININE 1.17 (H) 08/14/2023   BUN 19 08/14/2023   NA 141 08/14/2023   K 4.0 08/14/2023   CL 105 08/14/2023   CO2 23 08/14/2023    Lab Results  Component Value Date   ALT 15 06/25/2021   AST 17 06/25/2021   ALKPHOS 124 06/25/2021   BILITOT 0.6 06/25/2021     Microbiology: Recent Results (from the past 240 hours)  Aerobic/Anaerobic Culture  w Gram Stain (surgical/deep wound)     Status: None (Preliminary result)   Collection Time: 08/13/23  9:18 AM   Specimen: PATH Soft tissue  Result Value Ref Range Status   Specimen Description WOUND  Final   Special Requests POSTERIOR CERVICAL  Final   Gram Stain NO WBC SEEN NO ORGANISMS SEEN   Final   Culture   Final    NO GROWTH 4 DAYS NO ANAEROBES ISOLATED; CULTURE IN PROGRESS FOR 5 DAYS Performed at [facility], [address] [place], [place] [address]    Report Status PENDING  Incomplete  Aerobic/Anaerobic Culture w Gram Stain (surgical/deep wound)     Status: None (Preliminary result)   Collection Time: 08/13/23  9:38 AM   Specimen: PATH Soft tissue  Result Value Ref Range Status   Specimen Description TISSUE  Final   Special Requests SINUS TRACT  Final   Gram Stain   Final    RARE WBC PRESENT, PREDOMINANTLY PMN NO ORGANISMS SEEN    Culture   Final    RARE STAPHYLOCOCCUS AUREUS NO ANAEROBES ISOLATED; CULTURE IN PROGRESS FOR 5 DAYS    Report Status PENDING  Incomplete   Organism ID, Bacteria STAPHYLOCOCCUS AUREUS  Final      Susceptibility   Staphylococcus aureus - MIC*    CIPROFLOXACIN 1 SENSITIVE Sensitive     ERYTHROMYCIN <=0.25 SENSITIVE Sensitive     GENTAMICIN <=0.5 SENSITIVE Sensitive     OXACILLIN 0.5 SENSITIVE Sensitive     TETRACYCLINE <=1 SENSITIVE Sensitive     VANCOMYCIN  1 SENSITIVE Sensitive     TRIMETH/SULFA <=10 SENSITIVE Sensitive     CLINDAMYCIN <=0.25 SENSITIVE Sensitive     RIFAMPIN <=0.5 SENSITIVE Sensitive     Inducible  Clindamycin NEGATIVE Sensitive     LINEZOLID Value in next row Sensitive      2 SENSITIVEPerformed at [facility], [address] [place], [place] [address]    * RARE STAPHYLOCOCCUS AUREUS  Aerobic/Anaerobic Culture w Gram Stain (surgical/deep wound)     Status: None (Preliminary result)   Collection Time: 08/13/23  9:38 AM   Specimen: PATH Soft tissue  Result Value Ref Range Status   Specimen Description TISSUE  Final   Special Requests POSTERIOR CERVICAL INSTRUMENTATION  Final   Gram Stain NO WBC SEEN NO ORGANISMS SEEN   Final   Culture   Final    RARE ESCHERICHIA COLI NO ANAEROBES ISOLATED; CULTURE IN PROGRESS FOR 5 DAYS CULTURE REINCUBATED FOR BETTER GROWTH    Report Status PENDING  Incomplete   Organism ID, Bacteria ESCHERICHIA COLI  Final   Organism ID, Bacteria ESCHERICHIA COLI  Final      Susceptibility   Escherichia coli - MIC*    AMPICILLIN >=32 RESISTANT Resistant     CEFEPIME <=0.12 SENSITIVE Sensitive     CEFTAZIDIME <=1 SENSITIVE Sensitive     CEFTRIAXONE  <=0.25 SENSITIVE Sensitive     CIPROFLOXACIN <=0.25 SENSITIVE Sensitive     GENTAMICIN <=1 SENSITIVE Sensitive     IMIPENEM <=0.25 SENSITIVE Sensitive     TRIMETH/SULFA <=20 SENSITIVE Sensitive     AMPICILLIN/SULBACTAM 8 SENSITIVE Sensitive     PIP/TAZO <=4 SENSITIVE Sensitive ug/mL   Escherichia coli - KIRBY BAUER*    CEFAZOLIN  Value in next row Resistant      RESISTANTPerformed at [facility], [address] [place], [place] [address]    * RARE ESCHERICHIA COLI    RARE ESCHERICHIA COLI  Greg Zi Newbury, NP [facility]  08/18/2023  1:41 PM

## 2023-09-10 ENCOUNTER — Other Ambulatory Visit: Payer: Self-pay

## 2023-09-10 ENCOUNTER — Telehealth: Payer: Self-pay

## 2023-09-10 ENCOUNTER — Ambulatory Visit (INDEPENDENT_AMBULATORY_CARE_PROVIDER_SITE_OTHER): Payer: PRIVATE HEALTH INSURANCE | Admitting: Internal Medicine

## 2023-09-10 ENCOUNTER — Encounter: Payer: Self-pay | Admitting: Internal Medicine

## 2023-09-10 ENCOUNTER — Inpatient Hospital Stay: Payer: Self-pay | Admitting: Internal Medicine

## 2023-09-10 VITALS — BP 154/91 | HR 86

## 2023-09-10 DIAGNOSIS — G959 Disease of spinal cord, unspecified: Secondary | ICD-10-CM

## 2023-09-10 NOTE — Progress Notes (Signed)
 Patient: Natasha Lawson  DOB: Aug 28, 1940 MRN: [id number] PCP: Darnelle Elders, NP    Chief Complaint  Patient presents with   Hospitalization Follow-up     Patient Active Problem List   Diagnosis Date Noted   E coli infection 08/17/2023   Staph aureus infection 08/14/2023   Wound dehiscence, surgical 08/13/2023   Thrombocytopenia (HCC)    Iron  deficiency anemia    Steroid-induced hyperglycemia    HTN (hypertension) 04/05/2019   Quadriplegia (HCC) 04/05/2019   Atrial fibrillation (HCC) 04/05/2019   Anticoagulated on Coumadin  04/05/2019   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   Cervical myelopathy (HCC) 03/29/2019     Subjective:  Natasha Lawson is a 83 y.o. female with past medical history as below presents for hospital follow-up of deep tissue cervical wound infection in the setting of cervical fusion.  Taken to the OR on 5/1 for removal of cervical instrumentation revision of posterior cervical wound.  Per OR note there was a draining tract that terminated at the fascia, did not clearly communicate with the instrumentation.OR cultures grew MSSA and E. coli.  She was discharged on ertapenem  x 8 weeks from the OR EOT 6/26 Today 5/29: She has no new complaints.  Tolerating antibiotics no issues with PICC line reported.  Review of Systems  All other systems reviewed and are negative.   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     aspirin EC 81 MG tablet Take 81 mg by mouth daily. Swallow whole.     Docusate Sodium  (DSS)  100 MG CAPS Take 100 mg by mouth 2 (two) times daily as needed (constipation).     ertapenem  (INVANZ ) IVPB Inject 1 g into the vein daily. Indication:  MSSA/E coli cervical spine infection  First Dose: Yes Last Day of Therapy:  10/08/23 Labs - Once weekly:  CBC/D and BMP, Labs - Once weekly: ESR and CRP Method of administration: Mini-Bag Plus / Gravity Method of administration may be changed at the discretion of home infusion pharmacist based upon assessment of the patient and/or caregiver's ability to self-administer the medication ordered. 51 Units 0   febuxostat  (ULORIC ) 40 MG tablet Take 40 mg by mouth daily.     ferrous sulfate  325 (65 FE) MG tablet Take 325 mg by mouth 2 (two) times daily with a meal.     iron  polysaccharides (NIFEREX) 150 MG capsule Take 1 capsule (150 mg total) by mouth 2 (two) times daily with a meal. 60 capsule 0   methocarbamol  (ROBAXIN ) 500 MG tablet Take 1 tablet (500 mg total) by mouth every 6 (six) hours as needed for muscle spasms. 120 tablet 0   Multiple Vitamins-Minerals (ONCOVITE) TABS Take 1 tablet by mouth daily.     Multiple Vitamins-Minerals (PRESERVISION AREDS 2) CAPS Take 1 capsule by mouth in the morning and at bedtime.     nebivolol  (BYSTOLIC ) 10 MG tablet Take  10 mg by mouth daily.     omeprazole (PRILOSEC) 20 MG capsule Take 20 mg by mouth daily before breakfast.     oxyCODONE  (OXY IR/ROXICODONE ) 5 MG immediate release tablet Take 1 tablet (5 mg total) by mouth every 4 (four) hours as needed for moderate pain (pain score 4-6). 30 tablet 0   potassium chloride  (MICRO-K ) 10 MEQ CR capsule Take 10 mEq by mouth 2 (two) times daily.     PRESCRIPTION MEDICATION Pt uses a cpap nightly     sertraline  (ZOLOFT ) 100 MG tablet Take 100 mg by mouth daily with breakfast.      torsemide  (DEMADEX ) 20 MG tablet Take 20 mg by mouth daily with breakfast.      vitamin B-12 (CYANOCOBALAMIN ) 500 MCG tablet Take 500 mcg by mouth daily.     Vitamin D , Ergocalciferol ,  (DRISDOL ) 1.25 MG (50000 UT) CAPS capsule Take 50,000 Units by mouth every Friday.      warfarin (COUMADIN ) 2 MG tablet Take 1-2 mg by mouth at bedtime. Take 2 mg by mouth on Monday, Wednesday and Friday and take 1 mg on Tuesday, Thursday, Saturday and Sunday     No facility-administered medications prior to visit.     Allergies  Allergen Reactions   Capoten [Captopril] Swelling    Tongue    Iodine Hives   Norvasc [Amlodipine Besylate] Swelling   Shellfish Allergy Hives   Terazosin Hcl Other (See Comments)    Caused a-fib   Xarelto [Rivaroxaban] Other (See Comments)    Severe back pain   Welchol [Colesevelam Hcl] Other (See Comments)    Confusion    Crestor [Rosuvastatin Calcium] Other (See Comments)    Muscle aches   Fenofibrate Other (See Comments)    Muscle aches   Metoprolol Palpitations   Pravachol [Pravastatin Sodium] Other (See Comments)    fatigue   Spironolactone Diarrhea   Statins Other (See Comments)    Joint pain    Social History   Tobacco Use   Smoking status: Never   Smokeless tobacco: Never  Vaping Use   Vaping status: Never Used  Substance Use Topics   Alcohol use: Never   Drug use: Never    Family History  Problem Relation Age of Onset   Heart disease Mother    Cancer - Lung Father    Cancer Sister     Objective:  There were no vitals filed for this visit. There is no height or weight on file to calculate BMI.  Physical Exam Constitutional:      Appearance: Normal appearance.  HENT:     Head: Normocephalic and atraumatic.     Right Ear: Tympanic membrane normal.     Left Ear: Tympanic membrane normal.     Nose: Nose normal.     Mouth/Throat:     Mouth: Mucous membranes are moist.  Eyes:     Extraocular Movements: Extraocular movements intact.     Conjunctiva/sclera: Conjunctivae normal.     Pupils: Pupils are equal, round, and reactive to light.  Cardiovascular:     Rate and Rhythm: Normal rate and regular rhythm.     Heart  sounds: No murmur heard.    No friction rub. No gallop.  Pulmonary:     Effort: Pulmonary effort is normal.     Breath sounds: Normal breath sounds.  Abdominal:     General: Abdomen is flat.     Palpations: Abdomen is soft.  Musculoskeletal:        General:  Normal range of motion.  Skin:    General: Skin is warm and dry.  Neurological:     General: No focal deficit present.     Mental Status: She is alert and oriented to person, place, and time.  Psychiatric:        Mood and Affect: Mood normal.     Lab Results: Lab Results  Component Value Date   WBC 5.2 08/18/2023   HGB 9.2 (L) 08/18/2023   HCT 29.3 (L) 08/18/2023   MCV 101.7 (H) 08/18/2023   PLT 135 (L) 08/18/2023    Lab Results  Component Value Date   CREATININE 1.17 (H) 08/14/2023   BUN 19 08/14/2023   NA 141 08/14/2023   K 4.0 08/14/2023   CL 105 08/14/2023   CO2 23 08/14/2023    Lab Results  Component Value Date   ALT 15 06/25/2021   AST 17 06/25/2021   ALKPHOS 124 06/25/2021   BILITOT 0.6 06/25/2021     Assessment & Plan:   # Deep tissue cervical wound in the setting of cervical fusion  - Taken to the OR in 5/1 with neurosurgery for removal of cervical instrumentation and revision of posterior cervical wound.   -Per OR note there was a draining tract that terminated at the fascia, did not clearly communicate with the instrumentation. -OR cultures + MSSA and E. coli.  Patient transitioned to Unasyn  inpatient then ertapenem  to complete 8-week IV antibiotic course eot 6/26 given ease of dosing -Pt doing well, with abx.  Cervical wound peers to be healing well -Seen by nsy(Dr. Andy Bannister) on 5/19 - F/U on 6/26 with ID  #Medication management #PICC->no issues today -Called [facility] for labs    Orlie Bjornstad, MD [facility]   09/10/23  3:44 PM I have personally spent 35 minutes involved in face-to-face and non-face-to-face activities for this  patient on the day of the visit. Professional time spent includes the following activities: Preparing to see the patient (review of tests), Obtaining and/or reviewing separately obtained history (admission/discharge record), Performing a medically appropriate examination and/or evaluation , Ordering medications/tests/procedures, referring and communicating with other health care professionals, Documenting clinical information in the EMR, Independently interpreting results (not separately reported), Communicating results to the patient/family/caregiver, Counseling and educating the patient/family/caregiver and Care coordination (not separately reported).

## 2023-09-10 NOTE — Telephone Encounter (Signed)
 Patient will like a call back about lab work once fax is sent from [facility] [place] . Provider was notified.

## 2023-09-11 ENCOUNTER — Telehealth: Payer: Self-pay | Admitting: Internal Medicine

## 2023-09-11 NOTE — Telephone Encounter (Signed)
 5/20 labs stable: wbc: 4.3, scr 1/05, esr 19,  <0.5. conveyed stable labs to patient

## 2023-09-11 NOTE — Telephone Encounter (Signed)
 Patient called regarding results.  Results emailed to Dr. Zelda Hickman for her to review and call the patient.  Cyree Chuong Adel Holt, CMA

## 2023-10-06 ENCOUNTER — Other Ambulatory Visit: Payer: Self-pay

## 2023-10-06 ENCOUNTER — Ambulatory Visit (INDEPENDENT_AMBULATORY_CARE_PROVIDER_SITE_OTHER): Admitting: Internal Medicine

## 2023-10-06 ENCOUNTER — Encounter: Payer: Self-pay | Admitting: Internal Medicine

## 2023-10-06 ENCOUNTER — Telehealth: Payer: Self-pay

## 2023-10-06 VITALS — BP 142/76 | HR 46 | Temp 97.4°F

## 2023-10-06 DIAGNOSIS — G959 Disease of spinal cord, unspecified: Secondary | ICD-10-CM | POA: Diagnosis present

## 2023-10-06 NOTE — Telephone Encounter (Signed)
 Patient called to confirm that pull PICC orders were sent to [facility]. Notified her that orders were sent to [facility] who will communicate with [place]  office.   Natasha Lawson, BSN, RN

## 2023-10-06 NOTE — Progress Notes (Signed)
 Patient: Natasha Lawson  DOB: 07-03-1940 MRN: [id number] PCP: Kristine Corean Deed, NP    Chief Complaint  Patient presents with   Follow-up     Patient Active Problem List   Diagnosis Date Noted   E coli infection 08/17/2023   Staph aureus infection 08/14/2023   Wound dehiscence, surgical 08/13/2023   Thrombocytopenia (HCC)    Iron  deficiency anemia    Steroid-induced hyperglycemia    HTN (hypertension) 04/05/2019   Quadriplegia (HCC) 04/05/2019   Atrial fibrillation (HCC) 04/05/2019   Anticoagulated on Coumadin  04/05/2019   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   Cervical myelopathy (HCC) 03/29/2019     Subjective:  Natasha Lawson is a 83 y.o. female with past medical history as below presents for hospital follow-up of deep tissue cervical wound infection in the setting of cervical fusion.  Taken to the OR on 5/1 for removal of cervical instrumentation revision of posterior cervical wound.  Per OR note there was a draining tract that terminated at the fascia, did not clearly communicate with the instrumentation.OR cultures grew MSSA and E. coli.  She was discharged on ertapenem  x 8 weeks from the OR EOT 6/26 Today 5/29: She has no new complaints.  Tolerating antibiotics no issues with PICC line reported.  Review of Systems  All other systems reviewed and are negative.   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     aspirin EC 81 MG tablet Take 81 mg by mouth daily. Swallow whole.     ertapenem  (INVANZ ) IVPB Inject 1 g into  the vein daily. Indication:  MSSA/E coli cervical spine infection  First Dose: Yes Last Day of Therapy:  10/08/23 Labs - Once weekly:  CBC/D and BMP, Labs - Once weekly: ESR and CRP Method of administration: Mini-Bag Plus / Gravity Method of administration may be changed at the discretion of home infusion pharmacist based upon assessment of the patient and/or caregiver's ability to self-administer the medication ordered. 51 Units 0   febuxostat  (ULORIC ) 40 MG tablet Take 40 mg by mouth daily.     ferrous sulfate  325 (65 FE) MG tablet Take 325 mg by mouth 2 (two) times daily with a meal.     Multiple Vitamins-Minerals (ONCOVITE) TABS Take 1 tablet by mouth daily.     nebivolol  (BYSTOLIC ) 10 MG tablet Take 10 mg by mouth daily.     omeprazole (PRILOSEC) 20 MG capsule Take 20 mg by mouth daily before breakfast.     potassium chloride  (MICRO-K ) 10 MEQ CR capsule Take 10 mEq by mouth 2 (two) times daily.     sertraline  (ZOLOFT ) 100 MG tablet Take 100 mg by mouth daily with breakfast.      torsemide  (DEMADEX ) 20 MG tablet Take 20 mg by mouth daily with breakfast.      vitamin B-12 (CYANOCOBALAMIN ) 500 MCG tablet Take 500 mcg by mouth daily.  Vitamin D , Ergocalciferol , (DRISDOL ) 1.25 MG (50000 UT) CAPS capsule Take 50,000 Units by mouth every Friday.      warfarin (COUMADIN ) 2 MG tablet Take 1-2 mg by mouth at bedtime. Take 2 mg by mouth on Monday, Wednesday and Friday and take 1 mg on Tuesday, Thursday, Saturday and Sunday     Docusate Sodium  (DSS) 100 MG CAPS Take 100 mg by mouth 2 (two) times daily as needed (constipation). (Patient not taking: Reported on 10/06/2023)     iron  polysaccharides (NIFEREX) 150 MG capsule Take 1 capsule (150 mg total) by mouth 2 (two) times daily with a meal. (Patient not taking: Reported on 10/06/2023) 60 capsule 0   methocarbamol  (ROBAXIN ) 500 MG tablet Take 1 tablet (500 mg total) by mouth every 6 (six) hours as needed for muscle spasms. (Patient not taking: Reported  on 10/06/2023) 120 tablet 0   Multiple Vitamins-Minerals (PRESERVISION AREDS 2) CAPS Take 1 capsule by mouth in the morning and at bedtime.     oxyCODONE  (OXY IR/ROXICODONE ) 5 MG immediate release tablet Take 1 tablet (5 mg total) by mouth every 4 (four) hours as needed for moderate pain (pain score 4-6). (Patient not taking: Reported on 10/06/2023) 30 tablet 0   PRESCRIPTION MEDICATION Pt uses a cpap nightly     No facility-administered medications prior to visit.     Allergies  Allergen Reactions   Capoten [Captopril] Swelling    Tongue    Iodine Hives   Norvasc [Amlodipine Besylate] Swelling   Shellfish Allergy Hives   Terazosin Hcl Other (See Comments)    Caused a-fib   Xarelto [Rivaroxaban] Other (See Comments)    Severe back pain   Welchol [Colesevelam Hcl] Other (See Comments)    Confusion    Crestor [Rosuvastatin Calcium] Other (See Comments)    Muscle aches   Fenofibrate Other (See Comments)    Muscle aches   Metoprolol Palpitations   Pravachol [Pravastatin Sodium] Other (See Comments)    fatigue   Spironolactone Diarrhea   Statins Other (See Comments)    Joint pain    Social History   Tobacco Use   Smoking status: Never   Smokeless tobacco: Never  Vaping Use   Vaping status: Never Used  Substance Use Topics   Alcohol use: Never   Drug use: Never    Family History  Problem Relation Age of Onset   Heart disease Mother    Cancer - Lung Father    Cancer Sister     Objective:   Vitals:   10/06/23 0835  BP: (!) 142/76  Pulse: (!) 46  Temp: (!) 97.4 F (36.3 C)  TempSrc: Oral  SpO2: 95%   There is no height or weight on file to calculate BMI.  Physical Exam Constitutional:      Appearance: Normal appearance.  HENT:     Head: Normocephalic and atraumatic.     Right Ear: Tympanic membrane normal.     Left Ear: Tympanic membrane normal.     Nose: Nose normal.     Mouth/Throat:     Mouth: Mucous membranes are moist.   Eyes:     Extraocular  Movements: Extraocular movements intact.     Conjunctiva/sclera: Conjunctivae normal.     Pupils: Pupils are equal, round, and reactive to light.    Cardiovascular:     Rate and Rhythm: Normal rate and regular rhythm.     Heart sounds: No murmur heard.    No friction rub. No gallop.  Pulmonary:  Effort: Pulmonary effort is normal.     Breath sounds: Normal breath sounds.  Abdominal:     General: Abdomen is flat.     Palpations: Abdomen is soft.   Musculoskeletal:        General: Normal range of motion.   Skin:    General: Skin is warm and dry.   Neurological:     General: No focal deficit present.     Mental Status: She is alert and oriented to person, place, and time.   Psychiatric:        Mood and Affect: Mood normal.     Lab Results: Lab Results  Component Value Date   WBC 5.2 08/18/2023   HGB 9.2 (L) 08/18/2023   HCT 29.3 (L) 08/18/2023   MCV 101.7 (H) 08/18/2023   PLT 135 (L) 08/18/2023    Lab Results  Component Value Date   CREATININE 1.17 (H) 08/14/2023   BUN 19 08/14/2023   NA 141 08/14/2023   K 4.0 08/14/2023   CL 105 08/14/2023   CO2 23 08/14/2023    Lab Results  Component Value Date   ALT 15 06/25/2021   AST 17 06/25/2021   ALKPHOS 124 06/25/2021   BILITOT 0.6 06/25/2021    Assessment & Plan:   #Deep tissue cervical wound in the setting of cervical fusion w/o HW involvement - Taken to the OR in 5/1 with neurosurgery for removal of cervical instrumentation and revision of posterior cervical wound.   -Per OR note there was a draining tract that terminated at the fascia, did not clearly communicate with the instrumentation. -OR cultures + MSSA and E. coli.  Patient transitioned to Unasyn  inpatient then ertapenem  to complete 8-week IV antibiotic course eot 6/26 given ease of dosing -Seen by nsy(Dr. debby) last week, pt plans to got to PT. -wound healed - F/U ID in one month   #Medication management #PICC->no issues today Labs: wbc 4.5k,  Cr 1.15, crp <0.5 on 10/01/23 Pull picc stop abx after last does   Loney Stank, MD [facility] for Infectious Disease [facility] I have personally spent 42 minutes involved in face-to-face and non-face-to-face activities for this patient on the day of the visit. Professional time spent includes the following activities: Preparing to see the patient (review of tests), Obtaining and/or reviewing separately obtained history (admission/discharge record), Performing a medically appropriate examination and/or evaluation , Ordering medications/tests/procedures, referring and communicating with other health care professionals, Documenting clinical information in the EMR, Independently interpreting results (not separately reported), Communicating results to the patient/family/caregiver, Counseling and educating the patient/family/caregiver and Care coordination (not separately reported).     10/06/23  8:59 AM

## 2023-10-06 NOTE — Telephone Encounter (Signed)
 Message sent to Holley Herring, RN with [employer] requesting that [place]  office fax labs to triage.   Duard Spiewak, BSN, RN

## 2023-10-06 NOTE — Telephone Encounter (Signed)
 Per Dr. Dennise, okay to pull PICC after last dose on 6/26. Orders sent to Holley Herring, RN with [employer] to forward to [facility] location.   Lilias Lorensen, BSN, RN

## 2023-10-08 ENCOUNTER — Ambulatory Visit: Payer: Self-pay | Admitting: Internal Medicine

## 2023-10-21 ENCOUNTER — Other Ambulatory Visit (HOSPITAL_COMMUNITY): Payer: Self-pay

## 2023-11-09 ENCOUNTER — Ambulatory Visit (INDEPENDENT_AMBULATORY_CARE_PROVIDER_SITE_OTHER): Payer: PRIVATE HEALTH INSURANCE | Admitting: Internal Medicine

## 2023-11-09 ENCOUNTER — Encounter: Payer: Self-pay | Admitting: Internal Medicine

## 2023-11-09 ENCOUNTER — Other Ambulatory Visit: Payer: Self-pay

## 2023-11-09 VITALS — BP 135/68 | HR 47 | Temp 97.7°F

## 2023-11-09 DIAGNOSIS — G959 Disease of spinal cord, unspecified: Secondary | ICD-10-CM

## 2023-11-09 NOTE — Progress Notes (Signed)
 /         Patient Active Problem List   Diagnosis Date Noted   E coli infection 08/17/2023   Staph aureus infection 08/14/2023   Wound dehiscence, surgical 08/13/2023   Thrombocytopenia (HCC)    Iron  deficiency anemia    Steroid-induced hyperglycemia    HTN (hypertension) 04/05/2019   Quadriplegia (HCC) 04/05/2019   Atrial fibrillation (HCC) 04/05/2019   Anticoagulated on Coumadin  04/05/2019   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   Cervical myelopathy (HCC) 03/29/2019    Patient's Medications  New Prescriptions   No medications on file  Previous Medications   AMIODARONE  (PACERONE ) 200 MG TABLET    Take 200 mg by mouth daily.   ASPIRIN EC 81 MG TABLET    Take 81 mg by mouth daily. Swallow whole.   DOCUSATE SODIUM  (DSS) 100 MG CAPS    Take 100 mg by mouth 2 (two) times daily as needed (constipation).   FEBUXOSTAT  (ULORIC ) 40 MG TABLET    Take 40 mg by mouth daily.   FERROUS SULFATE  325 (65 FE) MG TABLET    Take 325 mg by mouth 2 (two) times daily with a meal.   IRON  POLYSACCHARIDES (NIFEREX) 150 MG CAPSULE    Take 1 capsule (150 mg total) by mouth 2 (two) times daily with a meal.   METHOCARBAMOL  (ROBAXIN ) 500 MG TABLET    Take 1 tablet (500 mg total) by mouth every 6 (six) hours as needed for muscle spasms.   MULTIPLE VITAMINS-MINERALS (ONCOVITE) TABS    Take 1 tablet by mouth daily.   MULTIPLE VITAMINS-MINERALS (PRESERVISION AREDS 2) CAPS    Take 1 capsule by mouth in the morning and at bedtime.   NEBIVOLOL  (BYSTOLIC ) 10 MG TABLET    Take 10 mg by mouth daily.   OMEPRAZOLE (PRILOSEC) 20 MG CAPSULE    Take 20 mg by mouth daily before breakfast.   OXYCODONE  (OXY IR/ROXICODONE ) 5 MG IMMEDIATE RELEASE TABLET    Take 1 tablet (5 mg total) by mouth every 4 (four) hours as needed for moderate pain (pain score 4-6).   POTASSIUM CHLORIDE  (MICRO-K ) 10 MEQ CR CAPSULE    Take 10 mEq by mouth 2 (two) times daily.   PRESCRIPTION MEDICATION    Pt uses a cpap nightly   SERTRALINE   (ZOLOFT ) 100 MG TABLET    Take 100 mg by mouth daily with breakfast.    TORSEMIDE  (DEMADEX ) 20 MG TABLET    Take 20 mg by mouth daily with breakfast.    VITAMIN B-12 (CYANOCOBALAMIN ) 500 MCG TABLET    Take 500 mcg by mouth daily.   VITAMIN D , ERGOCALCIFEROL , (DRISDOL ) 1.25 MG (50000 UT) CAPS CAPSULE    Take 50,000 Units by mouth every Friday.    WARFARIN (COUMADIN ) 2 MG TABLET    Take 1-2 mg by mouth at bedtime. Take 2 mg by mouth on Monday, Wednesday and Friday and take 1 mg on Tuesday, Thursday, Saturday and Sunday  Modified Medications   No medications on file  Discontinued Medications   No medications on file    Subjective: Natasha Lawson is a 82 y.o. female with past medical history as below presents for hospital follow-up of deep tissue cervical wound infection in the setting of cervical fusion.  Taken to the OR on 5/1 for removal of cervical instrumentation revision of posterior cervical wound.  Per OR note there was a draining tract that terminated at the fascia, did not clearly communicate with the instrumentation.OR cultures grew MSSA and  E. coli.  She was discharged on ertapenem  x 8 weeks from the OR EOT 6/26  5/29: She has no new complaints.  Tolerating antibiotics no issues with PICC line reported. Today 11/09/23: Pt states pain is about the same. Has not gone to PT. PT is seeing cardiologist for afib.  Review of Systems: Review of Systems  All other systems reviewed and are negative.   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   History of blood transfusion    Hypertension    Sleep apnea    uses CPAP nightly    Social History   Tobacco Use   Smoking status: Never   Smokeless tobacco: Never  Vaping Use   Vaping status: Never Used  Substance Use Topics    Alcohol use: Never   Drug use: Never    Family History  Problem Relation Age of Onset   Heart disease Mother    Cancer - Lung Father    Cancer Sister     Allergies  Allergen Reactions   Capoten [Captopril] Swelling    Tongue    Iodine Hives   Norvasc [Amlodipine Besylate] Swelling   Shellfish Allergy Hives   Terazosin Hcl Other (See Comments)    Caused a-fib   Xarelto [Rivaroxaban] Other (See Comments)    Severe back pain   Welchol [Colesevelam Hcl] Other (See Comments)    Confusion    Crestor [Rosuvastatin Calcium] Other (See Comments)    Muscle aches   Fenofibrate Other (See Comments)    Muscle aches   Metoprolol Palpitations   Pravachol [Pravastatin Sodium] Other (See Comments)    fatigue   Spironolactone Diarrhea   Statins Other (See Comments)    Joint pain    Health Maintenance  Topic Date Due   Medicare Annual Wellness (AWV)  Never done   DTaP/Tdap/Td (1 - Tdap) Never done   DEXA SCAN  Never done   COVID-19 Vaccine (8 - Moderna risk 2024-25 season) 07/16/2023   INFLUENZA VACCINE  11/13/2023   Pneumococcal Vaccine: 50+ Years  Completed   Zoster Vaccines- Shingrix  Completed   Hepatitis B Vaccines  Aged Out   HPV VACCINES  Aged Out   Meningococcal B Vaccine  Aged Out    Objective:  There were no vitals filed for this visit. There is no height or weight on file to calculate BMI.  Physical Exam Constitutional:      Appearance: Normal appearance.  HENT:     Head: Normocephalic and atraumatic.     Right Ear: Tympanic membrane normal.     Left Ear: Tympanic membrane normal.     Nose: Nose normal.     Mouth/Throat:     Mouth: Mucous membranes are moist.  Eyes:     Extraocular Movements: Extraocular movements intact.     Conjunctiva/sclera: Conjunctivae normal.     Pupils: Pupils are equal, round, and reactive to light.  Cardiovascular:     Rate and Rhythm: Normal rate and regular rhythm.     Heart sounds: No murmur heard.    No friction rub. No  gallop.  Pulmonary:     Effort: Pulmonary effort is normal.  Breath sounds: Normal breath sounds.  Abdominal:     General: Abdomen is flat.     Palpations: Abdomen is soft.  Skin:    General: Skin is warm and dry.  Neurological:     General: No focal deficit present.     Mental Status: She is alert and oriented to person, place, and time.  Psychiatric:        Mood and Affect: Mood normal.   Neck scar healed Lab Results Lab Results  Component Value Date   WBC 5.2 08/18/2023   HGB 9.2 (L) 08/18/2023   HCT 29.3 (L) 08/18/2023   MCV 101.7 (H) 08/18/2023   PLT 135 (L) 08/18/2023    Lab Results  Component Value Date   CREATININE 1.17 (H) 08/14/2023   BUN 19 08/14/2023   NA 141 08/14/2023   K 4.0 08/14/2023   CL 105 08/14/2023   CO2 23 08/14/2023    Lab Results  Component Value Date   ALT 15 06/25/2021   AST 17 06/25/2021   ALKPHOS 124 06/25/2021   BILITOT 0.6 06/25/2021    No results found for: CHOL, HDL, LDLCALC, LDLDIRECT, TRIG, CHOLHDL No results found for: LABRPR, RPRTITER No results found for: HIV1RNAQUANT, HIV1RNAVL, CD4TABS   Problem List Items Addressed This Visit   None  Results   Assessment/Plan  #Deep tissue cervical wound in the setting of cervical fusion w/o HW involvement - Taken to the OR in 5/1 with neurosurgery for removal of cervical instrumentation and revision of posterior cervical wound.   -Per OR note there was a draining tract that terminated at the fascia, did not clearly communicate with the instrumentation. -OR cultures + MSSA and E. coli.  Patient transitioned to Unasyn  inpatient then ertapenem  to complete 8-week IV antibiotic course eot 6/26 given ease of dosing -Seen by nsy(Dr. debby) last week, pt planned to got to PT. She has not gone yet.  -wound healed Plan Completed abx EOT 6/26 Labs off of abx Agree with PT Follow up with ID prn   #Medication management #PICC->pulled after last  dose     Natasha Langan, MD [facility] for Infectious Disease [facility] 11/09/2023, 5:34 AM   I have personally spent 42 minutes involved in face-to-face and non-face-to-face activities for this patient on the day of the visit. Professional time spent includes the following activities: Preparing to see the patient (review of tests), Obtaining and/or reviewing separately obtained history (admission/discharge record), Performing a medically appropriate examination and/or evaluation , Ordering medications/tests/procedures, referring and communicating with other health care professionals, Documenting clinical information in the EMR, Independently interpreting results (not separately reported), Communicating results to the patient/family/caregiver, Counseling and educating the patient/family/caregiver and Care coordination (not separately reported).  e

## 2023-11-10 LAB — COMPLETE METABOLIC PANEL WITHOUT GFR
AG Ratio: 1.5 (calc) (ref 1.0–2.5)
ALT: 22 U/L (ref 6–29)
AST: 24 U/L (ref 10–35)
Albumin: 3.7 g/dL (ref 3.6–5.1)
Alkaline phosphatase (APISO): 99 U/L (ref 37–153)
BUN/Creatinine Ratio: 27 (calc) — ABNORMAL HIGH (ref 6–22)
BUN: 30 mg/dL — ABNORMAL HIGH (ref 7–25)
CO2: 27 mmol/L (ref 20–32)
Calcium: 8.9 mg/dL (ref 8.6–10.4)
Chloride: 107 mmol/L (ref 98–110)
Creat: 1.12 mg/dL — ABNORMAL HIGH (ref 0.60–0.95)
Globulin: 2.4 g/dL (ref 1.9–3.7)
Glucose, Bld: 93 mg/dL (ref 65–99)
Potassium: 4.4 mmol/L (ref 3.5–5.3)
Sodium: 143 mmol/L (ref 135–146)
Total Bilirubin: 0.6 mg/dL (ref 0.2–1.2)
Total Protein: 6.1 g/dL (ref 6.1–8.1)

## 2023-11-10 LAB — C-REACTIVE PROTEIN: CRP: 3.1 mg/L (ref <8.0)

## 2023-11-10 LAB — CBC WITH DIFFERENTIAL/PLATELET
Absolute Lymphocytes: 905 {cells}/uL (ref 850–3900)
Absolute Monocytes: 577 {cells}/uL (ref 200–950)
Basophils Absolute: 52 {cells}/uL (ref 0–200)
Basophils Relative: 1 %
Eosinophils Absolute: 120 {cells}/uL (ref 15–500)
Eosinophils Relative: 2.3 %
HCT: 30.1 % — ABNORMAL LOW (ref 35.0–45.0)
Hemoglobin: 9.5 g/dL — ABNORMAL LOW (ref 11.7–15.5)
MCH: 31.3 pg (ref 27.0–33.0)
MCHC: 31.6 g/dL — ABNORMAL LOW (ref 32.0–36.0)
MCV: 99 fL (ref 80.0–100.0)
MPV: 11.4 fL (ref 7.5–12.5)
Monocytes Relative: 11.1 %
Neutro Abs: 3546 {cells}/uL (ref 1500–7800)
Neutrophils Relative %: 68.2 %
Platelets: 132 Thousand/uL — ABNORMAL LOW (ref 140–400)
RBC: 3.04 Million/uL — ABNORMAL LOW (ref 3.80–5.10)
RDW: 14.6 % (ref 11.0–15.0)
Total Lymphocyte: 17.4 %
WBC: 5.2 Thousand/uL (ref 3.8–10.8)

## 2023-11-10 LAB — SEDIMENTATION RATE: Sed Rate: 29 mm/h (ref 0–30)
